# Patient Record
Sex: Male | Born: 1950 | Race: White | Hispanic: No | Marital: Married | Smoking: Former smoker
Health system: Southern US, Community
[De-identification: ages and names within clinical notes are randomized; demographics above are authoritative.]

## PROBLEM LIST (undated history)

## (undated) DIAGNOSIS — C801 Malignant (primary) neoplasm, unspecified: Secondary | ICD-10-CM

## (undated) DIAGNOSIS — F329 Major depressive disorder, single episode, unspecified: Secondary | ICD-10-CM

## (undated) DIAGNOSIS — G4733 Obstructive sleep apnea (adult) (pediatric): Secondary | ICD-10-CM

## (undated) DIAGNOSIS — I2699 Other pulmonary embolism without acute cor pulmonale: Secondary | ICD-10-CM

## (undated) DIAGNOSIS — R972 Elevated prostate specific antigen [PSA]: Secondary | ICD-10-CM

## (undated) DIAGNOSIS — R7303 Prediabetes: Secondary | ICD-10-CM

## (undated) DIAGNOSIS — G4730 Sleep apnea, unspecified: Secondary | ICD-10-CM

## (undated) DIAGNOSIS — H409 Unspecified glaucoma: Secondary | ICD-10-CM

## (undated) DIAGNOSIS — F32A Depression, unspecified: Secondary | ICD-10-CM

## (undated) DIAGNOSIS — K219 Gastro-esophageal reflux disease without esophagitis: Secondary | ICD-10-CM

## (undated) DIAGNOSIS — R51 Headache: Secondary | ICD-10-CM

## (undated) DIAGNOSIS — L409 Psoriasis, unspecified: Secondary | ICD-10-CM

## (undated) DIAGNOSIS — J302 Other seasonal allergic rhinitis: Secondary | ICD-10-CM

## (undated) DIAGNOSIS — I10 Essential (primary) hypertension: Secondary | ICD-10-CM

## (undated) DIAGNOSIS — Z77090 Contact with and (suspected) exposure to asbestos: Secondary | ICD-10-CM

## (undated) HISTORY — PX: APPENDECTOMY: SHX54

## (undated) HISTORY — PX: PROSTATE BIOPSY: SHX241

## (undated) HISTORY — PX: TONSILLECTOMY: SUR1361

## (undated) HISTORY — PX: EXTERNAL EAR SURGERY: SHX627

---
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
## 2000-08-09 HISTORY — PX: RETINAL DETACHMENT SURGERY: SHX105

## 2000-09-12 ENCOUNTER — Encounter: Payer: Self-pay | Admitting: Ophthalmology

## 2000-09-12 ENCOUNTER — Ambulatory Visit (HOSPITAL_COMMUNITY): Admission: RE | Admit: 2000-09-12 | Discharge: 2000-09-13 | Payer: Self-pay | Admitting: Ophthalmology

## 2002-05-04 ENCOUNTER — Ambulatory Visit (HOSPITAL_COMMUNITY): Admission: RE | Admit: 2002-05-04 | Discharge: 2002-05-04 | Payer: Self-pay | Admitting: *Deleted

## 2002-05-04 ENCOUNTER — Encounter (INDEPENDENT_AMBULATORY_CARE_PROVIDER_SITE_OTHER): Payer: Self-pay | Admitting: Specialist

## 2002-08-09 HISTORY — PX: RETINAL DETACHMENT SURGERY: SHX105

## 2002-10-25 ENCOUNTER — Encounter: Payer: Self-pay | Admitting: Ophthalmology

## 2002-10-25 ENCOUNTER — Ambulatory Visit (HOSPITAL_COMMUNITY): Admission: RE | Admit: 2002-10-25 | Discharge: 2002-10-26 | Payer: Self-pay | Admitting: Ophthalmology

## 2004-07-16 ENCOUNTER — Ambulatory Visit (HOSPITAL_COMMUNITY): Admission: RE | Admit: 2004-07-16 | Discharge: 2004-07-16 | Payer: Self-pay | Admitting: Orthopedic Surgery

## 2004-08-09 HISTORY — PX: SHOULDER ARTHROSCOPY: SHX128

## 2004-09-17 ENCOUNTER — Ambulatory Visit (HOSPITAL_BASED_OUTPATIENT_CLINIC_OR_DEPARTMENT_OTHER): Admission: RE | Admit: 2004-09-17 | Discharge: 2004-09-17 | Payer: Self-pay | Admitting: Orthopedic Surgery

## 2004-09-17 ENCOUNTER — Ambulatory Visit (HOSPITAL_COMMUNITY): Admission: RE | Admit: 2004-09-17 | Discharge: 2004-09-17 | Payer: Self-pay | Admitting: Orthopedic Surgery

## 2004-10-30 ENCOUNTER — Encounter: Admission: RE | Admit: 2004-10-30 | Discharge: 2004-10-30 | Payer: Self-pay | Admitting: Orthopedic Surgery

## 2006-08-09 HISTORY — PX: CARPAL TUNNEL WITH CUBITAL TUNNEL: SHX5608

## 2007-08-01 ENCOUNTER — Ambulatory Visit (HOSPITAL_BASED_OUTPATIENT_CLINIC_OR_DEPARTMENT_OTHER): Admission: RE | Admit: 2007-08-01 | Discharge: 2007-08-01 | Payer: Self-pay | Admitting: Orthopedic Surgery

## 2007-08-10 HISTORY — PX: CARPAL TUNNEL WITH CUBITAL TUNNEL: SHX5608

## 2008-07-30 ENCOUNTER — Ambulatory Visit (HOSPITAL_BASED_OUTPATIENT_CLINIC_OR_DEPARTMENT_OTHER): Admission: RE | Admit: 2008-07-30 | Discharge: 2008-07-30 | Payer: Self-pay | Admitting: Orthopedic Surgery

## 2009-10-26 ENCOUNTER — Emergency Department (HOSPITAL_BASED_OUTPATIENT_CLINIC_OR_DEPARTMENT_OTHER): Admission: EM | Admit: 2009-10-26 | Discharge: 2009-10-26 | Payer: Self-pay | Admitting: Emergency Medicine

## 2010-05-18 ENCOUNTER — Emergency Department (HOSPITAL_COMMUNITY): Admission: EM | Admit: 2010-05-18 | Discharge: 2010-05-19 | Payer: Self-pay | Admitting: Emergency Medicine

## 2010-10-21 LAB — CBC
MCH: 29.6 pg (ref 26.0–34.0)
MCV: 87.5 fL (ref 78.0–100.0)
Platelets: 243 10*3/uL (ref 150–400)
RBC: 4.79 MIL/uL (ref 4.22–5.81)
RDW: 13.1 % (ref 11.5–15.5)

## 2010-10-21 LAB — DIFFERENTIAL
Basophils Absolute: 0 10*3/uL (ref 0.0–0.1)
Basophils Relative: 0 % (ref 0–1)
Eosinophils Absolute: 0.2 10*3/uL (ref 0.0–0.7)
Eosinophils Relative: 3 % (ref 0–5)
Neutrophils Relative %: 52 % (ref 43–77)

## 2010-10-21 LAB — BASIC METABOLIC PANEL
BUN: 16 mg/dL (ref 6–23)
CO2: 26 mEq/L (ref 19–32)
Calcium: 9.1 mg/dL (ref 8.4–10.5)
Chloride: 103 mEq/L (ref 96–112)
Creatinine, Ser: 1.08 mg/dL (ref 0.4–1.5)
GFR calc Af Amer: >60 mL/min (ref >60)
GFR calc non Af Amer: >60 mL/min (ref >60)
Glucose, Bld: 97 mg/dL (ref 70–99)
Potassium: 3.9 mEq/L (ref 3.5–5.1)
Sodium: 136 mEq/L (ref 135–145)

## 2010-10-21 LAB — POCT CARDIAC MARKERS
Myoglobin, poc: 44.6 ng/mL (ref 12–200)
Troponin i, poc: <0.05 ng/mL (ref 0.00–0.09)

## 2010-12-22 NOTE — Op Note (Signed)
NAMEDRAVYN, SEVERS          ACCOUNT NO.:  [id number]   MEDICAL RECORD NO.:  [id number]          PATIENT TYPE:  AMB   LOCATION:  DSC                          FACILITY:  [facility]   PHYSICIAN:  Katy Fitch. Sypher, M.D. DATE OF BIRTH:  [birth year]   DATE OF PROCEDURE:  08/01/2007  DATE OF DISCHARGE:                               OPERATIVE REPORT   PREOPERATIVE DIAGNOSES:  1. 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  2. Right carpal tunnel syndrome.   POSTOPERATIVE DIAGNOSES:  1. 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  2. Right carpal tunnel syndrome.   OPERATION:  1. Decompression of right ulnar nerve in situ right cubital tunnel      with partial resection of medial head of triceps and triceps      aponeurosis  2. Right carpal tunnel release at wrist.   OPERATING SURGEON:  Katy Fitch. Sypher, M.D.   ASSISTANT:  None.   ANESTHESIA:  General by LMA.   SUPERVISING ANESTHESIOLOGIST:  Bedelia Person, M.D.   INDICATIONS:  Carin Hock as an established patient who has had a  history of bilateral hand numbness.  He returned in the fall of 2008 for  evaluation of his upper extremities and was noted to have signs of  carpal tunnel syndrome and possible ulnar neuropathy at the cubital  tunnel.   We asked Dr. Wadie Lessen, a experienced physiatrist, to perform  electrodiagnostic studies.  Electrodiagnostic studies confirmed  bilateral carpal tunnel syndrome and bilateral ulnar neuropathy at the  cubital tunnel.   Due to a failure to respond to nonoperative measures, Mr. Highfill was  brought to the operating at this time for release of his right  transverse carpal ligament and decompression of his right ulnar nerve at  the medial cubital tunnel region of the right elbow.  After informed  consent he is brought to the operating room at this time.   PROCEDURE:  Demetrus Pavao. Zoss was brought to the operating room and  placed in supine  position upon the operating table.   Following an anesthesia consult with Dr. Gypsy Balsam, general anesthesia by  LMA technique was recommended and accepted.   Mr. Ryland was brought to room #6, placed in supine position upon  the operating table and under Dr. Burnett Corrente direct supervision, general  anesthesia by LMA technique induced.   His right arm was prepped with Betadine soap and solution and sterilely  draped.  A pneumatic tourniquet was applied to the proximal right  brachium.   Following exsanguination of the right arm with an Esmarch bandage, an  arterial tourniquet in the proximal brachium was inflated to 220 mmHg.  Procedure commenced with a short incision in line of the ring finger and  the palm.  Subcutaneous tissues were carefully divided revealing the  palmar fascia.  This was split longitudinally to reveal the common  sensory branch of the median nerve.   These were followed back to the transverse carpal ligament which was  gently isolated from median nerve.   The transverse carpal ligament was then released along its ulnar border  to the level of the  distal wrist flexion crease.   At this level, we identified an aberrant muscle and a tendon that  appeared to be crossing and blending with the palmaris longus.   Given this anatomic variant, I elected to create a second proximal  incision to study the anatomy prior to release of the transverse carpal  ligament proximally.   The variant of a palmaris brevis was identified and released under  direct vision followed by release of the volar forearm fascia and the  proximal portion of the transverse carpal ligament.   The median nerve and contents of the ulnar bursa were fully  decompressed.   Bleeding points along the margin of the released ligament were  electrocauterized with bipolar current followed by repair of the two  wounds with intradermal 3-0 Prolene and Steri-Strips.  There was 2%  lidocaine infiltrated for  postoperative analgesia along the wound  margins.   Attention was then directed to the medial right elbow.   A 2 cm incision was fashioned directly over the ulnar nerve.  Subcutaneous tissues were carefully divided taking care to clear the  bursa from the incision.  The ulnar nerve was identified by palpation  posterior to the epicondyle, followed by meticulous release of the  arcuate ligament and the fascia at the heads of the flexor carpi ulnaris  as well as multiple fibrous bands distally deep to the flexor carpi  ulnaris.  The nerve was decompressed 6 cm above the epicondyle.   It appeared that the primary compression was at the arcuate ligament and  beneath the flexor carpi ulnaris were there were rather significant  fibrous bands deep to the muscle.   The medial head of the triceps was inspected and found to cause anterior  roll of the nerve with elbow flexion.  A portion of the medial head and  triceps tendon approximately 3 mm wide was resected over a distance of 3  cm preventing the anterior roll of the nerve.   Bleeding points were electrocauterized with bipolar current followed by  repair of the skin wound with subdermal sutures of 4-0 Vicryl and  intradermal 3-0 Prolene with Steri-Strips.   Mr. Lieb tolerated the surgery and anesthesia well.  She was  transferred to recovery room with stable vital signs.   We will see him back in our office for follow-up in 7-8 days for  dressing change and initiation of a range of motion program.   He is provided a note to work, stating he cannot work for the next three  weeks.  I will see him back for follow-up sooner p.r.n. problems.  He is  provided prescription for Percocet 5 mg one or two tablets p.o. q.4-6h.  p.r.n. pain, 24 tablets without refill.Katy Fitch Sypher, M.D.  Electronically Signed     RVS/MEDQ  D:  08/01/2007  T:  08/01/2007  Job:  [id number]   cc:   Quita Skye. Artis Flock, M.D.

## 2010-12-22 NOTE — Op Note (Signed)
NAMEYAKIR, Anthony [name]          ACCOUNT NO.:  [id number]   MEDICAL RECORD NO.:  [id number]          PATIENT TYPE:  AMB   LOCATION:  DSC                          FACILITY:  [facility]   PHYSICIAN:  Katy Fitch. Sypher, M.D. DATE OF BIRTH:  [birth year]   DATE OF PROCEDURE:  07/30/2008  DATE OF DISCHARGE:                               OPERATIVE REPORT   PREOPERATIVE DIAGNOSES:  1. 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  2. Chronic left carpal tunnel syndrome.  3. 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   POSTOPERATIVE DIAGNOSES:  1. 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  2. Chronic left carpal tunnel syndrome.  3. 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   OPERATION:  1. Decompression of right ulnar nerve at cubital tunnel with partial      resection of medial head of triceps.  2. Left carpal tunnel release through separate incision, left wrist.  3. Release of right ring finger A1 pulley.   OPERATING SURGEON:  Katy Fitch. Sypher, MD   ASSISTANT:  No assistant.   ANESTHESIA:  General by LMA.   SUPERVISING ANESTHESIOLOGIST:  Quita Skye. Krista Blue, MD   INDICATIONS:  Anthony [name] is a 60 year old gentleman employed at  [employer].  He has a history of significant left arm numbness.  He had  preoperative electrodiagnostic studies which demonstrated bilateral  carpal tunnel syndrome and bilateral cubital tunnel syndrome.  He is  status post decompression of his right ulnar nerve cubital tunnel and  right carpal tunnel release with excellent relief performed on August 01, 2007.  He now presents for identical surgery on the left.   In addition to his left arm predicament, he was noted to have active  triggering of his right ring finger at the A1 pulley.  He requested that  all of these predicaments be addressed at the same anesthetic.  After  informed consent, he is brought to the operating at this  time.  Preoperatively, he was reminded of the potential risks and benefits of  surgery.  He understands the aftercare plan from his experience on the  right side.  After informed consent, he is brought to the operating room  at this time.   PROCEDURE:  Anthony [name] was brought to the operating room and  placed in supine position on the operating table.   Following the induction of general anesthesia by LMA technique, the left  arm was prepped with Betadine soap solution and sterilely draped.  A  pneumatic tourniquet was applied proximal left brachium.  Upon  exsanguination of left arm with an Esmarch bandage, arterial tourniquet  was inflated to 250 mmHg.  Procedure commenced with a 3-cm incision  directly over path of the ulnar nerve at medial left elbow.  Subcutaneous tissues were carefully divided taking care to identify and  retract the posterior branch of the medial antebrachial cutaneous nerve.  The ulnar nerve was noted to be extruding anterior to the triceps tendon  with elbow flexion beyond 85 degrees.  The ulnar nerve was decompressed  from 6 cm above the epicondyle to 6  cm distally.  There was profound  compression at the arcuate ligament due to anterior roll of the nerve  due to the triceps belly.   After decompression of the nerve, the groove was inspected and found to  be adequate to maintain the nerve in situ.  There was no tendency to the  nerve to roll forward after the triceps had been released.  A strip of  triceps medial head measuring 1 cm in width was harvested from its  insertion at the olecranon proximally over a distance of 3.5 cm.  This  prevented anterior roll of the nerve with elbow flexion.   Bleeding points were electrocauterized with bipolar current followed by  repair of the skin with subcutaneous suture of 4-0 Vicryl and  intradermal 3-0 Prolene segmental sutures.  Steri-Strips were applied.  The wound was infiltrated with 2% lidocaine for  postoperative analgesia.   Attention was then directed to the right palm.  A short incision was  fashioned in line of the ring finger in the palm.  Subcutaneous tissues  were carefully divided in the palmar fascia.  This split longitudinally  to reveal the common sensory branch of the median nerve.  These were  followed back to the median nerve proper which was gently isolated from  median nerve.  The ligaments was then gently released with scissors  along its ulnar border extending into the distal forearm.  This widely  opened carpal canal.  No mass or predicaments were noted.  Bleeding  points along the margin of released ligament were electrocauterized with  bipolar current.  The wound was then repaired with intradermal 3-0  Prolene suture.   A compressive dressing was applied with a volar plaster splint  maintaining the wrist in 5 degrees of dorsiflexion.  The elbow was  dressed with sterile gauze, Tegaderm, and an Ace wrap.   The tourniquet was released on the left side with immediate capillary  refill of the fingers and thumb.  Attention was then directed to the  right hand.  The hand and wrist were exsanguinated with an Esmarch  bandage used as a distal forearm tourniquet.  The fingers were placed in  a lead hand followed by a short transverse incision directly over the A1  pulley of the ring fingers.  Subcutaneous tissues were carefully divided  taking care to release fascial adhesions.  The A1 pulley was isolated  and split with a scalpel and scissors.  A small A0 pulley was identified  and release.  Thereafter, free range of motion of the ring finger was  recovered.  The wound was repaired with mattress suture of 3-0 Prolene.   Steri-Strips applied followed by intradermal injection of lidocaine.  The wound was dressed with sterile gauze and an Ace wrap.  There were no  apparent complications.  For aftercare, Mr. Trebilcock was provided with  prescription for Percocet 5 mg 1  tablet p.o. 4-6 hours p.r.n. pain, 24  tablets without refill.  He is also provided a prescription for Keflex  500 mg 1 p.o. q.8 h x4 days as prophylactic antibiotic, 12 tablets  without refill.       Katy Fitch Sypher, M.D.  Electronically Signed     RVS/MEDQ  D:  07/30/2008  T:  07/31/2008  Job:  [id number]

## 2010-12-25 NOTE — Op Note (Signed)
NAMEKREGG, CIHLAR                    ACCOUNT NO.:  [id number]   MEDICAL RECORD NO.:  [id number]                   PATIENT TYPE:  OIB   LOCATION:  5738                                 FACILITY:  [facility]   PHYSICIAN:  Guadelupe Sabin, M.D.             DATE OF BIRTH:  1950-12-16   DATE OF PROCEDURE:  10/25/2002  DATE OF DISCHARGE:  10/26/2002                                 OPERATIVE REPORT   PREOPERATIVE DIAGNOSIS:  Rhegmatogenous retinal detachment, right eye.   POSTOPERATIVE DIAGNOSIS:  Rhegmatogenous retinal detachment, right eye.   PROCEDURES:  1. Scleral buckling procedure, right eye, using solid silicone implants #277     and 240.  2. Cryotherapy application.  3. Diathermy application.  4. Paracentesis.   SURGEON:  Guadelupe Sabin, M.D.   ASSISTANT:  Nurse.   ANESTHESIA:  General.   OPHTHALMOSCOPY:  As previously described.   DESCRIPTION OF PROCEDURE:  After the patient was prepped and draped, the lid  speculum and lid traction sutures were placed in the right upper and lower  lids.  A peritomy was performed 360 degrees.  The subconjunctival tissue was  cleaned and the sclera inspected.  It was felt that it was in satisfactory  condition for the proposed lamellar scleral dissection.  Localization of the  small retinal detachment was noted and cryoapplications applied around the  tear at the 12:30 position.  It was then elected to perform lamellar scleral  dissection from the 2 to 11 o'clock position, the bed measuring 9 mm in  width.  The patient was given Diamox 500 mg intravenously to lower the  intraocular pressure.  Following lamellar scleral dissection, light  diathermy applications were applied to the inner scleral lamella.  A total  of three 5-0 Mersilene sutures were used to close the scleral flaps over a  trimmed #277 solid silicone implant.  A #240 solid silicone encircling band  was placed about the globe, tied with two sutures at the 8 o'clock  position.  Anchoring sutures were placed at the 8 and 4 o'clock position to hold the  encircling band in place.  Due to the small amount of subretinal fluid, it  was elected to perform a paracentesis to further lower the intraocular  pressure.  This was performed on two occasions as the scleral flaps were  pulled up and the tension of the encircling band adjusted.  Repeat indirect  ophthalmoscopy revealed that the retinal detachment was entirely placed on  the implant surface and the tear appeared to be in satisfactory position.  The old retinal horseshoe tear at the 9 o'clock position was slightly  posterior to the encircling band but was completely sealed with the previous  laser photocoagulation scars.  It was then elected to close.  Tenon's  capsule was pulled forward in the four quadrants and tied as a separate  layer.  The conjunctiva was then pulled forward and closed with a running 6-  0 catgut suture.  Depo-Garamycin and Depo-Dexamethasone were injected in the  sub-Tenon's space inferiorly.  Maxitrol and  atropine ointment were instilled in the conjunctival cul-de-sac.  A light  patch and protective shield were applied to the operative right eye.  Duration of procedure:  One to 1-1/2 hours.  The patient tolerated the  procedure well in general, left the operating room for the recovery room and  subsequently to the 23-hour observation unit.                                               Guadelupe Sabin, M.D.    HNJ/MEDQ  D:  10/27/2002  T:  10/29/2002  Job:  [id number]

## 2010-12-25 NOTE — Op Note (Signed)
NAMEDIAMOND, MARTUCCI          ACCOUNT NO.:  [id number]   MEDICAL RECORD NO.:  [id number]          PATIENT TYPE:  AMB   LOCATION:  DSC                          FACILITY:  [facility]   PHYSICIAN:  Katy Fitch. Sypher Montez Hageman., M.D.DATE OF BIRTH:  04-03-1951   DATE OF PROCEDURE:  09/17/2004  DATE OF DISCHARGE:                                 OPERATIVE REPORT   PREOPERATIVE DIAGNOSIS:  Chronic pain, left shoulder with evidence of  acromioclavicular arthropathy and chronic stage II or III impingement with  MRI evidence of a significant deep surface and degenerative rotator cuff  tear of the supraspinatus and infraspinatus.   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 Also diagnostic arthroscopy  revealed mild adhesive capsulitis with considerable intra-articular synovial  granulations.   OPERATION:  1.  Diagnostic arthroscopy of left glenohumeral joint with debridement of      minor adhesive capsulitis granulations and labral debris.  2.  Subacromial decompression with bursectomy, coracoacromial ligament      release and acromioplasty.  3.  Open resection of distal clavicle.  4.  Open repair of supraspinatus and infraspinatus degenerative deep surface      tear.   OPERATING SURGEON:  Katy Fitch. Sypher, M.D.   ASSISTANT:  Jonni Sanger, P.A.   ANESTHESIA:  General endotracheal supplemented by interscalene block.   SUPERVISING ANESTHESIOLOGIST:  Dr. Katrinka Blazing.   ESTIMATED BLOOD LOSS:  30 cc.  There were no apparent complications.   INDICATIONS:  Anthony [name] is a 60 year old right-hand dominant man  referred by Dr. Willow Ora for evaluation and management of a painful left  shoulder.  Clinical examination suggested chronic subacromial and sub AC  joint impingement. An MRI was obtained that documented a substantial deep  surface  degenerative rotator cuff tear that was proximally 90% of the  thickness of the supraspinatus, and infraspinatus with a very thin bursal  side remnant remaining.   Anthony [name] has failed nonoperative measures. Therefore he is brought up  this time for diagnostic arthroscopy to carefully evaluate his cuff tear  predicament, followed by anticipated subacromial decompression and possible  open repair the rotator cuff.   PROCEDURE:  Labaron morphine was brought to operating room and placed supine  position on the table. Following interscalene block in the holding area,  anesthesia was complete in the left upper extremity and forequarter.  Following the induction of general orotracheal anesthesia he was carefully  positioned in the beach-chair position with the aid of a torso and head  holder designed for shoulder arthroscopy.  The entire left arm and fore  quarter were prepped with DuraPrep and draped with impervious arthroscopy  drapes.   The procedure commenced with distension of the left shoulder with 20 cc of  sterile saline with a spinal needle brought in anteriorly. The scope was  placed in standard posterior portal.  Diagnostic arthroscopy revealed intact  hyaline articular cartilage surfaces on the glenoid and humeral head. The  anterior capsule ligaments were normal. The superior and inferior recesses  were notable  for a moderate degree of adhesive capsulitis granulation  tissues.  The granulations were debrided with a suction shaver brought in  anteriorly.  The deep surface rotator cuff was carefully inspected. There  was a necrotic degenerative tear of the supraspinatus and anterior  infraspinatus extending approximately 3 cm posterior to the bicipital  interval.  The biceps tendon had a normal anchor at the glenoid labrum and  was normal through the rotator interval.  After debridement the  granulations, hemostasis was achieved with the ArthroCare radiofrequency  probe.  The  scope was then placed in subacromial space. There were  considerable bursal adhesions noted. These were taken down with a suction  shaver and hemostasis was achieved with the ArthroCare probe.   The [facility] joint capsule identified and taken down with the cutting cautery  followed by leveling of the acromion to a type 1 morphology. We initiated a  distal clavicle resection arthroscopically.  However, due to the morphology  of the [facility] joint that was rather notable for the distal clavicle being rather  oblique with the distal and dorsal surface being rather lateralized, I  elected to proceed ultimately with open resection to assure a proper  resection of the distal clavicle.   A 6 cm incision was fashioned from the [facility] joint across the anterior acromion  longitudinally. The anterior third of the deltoid was elevated  subperiosteally off of the acromion and the [facility] joint capsule was incised  with a 15 scalpel blade and the distal 15 mm of clavicle visualized.   An oscillating saw was used to remove the distal 50 mm clavicle. The capsule  was debrided as were the remnants of the meniscus.  The bursa was incised to  reveal the rotator cuff and a full-thickness tear was noted at the central  portion of supraspinatus with significant undermining and a rather large  tear extending from the bicipital interval posteriorly across the entire  supraspinatus into the anterior fibers of the infraspinatus.   The deep surface of the tendon was debrided with a rongeur as well as prior  debridement with the arthroscopic suction shaver.  The greater tuberosity  was decorticated over a width of 3 cm with a power bur followed by placement  of two Biocorkscrew 5.5 mm anchors to create a medial foot print, one for  the supraspinatus and one at the junction of the supraspinatus and  infraspinatus.  The greater tuberosity was cube steaked with an osteotome as the cortical bone at this level was extremely dense. The  rotator cuff was  gathered with a baseball stitch of #2 FiberWire that was placed with through  bone tunnels in the manner McLaughlin followed by placement of the four  mattress sutures from the Biocorkscrew anchors along the medial foot print  of the infraspinatus and supraspinatus repair, insetting the tendon in a  very satisfactory manner four approximately 50 mm wide footprint.   After the [facility] suture was tensioned, it was used as an over-the-top  __________ suture to inset the margin of the supraspinatus, creating a low  profile repair.  A hand rasp was used to fine tune the acromioplasty  laterally followed by irrigation of the entire bursa and subacromial space.  The wound was then closed with multiple mattress sutures of #2 FiberWire  reconstructing the anterior deltoid to the trapezius muscle closing the dead  space created by distal clavicle resection followed by repair of the  anterior third of the deltoid with through-bone sutures in the  anterior  acromion utilizing #2 FiberWire.  A small deltoid split laterally measuring  1.5 cm was repaired with simple suture of 0 Vicryl.  The skin was repaired  with subdermal sutures of 3-0 Vicryl and intradermal 3-0 Prolene with Steri-  Strips.   There were no apparent complications.   Anthony [name] tolerated surgery and anesthesia well.  He was transferred to  recovery room with stable signs.      RVS/MEDQ  D:  09/17/2004  T:  09/17/2004  Job:  [id number]   cc:   Wanda Plump, MD [facility]  [phone] [address] [place], [place] [address]

## 2010-12-25 NOTE — Discharge Summary (Signed)
   Anthony [name], Anthony [name]                    ACCOUNT NO.:  [id number]   MEDICAL RECORD NO.:  [id number]                   PATIENT TYPE:  OIB   LOCATION:  5738                                 FACILITY:  [facility]   PHYSICIAN:  Guadelupe Sabin, M.D.             DATE OF BIRTH:  January 16, 1951   DATE OF ADMISSION:  10/25/2002  DATE OF DISCHARGE:  10/26/2002                           DISCHARGE SUMMARY - REFERRING   HISTORY OF PRESENT ILLNESS:  This was an urgent outpatient admission of this  60 year old white male admitted with a rhegmatogenous retinal detachment of  the right eye.  The patient had had a previous rhegmatogenous retinal  detachment of the left eye repaired with scleral buckling in February 2002.  He also more recently had a horseshoe retinal tear treated with laser  photocoagulation of the right eye on 10/12/02.   HOSPITAL COURSE:  The patient was evaluated preoperatively and felt to be in  satisfactory condition for the proposed surgery.  He therefore was taken  into the operating room, where a scleral buckling procedure was performed on  the right eye using solid silicone implants #277 and 240 with  cryoapplication, diathermy application, and paracentesis.  The patient  tolerated the procedure well under general anesthesia and was taken to the  recovery room and subsequently to the 23-hour observation unit.  The patient  was examined on the evening of surgery and the following morning and felt to  be in satisfactory condition.  The retinal tear and the retinal detachment  were flattened on the implant surface.  It was felt that the patient had  achieved maximum hospital benefit and that he could be discharged home to be  followed in the office.  The patient was given a printed list of discharge  instructions on the care and use of the operated eye.   DISCHARGE MEDICATIONS:  Discharge operative medications included TobraDex  and Cyclomydril ophthalmic solutions, one drop four  times a day five minutes  apart, and Maxitrol and atropine ointment at bedtime.   CONDITION ON DISCHARGE:  Improved.   DISCHARGE DIAGNOSES:  1. Rhegmatogenous retinal detachment, right eye.  2. Old retinal detachment, repaired in 2002, left eye.                                               Guadelupe Sabin, M.D.    HNJ/MEDQ  D:  10/27/2002  T:  10/29/2002  Job:  [id number]   cc:   [name], M.D.  [address], [address]  [place]  [place] [address]  Fax: [phone]

## 2010-12-25 NOTE — H&P (Signed)
[place]. [facility]  Patient:    Anthony [name], Anthony [name]                MRN: [id number] Adm. Date:  09/12/00 Attending:  Guadelupe Sabin, M.D. CC:         [name], M.D.                         History and Physical  CHIEF COMPLAINT: This is a planned urgent outpatient admission for this 60 year old white male, admitted with a rhegmatogenous retinal detachment of the left eye.  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 to each eye.  Suddenly approximately one week ago the patient began to note the onset of floaters and light flashes in his left eye.  The patient was seen by Dr. [name], who felt the patient was having vitreous traction but no retinal tears were noted.  Suddenly the patient began to note the onset of a visual field defect in the left eye.  The patient was seen by Dr. [name] the morning of surgery, who noted a retinal detachment.  The patient was referred to my office, where this diagnosis was confirmed and the patient admitted for surgical care.  PAST MEDICAL HISTORY: The patient is in stable general health at the present time.  He has a previous history of:  1. Kidney stones.  2. Acne rosacea.  3. Migraine headaches.  PAST SURGICAL HISTORY:  1. Appendectomy.  2. Tonsillectomy.  CURRENT MEDICATIONS:  1. Flonase nasal spray.  2. Timoptic ophthalmic solution.  REVIEW OF SYSTEMS: The patients wife states that at one time the patient had PACs, which was remedied or cured with decrease in caffeine intake.  He is said to have in childhood a silent murmur.  He denies any specific, however, chest pain, shortness of breath, ankle edema, or palpitations.  PHYSICAL EXAMINATION:  VITAL SIGNS: As recorded on admission.  GENERAL: The  patient is an alert, well-developed, well-nourished 60 year old white male in acute ocular distress.  HEENT: Visual acuity with myopic correction 20/40 right eye, 20/40 -2 left eye.  Applanation tenometry 21 mm right eye, 16 mm left eye.  External ocular and slit lamp examination shows the eyes are white and clear with a clear cornea, deep and clear anterior chamber, clear lens.  Funduscopic examination of the right eye is normal.  No peripheral retinal tear or detachment areas are noted.  The left eye reveals a clear vitreous with a superior rhegmatogenous retinal detachment.  At the 12:30 oclock position is a small horseshoe retinal tear at the end of an area of peripheral retinal lattice retinal degeneration.  The macular area is still attached.  CHEST: Lungs clear to auscultation and percussion.  HEART: Normal sinus rhythm.  No cardiomegaly.  No murmurs.  ABDOMEN: Negative.  EXTREMITIES: Negative.  ADMISSION DIAGNOSIS: Rhegmatogenous retinal detachment, left eye.  SURGICAL PLAN: Scleral buckling procedure, left eye, with possible vitrectomy. The patient and his wife have been given oral discussion and printed information concerning retinal detachment surgery, causes, complications, and need for reoperation or failure to reattach the retina.  They have signed an informed consent and arrangements made for his urgent outpatient admission.DD: 09/12/00 TD:  09/13/00 Job: [id number]

## 2010-12-25 NOTE — Op Note (Signed)
[place]. [facility]  Patient:    LIO, WEHRLY                MRN: [id number] Proc. Date: 09/12/00 Attending:  Guadelupe Sabin, M.D. CC:         [name], M.D.                           Operative Report  PREOPERATIVE DIAGNOSIS: Rhegmatogenous retinal detachment, left eye.  POSTOPERATIVE DIAGNOSIS: Rhegmatogenous retinal detachment, left eye.  OPERATION/PROCEDURE: Scleral buckling procedure, left eye, using solid silicone implants, #277 and #240, cryo application, diathermy application, paracentesis, without drainage of subretinal fluid, intravitreal C3F8 injection.  SURGEON: Guadelupe Sabin, M.D.  ASSISTANT: Nurse.  ANESTHESIA: General.  OPHTHALMOSCOPY: As previously described.  DESCRIPTION OF PROCEDURE: After the patient was prepped and draped a lid speculum was inserted in the left eye and a peritomy performed adjacent to the limbus 360 degrees.  The rectus muscles were isolated with 4-0 silk traction sutures.  The sclera inspected and felt to be in satisfactory condition for lamellar scleral dissection.  Localization of the retinal horseshoe tear was performed.  The tear was localized under the superior rectus muscle at the 12 oclock position just behind its insertion.  It was then elected to perform a lamellar scleral dissection from the 10 oclock to 2 oclock position, measuring 9 mm in width.  Light diathermy applications were applied to the inner lamella.  The tear itself had been treated with direct cryo application during localization.  The patient was given Diamox 500 mg at the beginning of the procedure to lower the intraocular pressure.  A total of three 4-0 green Mersilene sutures were used to close the lamellar scleral flaps over a trimmed #277 solid silicone implant.  A #240 solid silicone encircling band was placed about the globe at the equator, tied with two sutures at the 5 oclock position.  Anchoring sutures  were placed at the 4:30 oclock and 8 oclock position to hold the encircling band in place.  Due to the relatively small amount of subretinal fluid present it was elected not to drain the fluid. Therefore two paracentesis were performed during the procedure to lower the intraocular pressure by evacuating partially the anterior chamber aqueous. This satisfactorily lowered the intraocular pressure and the scleral flaps were pulled up and tied.  The fundus was inspected intermittently.  There was always good pulsation and flow of the optic nerve blood vessels.  It was then elected to inject 0.3 cc of 100% C3F8 into the vitreous cavity to act as an intravitreal tamponade of the retinal tear at the 12 oclock position.  A site was selected at the 8 oclock position 3-3.5 mm from the limbus.  The 0.3 cc was then injected from a 30 gauge needle from a tuberculin syringe.  A good single bubble was achieved.  The intraocular pressure elevated slightly and once again the fundus was monitored for good optic nerve perfusion.  It was then elected to close.  Tenons capsule was pulled forward in the four quadrants and tied as a separate layer with a 6-0 chromic catgut suture.  The conjunctiva was then pulled forward and closed with a running 6-0 chromic catgut suture.  Depo-Garamycin and dexamethasone were injected in the subtenon space inferiorly.  Neosporin ophthalmic solution had been used in the irrigation solution during the period prior to the paracentesis and continued throughout the latter portion  of the surgery.  Maxitrol and Atropine ointment were instilled in the conjunctival cul-de-sac.  Schiotz tenometry was recorded at the end of the procedure at 1-2 scale units with a 5.5 g weight.  Once again at the end of the procedure the optic nerve blood vessels were perfusing as noted by indirect ophthalmoscopy.  The patient was placed on his back with the head elevated and taken to the recovery room and  subsequently to the 23 hour observation unit. DD:  09/12/00 TD:  09/13/00 Job: [id number]

## 2010-12-25 NOTE — H&P (Signed)
[name], [name]                    ACCOUNT NO.:  [id number]   MEDICAL RECORD NO.:  [id number]                   PATIENT TYPE:  OIB   LOCATION:  2876                                 FACILITY:  [facility]   PHYSICIAN:  Guadelupe Sabin, M.D.             DATE OF BIRTH:  1950-11-24   DATE OF ADMISSION:  10/25/2002  DATE OF DISCHARGE:                                HISTORY & PHYSICAL   REASON FOR ADMISSION:  This was an urgent outpatient readmission of this 60-  year-old white male admitted with a rhegmatogenous retinal detachment of the  right eye.   PRESENT ILLNESS:  This patient has a history of a retinal detachment  occurring in his left eye.  He was admitted on September 12, 2000 as an  outpatient, at which time a scleral buckling procedure was performed on the  left eye under general anesthesia without complications.  Retinal  reattachment was achieved and visual acuity has remained at 20/20 in the  operated left eye.  Recently, the patient began to note the sudden onset of  floaters in his right eye.  He was seen in the office on October 12, 2002 and  the diagnosis of a horseshoe retinal tear was made in the right eye at the 9  o'clock position.  Office laser photocoagulation was performed to surround  the retinal break.  The patient again was seen and there appeared to be good  laser reaction around the retinal tear.  On the day prior to admission,  October 24, 2002, the patient noted the sudden onset of recurrence of his  flashing lights in his right eye.  Patient was reexamined and found to have  a small localized retinal detachment at the 2 o'clock position with a small  horseshoe retinal tear.  It was felt that retinal detachment surgery was  indicated, as there was too much subretinal fluid to do laser  photocoagulation.  The patient was given oral discussion and printed  information concerning the procedure and its possible complications.  He  signed an informed consent  and arrangements were made for his outpatient  admission at this time.   PAST MEDICAL HISTORY:  The patient is in good general health, under the care  of Dr. Onalee Hua B. Georgina Pillion, taking Flonase and Lexapro.  He also takes timolol  ophthalmic solution, one drop in both eyes for glaucoma control under the  care of Dr. [name].   REVIEW OF SYSTEMS:  No cardiorespiratory complaints.   PHYSICAL EXAMINATION:  VITAL SIGNS:  As recorded.  GENERAL APPEARANCE:  The patient is a pleasant, well-nourished, well-  developed white male in acute ocular distress.  HEENT:  Eyes:  Visual acuity with best correction, 20/20 -- right eye, 20/20  -- left eye.  Applanation tonometry on medication -- 14 mm, each eye.  External ocular and slitlamp examination normal.  Detailed fundus  examination:  The vitreous is slightly hazy.  The retina is attached, except  in the upper nasal quadrant where a small localized retinal detachment is  present with a horseshoe retinal tear.  The previously treated horseshoe  tear at the 9 o'clock position appears sealed with laser photocoagulation.  The macular area is still attached.  CHEST:  Lungs clear to percussion and auscultation.  HEART:  Normal sinus rhythm.  No cardiomegaly.  No murmurs.  ABDOMEN:  Negative.  EXTREMITIES:  Negative.    ADMISSION DIAGNOSES:  1. Rhegmatogenous retinal detachment, right eye.  2. Status post previous scleral buckling for retinal detachment, left eye.   SURGICAL PLAN:  Scleral buckling procedure, right eye.                                               Guadelupe Sabin, M.D.    HNJ/MEDQ  D:  10/25/2002  T:  10/26/2002  Job:  [id number]   cc:   [name], M.D.  [address], [address]  [place]  [place] [address]  Fax: [phone]   Oley Balm. Georgina Pillion, M.D.  [address]  [place]  [place] [address]  Fax: [phone]

## 2011-05-13 LAB — POCT HEMOGLOBIN-HEMACUE: Hemoglobin: 14 g/dL (ref 13.0–17.0)

## 2011-05-14 LAB — POCT HEMOGLOBIN-HEMACUE
Hemoglobin: 13.7
Operator id: 12362

## 2012-10-01 ENCOUNTER — Ambulatory Visit (HOSPITAL_BASED_OUTPATIENT_CLINIC_OR_DEPARTMENT_OTHER): Payer: 59 | Attending: Otolaryngology

## 2012-10-01 VITALS — Ht 67.0 in | Wt 172.0 lb

## 2012-10-01 DIAGNOSIS — G4733 Obstructive sleep apnea (adult) (pediatric): Secondary | ICD-10-CM | POA: Insufficient documentation

## 2012-10-01 DIAGNOSIS — I491 Atrial premature depolarization: Secondary | ICD-10-CM | POA: Insufficient documentation

## 2012-10-07 DIAGNOSIS — G4733 Obstructive sleep apnea (adult) (pediatric): Secondary | ICD-10-CM

## 2012-10-07 DIAGNOSIS — R0609 Other forms of dyspnea: Secondary | ICD-10-CM

## 2012-10-07 DIAGNOSIS — R0989 Other specified symptoms and signs involving the circulatory and respiratory systems: Secondary | ICD-10-CM

## 2012-10-07 DIAGNOSIS — G4761 Periodic limb movement disorder: Secondary | ICD-10-CM

## 2012-10-07 NOTE — Procedures (Signed)
Anthony [name], Anthony [name]          ACCOUNT NO.:  [id number]  MEDICAL RECORD NO.:  [id number]          PATIENT TYPE:  OUT  LOCATION:  SLEEP CENTER                 FACILITY:  [facility]  PHYSICIAN:  Clinton D. Maple Hudson, MD, FCCP, FACPDATE OF BIRTH:  1951-02-03  DATE OF STUDY:  10/01/2012                           NOCTURNAL POLYSOMNOGRAM  REFERRING PHYSICIAN:  Antony Contras, MD  INDICATION FOR STUDY:  Insomnia with sleep apnea.  EPWORTH SLEEPINESS SCORE:  4/24.  BMI 26.9, weight 172 pounds, height 67 inches, neck 16.5 inches.  MEDICATIONS:  Home medications are charted and reviewed.  SLEEP ARCHITECTURE:  Total sleep time 148.5 minutes with sleep efficiency 40.3%.  Stage I was 26.9%, stage II 73.1%, stage III absent, REM absent.  Sleep latency 67.5 minutes, awake after sleep onset 142 minutes.  Arousal index 9.3.  BEDTIME MEDICATION:  Melatonin.  RESPIRATORY DATA:  Apnea-hypopnea index (AHI) 16.2 per hour.  A total of 40 events was scored including 9 obstructive apneas and 31 hypopneas. Events were more common while non-supine.  There were insufficient numbers of early events to meet protocol requirements for split CPAP titration.  OXYGEN DATA:  Mild snoring with oxygen desaturation to a nadir of 88% and mean oxygen saturation through the study of 94% on room air.  CARDIAC DATA:  Sinus rhythm with PACs.  MOVEMENT-PARASOMNIA:  A total of 26 limb movements were counted, of which, 3 were associated with arousals or awakenings for periodic limb movement with arousal index of 1.2 per hour.  No bathroom trips.  IMPRESSIONS-RECOMMENDATIONS: 1. Sleep architecture was significant for repeated sustained intervals     of wakefulness throughout the night, difficulty initiating and     maintaining sleep.  Melatonin was taken at bedtime. 2. Moderate obstructive sleep apnea/hypopnea syndrome, AHI 16.2 per     hour.  Events were more common while non-supine.  Mild snoring with     oxygen  desaturation to a nadir of 88% and mean oxygen saturation     through the study of 94% on room air.  There were insufficient     numbers of early events to permit application of split protocol     CPAP titration on this study. 3. Periodic limb movement with arousal syndrome.  A total of 26 limb     jerks were associated with 3 arousals for an arousal index of 1.2     per hour.     Clinton D. Maple Hudson, MD, Tonny Bollman, FACP Diplomate, American Board of Sleep Medicine    CDY/MEDQ  D:  10/07/2012 10:56:44  T:  10/07/2012 22:30:26  Job:  [id number]

## 2012-12-11 ENCOUNTER — Encounter: Payer: Self-pay | Admitting: Internal Medicine

## 2012-12-11 ENCOUNTER — Encounter: Payer: Self-pay | Admitting: *Deleted

## 2012-12-11 ENCOUNTER — Ambulatory Visit (INDEPENDENT_AMBULATORY_CARE_PROVIDER_SITE_OTHER): Payer: 59 | Admitting: Internal Medicine

## 2012-12-11 VITALS — BP 118/78 | HR 77 | Ht 67.0 in | Wt 187.2 lb

## 2012-12-11 DIAGNOSIS — G4733 Obstructive sleep apnea (adult) (pediatric): Secondary | ICD-10-CM

## 2012-12-11 MED ORDER — ALPRAZOLAM 0.5 MG PO TABS: 0.5000 mg | ORAL_TABLET | Freq: Every evening | ORAL | Status: DC | PRN

## 2012-12-11 NOTE — Patient Instructions (Addendum)
Order  DME new CPAP autotitrate 5-20 cwp x 7 days, mask of choice, humidifier, supplies     Dx OSA  Please call as needed  Script for xanax to use for sleep if needed

## 2012-12-11 NOTE — Progress Notes (Signed)
12/10/12- 33 yoM former smoker referred courtesy of Dr Docia Chuck for sleep medicine evaluation.  Wife is here- a Engineer, civil (consulting). Wakes several times at night, mouth dry. Vivid, active dreams. Limb jerks "move bed"; has kicked and fallen out of bed.  Bedtime varies. Variable sleep latency. Wakes 3-4x/ night with no regular getting up time.  Back sleeper, loud snore. Hx sinus surgery, tonsillectomy, allergic rhinitis Was on lexapro, xanax after death of parents.  NPSG 2012/10/02- AHI 16.2/ hr, weight 172 lbs, mild limb jerks.  Prior to Admission medications   Medication Sig Start Date End Date Taking? Authorizing Provider  timolol (BETIMOL) 0.5 % ophthalmic solution Place 1 drop into both eyes daily.   Yes Historical Provider, MD  ALPRAZolam Prudy Feeler) 0.5 MG tablet Take 1 tablet (0.5 mg total) by mouth at bedtime as needed for sleep. 12/11/12   Waymon Budge, MD   Earl Lagos History reviewed. No pertinent past surgical history. History reviewed. No pertinent family history. History   Social History  . Marital Status: Married    Spouse Name: N/A    Number of Children: N/A  . Years of Education: N/A   Occupational History  . Not on file.   Social History Main Topics  . Smoking status: Former Smoker -- 1.00 packs/day for 5 years    Types: Cigarettes    Quit date: 08/09/1970  . Smokeless tobacco: Not on file     Comment: Very Rare cigar use  . Alcohol Use: Yes     Comment: rare  . Drug Use: Not on file  . Sexually Active: Not on file   Other Topics Concern  . Not on file   Social History Narrative  . No narrative on file   ROS-see HPI Constitutional:   No-   weight loss, night sweats, fevers, chills, +fatigue, lassitude. HEENT:   +  headaches, +difficulty swallowing, tooth/dental problems, sore throat,       No-  sneezing, itching, ear ache, +nasal congestion, post nasal drip,  CV:  No-   chest pain, orthopnea, PND, swelling in lower extremities, anasarca,                                  dizziness,  palpitations Resp: No-   shortness of breath with exertion or at rest.              No-   productive cough,  No non-productive cough,  No- coughing up of blood.              No-   change in color of mucus.  No- wheezing.   Skin: No-   rash or lesions. GI:  + heartburn,+ indigestion,  No-abdominal pain, nausea, vomiting, diarrhea,                 change in bowel habits, loss of appetite GU: No-   dysuria, change in color of urine, no urgency or frequency.  No- flank pain. MS:    +joint pain or swelling.  No- decreased range of motion.  No- back pain. Neuro-     nothing unusual Psych:  No- change in mood or affect. No depression, + anxiety.  No memory loss.  OBJ- Physical Exam General- Alert, Oriented, Affect-appropriate, Distress- none acute, medium build Skin- rash-none, lesions- none, excoriation- none Lymphadenopathy- none Head- atraumatic            Eyes- Gross vision intact, PERRLA, conjunctivae and  secretions clear            Ears- Hearing, canals-normal            Nose- Clear, no-Septal dev, mucus, polyps, erosion, perforation             Throat- Mallampati II , mucosa clear , drainage- none, tonsils- atrophic Neck- flexible , trachea midline, no stridor , thyroid nl, carotid no bruit Chest - symmetrical excursion , unlabored           Heart/CV- RRR , no murmur , no gallop  , no rub, nl s1 s2                           - JVD- none , edema- none, stasis changes- none, varices- none           Lung- clear to P&A, wheeze- none, cough- none , dullness-none, rub- none           Chest wall-  Abd- tender-no, distended-no, bowel sounds-present, HSM- no Br/ Gen/ Rectal- Not done, not indicated Extrem- cyanosis- none, clubbing, none, atrophy- none, strength- nl Neuro- grossly intact to observation

## 2012-12-20 DIAGNOSIS — G4733 Obstructive sleep apnea (adult) (pediatric): Secondary | ICD-10-CM | POA: Insufficient documentation

## 2012-12-20 HISTORY — DX: Obstructive sleep apnea (adult) (pediatric): G47.33

## 2012-12-20 NOTE — Assessment & Plan Note (Signed)
Moderate obstructive sleep apnea with mild periodic limb movement syndrome. We discussed the physiology, medical concerns of OSA, and basic sleep hygiene. Emphasized his responsibility to drive safely. Discussed CPAP and compared to alternatives, especially Oral Appliances. He will try CPAP first, but is interested in oral appliance because of travel.

## 2013-01-16 ENCOUNTER — Other Ambulatory Visit: Payer: Self-pay | Admitting: Orthopedic Surgery

## 2013-01-22 ENCOUNTER — Ambulatory Visit (INDEPENDENT_AMBULATORY_CARE_PROVIDER_SITE_OTHER): Payer: Self-pay | Admitting: Surgery

## 2013-01-25 ENCOUNTER — Ambulatory Visit: Payer: 59 | Admitting: Internal Medicine

## 2013-01-29 ENCOUNTER — Ambulatory Visit (INDEPENDENT_AMBULATORY_CARE_PROVIDER_SITE_OTHER): Payer: Self-pay | Admitting: General Surgery

## 2013-02-13 ENCOUNTER — Encounter (HOSPITAL_BASED_OUTPATIENT_CLINIC_OR_DEPARTMENT_OTHER): Payer: Self-pay | Admitting: *Deleted

## 2013-02-13 NOTE — Progress Notes (Signed)
Pt had chest pain-indigestion 2011-had stress test eagle-negative-put on nexium

## 2013-02-14 NOTE — H&P (Addendum)
  Anthony [name] is an 62 y.o. male.   Chief Complaint: c/o STS symptoms left long finger and mucoid cyst DIP joint left long finger HPI: Trey Paula returns with locking of his left long finger and a mucoid cyst in the dorsal radial aspect of his left long finger nail fold causing a nail deformity full length of the nail plate.  Trey Paula has not responded to injection of his left long finger A-1 pulley. He would like to proceed with release of the A-1 pulley and decompression of his nail fold with excision of his mucoid cyst and DIP joint debridement.   Past Medical History  Diagnosis Date  . GERD (gastroesophageal reflux disease)   . Seasonal allergies   . Glaucoma   . Headache(784.0)   . Sleep apnea     using a cpap-moderate  . Depression   . Psoriasis     Past Surgical History  Procedure Laterality Date  . Tonsillectomy    . External ear surgery      ears reset surgically age 9  . 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 Carpal tunnel with cubital tunnel  2009    and ulnar nerve-left  . Shoulder arthroscopy  2006    left  . Retinal detachment surgery  2004    right  . Retinal detachment surgery  2002    left  . Appendectomy      History reviewed. No pertinent family history. Social History:  reports that he quit smoking about 42 years ago. His smoking use included Cigarettes. He has a 5 pack-year smoking history. He does not have any smokeless tobacco history on file. He reports that  drinks alcohol. His drug history is not on file.  Allergies:  Allergies  Allergen Reactions  . Nsaids     Gi upset    No prescriptions prior to admission    No results found for this or any previous visit (from the past 48 hour(s)).  No results found.   Pertinent items are noted in HPI.  There were no vitals taken for this visit.  General appearance: alert Head: Normocephalic, without obvious abnormality Neck: supple, symmetrical, trachea midline Resp: clear to  auscultation bilaterally Cardio: regular rate and rhythm GI: normal findings: bowel sounds normal Extremities:Exam reveals locking of his left long finger and a mucoid cyst in the dorsal radial aspect of his left long finger nail fold causing a nail deformity full length of the nail plate.   Pulses: 2+ and symmetric Skin: normal Neurologic: Grossly normal   Assessment/Plan Impression:STS left long finger with mucoid cyst DIP joint  Plan: To the OR for release A-1 pulley and excision mucoid cyst DIP joint left long finger.The procedure, risks,benefits and post-op course were discussed with the patient at length and they were in agreement with the plan.  DASNOIT,Izick Gasbarro J 02/14/2013, 3:28 PM   H&P documentation: 02/15/2013  -History and Physical Reviewed  -Patient has been re-examined  -No change in the plan of care  Wyn Forster, MD

## 2013-02-15 ENCOUNTER — Encounter (HOSPITAL_BASED_OUTPATIENT_CLINIC_OR_DEPARTMENT_OTHER): Payer: Self-pay | Admitting: Orthopedic Surgery

## 2013-02-15 ENCOUNTER — Encounter (HOSPITAL_BASED_OUTPATIENT_CLINIC_OR_DEPARTMENT_OTHER)
Admission: RE | Disposition: A | Discharge status: Home / Self Care | Payer: Self-pay | Source: Ambulatory Visit | Attending: Orthopedic Surgery

## 2013-02-15 ENCOUNTER — Encounter (HOSPITAL_BASED_OUTPATIENT_CLINIC_OR_DEPARTMENT_OTHER): Payer: Self-pay | Admitting: Anesthesiology

## 2013-02-15 ENCOUNTER — Ambulatory Visit (HOSPITAL_BASED_OUTPATIENT_CLINIC_OR_DEPARTMENT_OTHER)
Admission: RE | Admit: 2013-02-15 | Discharge: 2013-02-15 | Disposition: A | Discharge status: Home / Self Care | Payer: 59 | Source: Ambulatory Visit | Attending: Orthopedic Surgery | Admitting: Orthopedic Surgery

## 2013-02-15 ENCOUNTER — Ambulatory Visit (HOSPITAL_BASED_OUTPATIENT_CLINIC_OR_DEPARTMENT_OTHER): Payer: 59 | Admitting: Anesthesiology

## 2013-02-15 DIAGNOSIS — Z87891 Personal history of nicotine dependence: Secondary | ICD-10-CM | POA: Insufficient documentation

## 2013-02-15 DIAGNOSIS — K219 Gastro-esophageal reflux disease without esophagitis: Secondary | ICD-10-CM | POA: Insufficient documentation

## 2013-02-15 DIAGNOSIS — L408 Other psoriasis: Secondary | ICD-10-CM | POA: Insufficient documentation

## 2013-02-15 DIAGNOSIS — H409 Unspecified glaucoma: Secondary | ICD-10-CM | POA: Insufficient documentation

## 2013-02-15 DIAGNOSIS — M6530 Trigger finger, unspecified finger: Secondary | ICD-10-CM | POA: Insufficient documentation

## 2013-02-15 DIAGNOSIS — G4730 Sleep apnea, unspecified: Secondary | ICD-10-CM | POA: Insufficient documentation

## 2013-02-15 DIAGNOSIS — F3289 Other specified depressive episodes: Secondary | ICD-10-CM | POA: Insufficient documentation

## 2013-02-15 DIAGNOSIS — M898X9 Other specified disorders of bone, unspecified site: Secondary | ICD-10-CM | POA: Insufficient documentation

## 2013-02-15 DIAGNOSIS — F329 Major depressive disorder, single episode, unspecified: Secondary | ICD-10-CM | POA: Insufficient documentation

## 2013-02-15 DIAGNOSIS — J301 Allergic rhinitis due to pollen: Secondary | ICD-10-CM | POA: Insufficient documentation

## 2013-02-15 DIAGNOSIS — D2110 Benign neoplasm of connective and other soft tissue of unspecified upper limb, including shoulder: Secondary | ICD-10-CM | POA: Insufficient documentation

## 2013-02-15 DIAGNOSIS — Z886 Allergy status to analgesic agent status: Secondary | ICD-10-CM | POA: Insufficient documentation

## 2013-02-15 DIAGNOSIS — M19049 Primary osteoarthritis, unspecified hand: Secondary | ICD-10-CM | POA: Insufficient documentation

## 2013-02-15 HISTORY — DX: Unspecified glaucoma: H40.9

## 2013-02-15 HISTORY — DX: Other seasonal allergic rhinitis: J30.2

## 2013-02-15 HISTORY — PX: TRIGGER FINGER RELEASE: SHX641

## 2013-02-15 HISTORY — DX: Depression, unspecified: F32.A

## 2013-02-15 HISTORY — DX: Sleep apnea, unspecified: G47.30

## 2013-02-15 HISTORY — DX: Psoriasis, unspecified: L40.9

## 2013-02-15 HISTORY — DX: Headache: R51

## 2013-02-15 HISTORY — DX: Gastro-esophageal reflux disease without esophagitis: K21.9

## 2013-02-15 HISTORY — DX: Major depressive disorder, single episode, unspecified: F32.9

## 2013-02-15 LAB — POCT HEMOGLOBIN-HEMACUE: Hemoglobin: 13.7 g/dL (ref 13.0–17.0)

## 2013-02-15 SURGERY — RELEASE, A1 PULLEY, FOR TRIGGER FINGER
Anesthesia: Monitor Anesthesia Care | Site: Hand | Laterality: Left | Wound class: Clean

## 2013-02-15 MED ORDER — MIDAZOLAM HCL 5 MG/5ML IJ SOLN
INTRAMUSCULAR | Status: DC | PRN
  Administered 2013-02-15: 1 mg via INTRAVENOUS

## 2013-02-15 MED ORDER — ONDANSETRON HCL 4 MG/2ML IJ SOLN
INTRAMUSCULAR | Status: DC | PRN
  Administered 2013-02-15: 4 mg via INTRAVENOUS

## 2013-02-15 MED ORDER — LIDOCAINE HCL (CARDIAC) 20 MG/ML IV SOLN
INTRAVENOUS | Status: DC | PRN
  Administered 2013-02-15: 50 mg via INTRAVENOUS

## 2013-02-15 MED ORDER — PROPOFOL INFUSION 10 MG/ML OPTIME
INTRAVENOUS | Status: DC | PRN
  Administered 2013-02-15: 75 ug/kg/min via INTRAVENOUS

## 2013-02-15 MED ORDER — LACTATED RINGERS IV SOLN
INTRAVENOUS | Status: DC
  Administered 2013-02-15: 10:00:00 via INTRAVENOUS

## 2013-02-15 MED ORDER — OXYCODONE-ACETAMINOPHEN 5-325 MG PO TABS: ORAL_TABLET | ORAL | Status: DC

## 2013-02-15 MED ORDER — LIDOCAINE HCL 2 % IJ SOLN
INTRAMUSCULAR | Status: DC | PRN
  Administered 2013-02-15: 4.75 mL

## 2013-02-15 MED ORDER — FENTANYL CITRATE 0.05 MG/ML IJ SOLN
INTRAMUSCULAR | Status: DC | PRN
  Administered 2013-02-15: 50 ug via INTRAVENOUS

## 2013-02-15 MED ORDER — CHLORHEXIDINE GLUCONATE 4 % EX LIQD: 60.0000 mL | Freq: Once | CUTANEOUS | Status: DC

## 2013-02-15 SURGICAL SUPPLY — 34 items
BANDAGE ADHESIVE 1X3 (GAUZE/BANDAGES/DRESSINGS) IMPLANT
BANDAGE ELASTIC 3 VELCRO ST LF (GAUZE/BANDAGES/DRESSINGS) ×2 IMPLANT
BLADE SURG 15 STRL LF DISP TIS (BLADE) ×1 IMPLANT
BLADE SURG 15 STRL SS (BLADE) ×2
BNDG CMPR 9X4 STRL LF SNTH (GAUZE/BANDAGES/DRESSINGS)
BNDG ESMARK 4X9 LF (GAUZE/BANDAGES/DRESSINGS) IMPLANT
BRUSH SCRUB EZ PLAIN DRY (MISCELLANEOUS) ×2 IMPLANT
CLOTH BEACON ORANGE TIMEOUT ST (SAFETY) ×2 IMPLANT
CORDS BIPOLAR (ELECTRODE) IMPLANT
COVER MAYO STAND STRL (DRAPES) ×2 IMPLANT
COVER TABLE BACK 60X90 (DRAPES) ×2 IMPLANT
CUFF TOURNIQUET SINGLE 18IN (TOURNIQUET CUFF) IMPLANT
DECANTER SPIKE VIAL GLASS SM (MISCELLANEOUS) IMPLANT
DRAPE EXTREMITY T 121X128X90 (DRAPE) ×2 IMPLANT
DRAPE SURG 17X23 STRL (DRAPES) ×2 IMPLANT
GLOVE BIOGEL M STRL SZ7.5 (GLOVE) ×2 IMPLANT
GLOVE ORTHO TXT STRL SZ7.5 (GLOVE) ×2 IMPLANT
GOWN BRE IMP PREV XXLGXLNG (GOWN DISPOSABLE) ×4 IMPLANT
GOWN PREVENTION PLUS XLARGE (GOWN DISPOSABLE) ×2 IMPLANT
NEEDLE 27GAX1X1/2 (NEEDLE) IMPLANT
PACK BASIN DAY SURGERY FS (CUSTOM PROCEDURE TRAY) ×2 IMPLANT
PADDING CAST ABS 4INX4YD NS (CAST SUPPLIES) ×1
PADDING CAST ABS COTTON 4X4 ST (CAST SUPPLIES) ×1 IMPLANT
SPONGE GAUZE 4X4 12PLY (GAUZE/BANDAGES/DRESSINGS) ×2 IMPLANT
STOCKINETTE 4X48 STRL (DRAPES) ×2 IMPLANT
STRIP CLOSURE SKIN 1/2X4 (GAUZE/BANDAGES/DRESSINGS) IMPLANT
SUT ETHILON 5 0 P 3 18 (SUTURE)
SUT NYLON ETHILON 5-0 P-3 1X18 (SUTURE) IMPLANT
SUT PROLENE 3 0 PS 2 (SUTURE) IMPLANT
SUT PROLENE 4 0 P 3 18 (SUTURE) IMPLANT
SYR 3ML 23GX1 SAFETY (SYRINGE) IMPLANT
SYR CONTROL 10ML LL (SYRINGE) IMPLANT
TRAY DSU PREP LF (CUSTOM PROCEDURE TRAY) ×2 IMPLANT
UNDERPAD 30X30 INCONTINENT (UNDERPADS AND DIAPERS) ×2 IMPLANT

## 2013-02-15 NOTE — Anesthesia Preprocedure Evaluation (Signed)
Anesthesia Evaluation  Patient identified by MRN, date of birth, ID band Patient awake    Reviewed: Allergy & Precautions, H&P , NPO status , Patient's Chart, lab work & pertinent test results  Airway Mallampati: II  Neck ROM: full    Dental   Pulmonary sleep apnea ,          Cardiovascular     Neuro/Psych  Headaches, Depression    GI/Hepatic GERD-  ,  Endo/Other    Renal/GU      Musculoskeletal   Abdominal   Peds  Hematology   Anesthesia Other Findings   Reproductive/Obstetrics                           Anesthesia Physical Anesthesia Plan  ASA: II  Anesthesia Plan: MAC   Post-op Pain Management:    Induction: Intravenous  Airway Management Planned: Simple Face Mask  Additional Equipment:   Intra-op Plan:   Post-operative Plan:   Informed Consent: I have reviewed the patients History and Physical, chart, labs and discussed the procedure including the risks, benefits and alternatives for the proposed anesthesia with the patient or authorized representative who has indicated his/her understanding and acceptance.     Plan Discussed with: Anesthesiologist, Surgeon and CRNA  Anesthesia Plan Comments:         Anesthesia Quick Evaluation

## 2013-02-15 NOTE — Op Note (Signed)
[id number] 

## 2013-02-15 NOTE — Anesthesia Postprocedure Evaluation (Signed)
Anesthesia Post Note  Patient: Anthony [name]  Procedure(s) Performed: Procedure(s) (LRB): RELEASE TRIGGER FINGER/A-1 PULLEY LEFT LONG FINGER (Left) CYST EXCISION DIP DEBRIDEMENT (Left)  Anesthesia type: MAC  Patient location: PACU  Post pain: Pain level controlled and Adequate analgesia  Post assessment: Post-op Vital signs reviewed, Patient's Cardiovascular Status Stable and Respiratory Function Stable  Last Vitals:  Filed Vitals:   02/15/13 1145  BP: 132/79  Pulse: 46  Temp:   Resp: 12    Post vital signs: Reviewed and stable  Level of consciousness: awake, alert  and oriented  Complications: No apparent anesthesia complications

## 2013-02-15 NOTE — Transfer of Care (Signed)
Immediate Anesthesia Transfer of Care Note  Patient: Anthony [name]  Procedure(s) Performed: Procedure(s): RELEASE TRIGGER FINGER/A-1 PULLEY LEFT LONG FINGER (Left) CYST EXCISION DIP DEBRIDEMENT (Left)  Patient Location: PACU  Anesthesia Type:MAC  Level of Consciousness: awake and alert   Airway & Oxygen Therapy: Patient Spontanous Breathing  Post-op Assessment: Report given to PACU RN and Post -op Vital signs reviewed and stable  Post vital signs: Reviewed and stable  Complications: No apparent anesthesia complications

## 2013-02-15 NOTE — Brief Op Note (Signed)
02/15/2013  11:11 AM  PATIENT:  Hassan Buckler Husk  62 y.o. male  PRE-OPERATIVE DIAGNOSIS:  CHRONIC STS LEFT LONG FINGER, MUCOID CYST LEFT LONG; 727.03F2, 727.48, 715.34  POST-OPERATIVE DIAGNOSIS:  CHRONIC STS LEFT LONG FINGER, MUCOID CYST LEFT   PROCEDURE:  Procedure(s): RELEASE TRIGGER FINGER/A-1 PULLEY LEFT LONG FINGER (Left) CYST EXCISION DIP DEBRIDEMENT (Left)  SURGEON:  Surgeon(s) and Role:    * Wyn Forster., MD - Primary  PHYSICIAN ASSISTANT:   ASSISTANTS: Surgical assistant  ANESTHESIA:   MAC  EBL:     BLOOD ADMINISTERED:none  DRAINS: none   LOCAL MEDICATIONS USED:  XYLOCAINE   SPECIMEN:  No Specimen  DISPOSITION OF SPECIMEN:  N/A  COUNTS:  YES  TOURNIQUET:   Total Tourniquet Time Documented: Upper Arm (Left) - 21 minutes Total: Upper Arm (Left) - 21 minutes   DICTATION: .Other Dictation: Dictation Number [id number]  PLAN OF CARE: Discharge to home after PACU  PATIENT DISPOSITION:  PACU - hemodynamically stable.   Delay start of Pharmacological VTE agent (>24hrs) due to surgical blood loss or risk of bleeding: not applicable

## 2013-02-16 ENCOUNTER — Encounter (HOSPITAL_BASED_OUTPATIENT_CLINIC_OR_DEPARTMENT_OTHER): Payer: Self-pay | Admitting: Orthopedic Surgery

## 2013-02-16 NOTE — Op Note (Signed)
NAMEDONTERRIUS, SANTUCCI          ACCOUNT NO.:  [id number]  MEDICAL RECORD NO.:  [id number]  LOCATION:                               FACILITY:  [facility]  PHYSICIAN:  Katy Fitch. Jermisha Hoffart, M.D. 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  Also full length nail plate deformity radial aspect of left long finger with degenerative arthritis of the DIP joint and palpable myxoid cyst on dorsal radial nail fold.  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 Also full length nail plate deformity radial aspect of left long finger with degenerative arthritis of the DIP joint and palpable myxoid cyst on dorsal radial nail fold.  OPERATION: 1. Release of left long finger A1 pulley. 2. Resection of 2 myxoid cysts one over the proximal A1 pulley region,     second over the A2 pulley region, left long finger. 3. Distal interphalangeal joint arthrotomy with debridement and     removal of marginal osteophytes. 4. Resection of subcutaneous myxoid cyst causing pressure on dorsal     radial nail fold, and  germinal nail matrix.  OPERATING SURGEON:  Katy Fitch. Norvell Caswell, MD  ASSISTANT:  Surgical technician.  ANESTHESIA:  A 2% lidocaine metacarpal head level block and flexor sheath block of left long finger supplemented by IV sedation.  SUPERVISING ANESTHESIOLOGIST:  Achille Rich, MD.  INDICATIONS:  Anthony [name] is a well known patient who presented for evaluation of 2 predicaments.  First, he had a painful mass in the palm that on careful inspection revealed 2 distinct myxoid cysts measuring approximately 1 cm in diameter proximally and 8 mm in diameter distally.  Once was over the  proximal aspect of the A1 pulley, the second was over the proximal aspect of the A2 pulley.  He also had early Dupuytren type response of his palmar fascia, and triggering of the long finger.  Inspection of the distal finger revealed a full thickness nail groove 6 mm width with a palpable mass on the radial nail fold.  We advised him to undergo DIP joint x-ray which revealed degenerative arthritis.  No loose bodies were noted.  We recommended DIP joint debridement and cyst excision to decompress the nail fold.  Detailed anesthesia and informed consent was provided by Dr. Chaney Malling in the holding area.  Anthony [name] selected monitored anesthesia care with a digital block, flexor sheath block and IV sedation.  PROCEDURE:  Anthony [name] was interviewed in the holding area.  His proper surgical site was identified per protocol with a marking pen.  He was transferred to room 1 of the [facility] and placed in supine position on the operating table.  Under Dr. Seward Meth direct supervision, IV sedation was provided.  The left hand and arm were then prepped with Betadine soap and solution and sterilely draped.  A 2% lidocaine was infiltrated in metacarpal head level including the flexor sheath to obtain a digital block, a total volume of 5 mL  of 2% lidocaine without epinephrine.  After few moments, excellent anesthesia was achieved.  Following exsanguination of the left arm and hand with an Esmarch bandage, an arterial tourniquet on proximal brachium was inflated to 220 mmHg for the palmar portion of the procedure.  The fingers were placed in a lead hand followed by creation of a Brunner zigzag incision exposing from A1 to A2 of the flexor sheath.  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  The fascia was addressed with segmental resection followed by identification of the flexor sheath and retraction of the neurovascular bundles.  The cysts were  resected with a rongeur dissection including a small portion of the normal appearing retinaculum at their base.  The A1 pulley was split with scalpel and scissors.  There was no A0 pulley noted.  Following release of the A1 pulley, the flexor tendons were delivered and synovectomy accomplished.  Thereafter, free range of motion of the long finger was obtained.  The wound was then repaired with intradermal 3-0 Prolene suture x2 with Steri-Strips.  Attention was then directed to the radial aspect of the dorsal DIP joint and nail fold.  A curvilinear incision was fashioned incorporating the region of the cyst distally.  The cyst was circumferentially dissected and excised.  Its neck was excised followed by a dorsal radial arthrotomy with resection of the capsule between the terminal extensor tendon slip and the radial collateral ligament.  A micro rongeur was used to remove marginal osteophytes.  The joint was then cleaned with a micro rongeur and a curette.  Synovium was removed.  No frank loose bodies were noted.  There was moderate degenerative arthritis of the DIP joint.  The joint was then irrigated with sterile saline followed by repair of the wound with intradermal 4-0 Prolene suture and Steri-Strips.  Dressings were applied with sterile gauze, sterile Kerlix, and Coban. There were no apparent complications.  Anthony [name] was provided prescription for postoperative pain including oxycodone 5 mg 1 p.o. q.4-6 hours p.r.n. pain, 20 tablets without refill.  Also, he was provided doxycycline 100 mg 1 p.o. b.i.d. x4 days as a prophylactic antibiotic due to joint entry and bony debridement.     Katy Fitch [name], M.D.     RVS/MEDQ  D:  02/15/2013  T:  02/15/2013  Job:  [id number]

## 2013-03-08 ENCOUNTER — Encounter: Payer: Self-pay | Admitting: Internal Medicine

## 2013-03-08 ENCOUNTER — Ambulatory Visit (INDEPENDENT_AMBULATORY_CARE_PROVIDER_SITE_OTHER): Payer: 59 | Admitting: Internal Medicine

## 2013-03-08 VITALS — BP 118/60 | HR 66 | Ht 67.0 in | Wt 184.6 lb

## 2013-03-08 DIAGNOSIS — G4733 Obstructive sleep apnea (adult) (pediatric): Secondary | ICD-10-CM

## 2013-03-08 NOTE — Patient Instructions (Addendum)
Order- DME APS change CPAP to fixed 9 cwp    Dx OSA  Order- Referral to Dr Althea Grimmer, orthodontist    Oral appliance for OSA  Please call as needed

## 2013-03-08 NOTE — Progress Notes (Signed)
12/10/12- 41 yoM former smoker referred courtesy of Dr Docia Chuck for sleep medicine evaluation.  Wife is here- a Engineer, civil (consulting). Wakes several times at night, mouth dry. Vivid, active dreams. Limb jerks "move bed"; has kicked and fallen out of bed.  Bedtime varies. Variable sleep latency. Wakes 3-4x/ night with no regular getting up time.  Back sleeper, loud snore. Hx sinus surgery, tonsillectomy, allergic rhinitis Was on lexapro, xanax after death of parents.  NPSG 2012-10-07- AHI 16.2/ hr, weight 172 lbs, mild limb jerks.  03/08/13- 8 yoM former smoker referred courtesy of Dr Docia Chuck for sleep medicine evaluation.  Wife is here- a Engineer, civil (consulting). FOLLOWS FOR: wears CPAP auto/ APS most of the time; some nights he is not around electricity . Camps and fishes away from electricity. CPAP works well some nights, but positional with nasal pillows mask.  Download indicates fixed presure 9.  ROS-see HPI Constitutional:   No-   weight loss, night sweats, fevers, chills, +fatigue, lassitude. HEENT:   +  headaches, +difficulty swallowing, tooth/dental problems, sore throat,       No-  sneezing, itching, ear ache, +nasal congestion, post nasal drip,  CV:  No-   chest pain, orthopnea, PND, swelling in lower extremities, anasarca, dizziness, palpitations Resp: No-   shortness of breath with exertion or at rest.              No-   productive cough,  No non-productive cough,  No- coughing up of blood.              No-   change in color of mucus.  No- wheezing.   Skin: No-   rash or lesions. GI:  + heartburn,+ indigestion,  No-abdominal pain, nausea, vomiting,  GU: . MS:    +joint pain or swelling.  . Neuro-     nothing unusual Psych:  No- change in mood or affect. No depression, + anxiety.  No memory loss.  OBJ- Physical Exam General- Alert, Oriented, Affect-appropriate, Distress- none acute, medium build. Small mandible. Skin- rash-none, lesions- none, excoriation- none Lymphadenopathy- none Head- atraumatic  Eyes- Gross vision intact, PERRLA, conjunctivae and secretions clear            Ears- Hearing, canals-normal            Nose- Clear, no-Septal dev, mucus, polyps, erosion, perforation             Throat- Mallampati II , mucosa clear , drainage- none, tonsils- atrophic. Own teeth Neck- flexible , trachea midline, no stridor , thyroid nl, carotid no bruit Chest - symmetrical excursion , unlabored           Heart/CV- RRR , no murmur , no gallop  , no rub, nl s1 s2                           - JVD- none , edema- none, stasis changes- none, varices- none           Lung- clear to P&A, wheeze- none, cough- none , dullness-none, rub- none           Chest wall-  Abd-  Br/ Gen/ Rectal- Not done, not indicated Extrem- cyanosis- none, clubbing, none, atrophy- none, strength- nl Neuro- grossly intact to observation

## 2013-03-08 NOTE — Assessment & Plan Note (Signed)
Discussed adjustment options for CPAP and will change to fixed 9 for trial. Meanwhile, he may be a very good candidate for an oral appliance. He agrees to referral.

## 2013-08-31 ENCOUNTER — Other Ambulatory Visit: Payer: Self-pay | Admitting: Orthopedic Surgery

## 2013-09-03 ENCOUNTER — Encounter (HOSPITAL_BASED_OUTPATIENT_CLINIC_OR_DEPARTMENT_OTHER): Payer: Self-pay | Admitting: *Deleted

## 2013-09-03 NOTE — Progress Notes (Signed)
Pt here 7/14-did well-no labs needed Does use a cpap and will use post op

## 2013-09-05 NOTE — H&P (Signed)
Anthony [name] is an 63 y.o. male.   Chief Complaint: c/o numbness/tingling left ring and small fingers HPI: Anthony [name] is a well known patient who presents for recurrent numbness in his left hand.  He has numbness in the long, ring and small fingers particularly at night.  He awakens at night with dense numbness in the ulnar innervated digits.  He has not noted any hand weakness or muscle atrophy.  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  He had similar surgery on the right.  He has had excellent long term result.  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  He works very hard with his hands.   We had a detailed discussion regarding his predicament.  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 GERD (gastroesophageal reflux disease)   . Seasonal allergies   . Glaucoma   . Headache(784.0)   . Sleep apnea     using a cpap-moderate  . Depression   . Psoriasis     Past Surgical History  Procedure Laterality Date  . Tonsillectomy    . External ear surgery      ears reset surgically age 71  . 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 Carpal tunnel with cubital tunnel  2009    and ulnar nerve-left  . Shoulder arthroscopy  2006    left  . Retinal detachment surgery  2004    right  . Retinal detachment surgery  2002    left  . Appendectomy    . Trigger finger release Left 02/15/2013    Procedure: RELEASE TRIGGER FINGER/A-1 PULLEY LEFT LONG FINGER;  Surgeon: Cammie Sickle., MD;  Location: [place];  Service: Orthopedics;  Laterality: Left;    History reviewed. No pertinent family history. Social History:  reports that he quit smoking about 43 years ago. His smoking use included Cigarettes. He has a 5 pack-year smoking history. He does not have any smokeless tobacco history on file. He  reports that he drinks alcohol. His drug history is not on file.  Allergies:  Allergies  Allergen Reactions  . Nsaids     Gi upset    No prescriptions prior to admission    No results found for this or any previous visit (from the past 48 hour(s)).  No results found.   Pertinent items are noted in HPI.  Height 5\' 7"  (1.702 m), weight 83.462 kg (184 lb).  General appearance: alert Head: Normocephalic, without obvious abnormality Neck: supple, symmetrical, trachea midline Resp: clear to auscultation bilaterally Cardio: regular rate and rhythm GI: normal findings: bowel sounds normal Extremities:Clinical examination at this time revealed a well healed small incision at the site of his in situ decompression.  His nerve is stable and range of motion is 0 to 135 degrees flexion.  He has positive elbow hyperflexion test at one minute.  He has 5/5 strength of pinch, finger abduction, adduction and negative Froment sign.  He has negative Wartenberg sign.    We asked Dr. Zebedee Iba to repeat electrodiagnostic studies.  He has a slow ulnar conduction across the left elbow with 44 meters per second conduction velocity Pulses: 2+ and symmetric Skin: normal Neurologic: Grossly normal    Assessment/Plan Impression:  Left ulna nerve compression/instability.  Plan: To the OR for left ulna nerve decompression and subcutaneous transposition.The procedure, risks,benefits and post-op course were discussed with the patient at length and they were in agreement with the plan.  DASNOIT,Kealan Buchan J 09/05/2013, 3:10 PM  H&P documentation: 09/06/2013  -History and Physical Reviewed  -Patient has been re-examined  -No change in the plan of care  Cammie Sickle, MD

## 2013-09-06 ENCOUNTER — Encounter (HOSPITAL_BASED_OUTPATIENT_CLINIC_OR_DEPARTMENT_OTHER)
Admission: RE | Disposition: A | Discharge status: Home / Self Care | Payer: Self-pay | Source: Ambulatory Visit | Attending: Orthopedic Surgery

## 2013-09-06 ENCOUNTER — Encounter (HOSPITAL_BASED_OUTPATIENT_CLINIC_OR_DEPARTMENT_OTHER): Payer: 59 | Admitting: Anesthesiology

## 2013-09-06 ENCOUNTER — Ambulatory Visit (HOSPITAL_BASED_OUTPATIENT_CLINIC_OR_DEPARTMENT_OTHER)
Admission: RE | Admit: 2013-09-06 | Discharge: 2013-09-06 | Disposition: A | Discharge status: Home / Self Care | Payer: 59 | Source: Ambulatory Visit | Attending: Orthopedic Surgery | Admitting: Orthopedic Surgery

## 2013-09-06 ENCOUNTER — Encounter (HOSPITAL_BASED_OUTPATIENT_CLINIC_OR_DEPARTMENT_OTHER): Payer: Self-pay | Admitting: Anesthesiology

## 2013-09-06 ENCOUNTER — Ambulatory Visit (HOSPITAL_BASED_OUTPATIENT_CLINIC_OR_DEPARTMENT_OTHER): Payer: 59 | Admitting: Anesthesiology

## 2013-09-06 DIAGNOSIS — Z9109 Other allergy status, other than to drugs and biological substances: Secondary | ICD-10-CM | POA: Insufficient documentation

## 2013-09-06 DIAGNOSIS — M65849 Other synovitis and tenosynovitis, unspecified hand: Principal | ICD-10-CM

## 2013-09-06 DIAGNOSIS — F3289 Other specified depressive episodes: Secondary | ICD-10-CM | POA: Insufficient documentation

## 2013-09-06 DIAGNOSIS — K219 Gastro-esophageal reflux disease without esophagitis: Secondary | ICD-10-CM | POA: Insufficient documentation

## 2013-09-06 DIAGNOSIS — R51 Headache: Secondary | ICD-10-CM | POA: Insufficient documentation

## 2013-09-06 DIAGNOSIS — H409 Unspecified glaucoma: Secondary | ICD-10-CM | POA: Insufficient documentation

## 2013-09-06 DIAGNOSIS — G4730 Sleep apnea, unspecified: Secondary | ICD-10-CM | POA: Insufficient documentation

## 2013-09-06 DIAGNOSIS — L408 Other psoriasis: Secondary | ICD-10-CM | POA: Insufficient documentation

## 2013-09-06 DIAGNOSIS — M65839 Other synovitis and tenosynovitis, unspecified forearm: Secondary | ICD-10-CM | POA: Insufficient documentation

## 2013-09-06 DIAGNOSIS — F329 Major depressive disorder, single episode, unspecified: Secondary | ICD-10-CM | POA: Insufficient documentation

## 2013-09-06 DIAGNOSIS — Z87891 Personal history of nicotine dependence: Secondary | ICD-10-CM | POA: Insufficient documentation

## 2013-09-06 HISTORY — PX: TRIGGER FINGER RELEASE: SHX641

## 2013-09-06 HISTORY — PX: ULNAR NERVE TRANSPOSITION: SHX2595

## 2013-09-06 LAB — POCT HEMOGLOBIN-HEMACUE: HEMOGLOBIN: 14.4 g/dL (ref 13.0–17.0)

## 2013-09-06 SURGERY — ULNAR NERVE DECOMPRESSION/TRANSPOSITION
Anesthesia: General | Site: Finger | Laterality: Left

## 2013-09-06 MED ORDER — LACTATED RINGERS IV SOLN
INTRAVENOUS | Status: DC
  Administered 2013-09-06 (×2): via INTRAVENOUS

## 2013-09-06 MED ORDER — CEPHALEXIN 500 MG PO CAPS: 500.0000 mg | ORAL_CAPSULE | Freq: Three times a day (TID) | ORAL | Status: DC

## 2013-09-06 MED ORDER — FENTANYL CITRATE 0.05 MG/ML IJ SOLN: 50.0000 ug | INTRAMUSCULAR | Status: DC | PRN

## 2013-09-06 MED ORDER — CHLORHEXIDINE GLUCONATE 4 % EX LIQD: 60.0000 mL | Freq: Once | CUTANEOUS | Status: DC

## 2013-09-06 MED ORDER — MIDAZOLAM HCL 2 MG/2ML IJ SOLN: 1.0000 mg | INTRAMUSCULAR | Status: DC | PRN

## 2013-09-06 MED ORDER — DEXAMETHASONE SODIUM PHOSPHATE 4 MG/ML IJ SOLN
INTRAMUSCULAR | Status: DC | PRN
  Administered 2013-09-06: 10 mg via INTRAVENOUS

## 2013-09-06 MED ORDER — MIDAZOLAM HCL 5 MG/5ML IJ SOLN
INTRAMUSCULAR | Status: DC | PRN
Start: 2013-09-06 — End: 2013-09-06
  Administered 2013-09-06: 2 mg via INTRAVENOUS

## 2013-09-06 MED ORDER — HYDROMORPHONE HCL PF 1 MG/ML IJ SOLN
INTRAMUSCULAR | Status: AC
  Filled 2013-09-06: qty 1

## 2013-09-06 MED ORDER — LIDOCAINE HCL (CARDIAC) 20 MG/ML IV SOLN
INTRAVENOUS | Status: DC | PRN
  Administered 2013-09-06: 30 mg via INTRAVENOUS

## 2013-09-06 MED ORDER — GLYCOPYRROLATE 0.2 MG/ML IJ SOLN
INTRAMUSCULAR | Status: DC | PRN
  Administered 2013-09-06: 0.2 mg via INTRAVENOUS

## 2013-09-06 MED ORDER — FENTANYL CITRATE 0.05 MG/ML IJ SOLN
INTRAMUSCULAR | Status: AC
  Filled 2013-09-06: qty 4

## 2013-09-06 MED ORDER — ONDANSETRON HCL 4 MG/2ML IJ SOLN
INTRAMUSCULAR | Status: DC | PRN
  Administered 2013-09-06: 4 mg via INTRAVENOUS

## 2013-09-06 MED ORDER — OXYCODONE HCL 5 MG PO TABS
5.0000 mg | ORAL_TABLET | Freq: Once | ORAL | Status: AC
  Administered 2013-09-06: 5 mg via ORAL

## 2013-09-06 MED ORDER — ONDANSETRON HCL 4 MG/2ML IJ SOLN: 4.0000 mg | Freq: Once | INTRAMUSCULAR | Status: DC | PRN

## 2013-09-06 MED ORDER — FENTANYL CITRATE 0.05 MG/ML IJ SOLN
INTRAMUSCULAR | Status: DC | PRN
  Administered 2013-09-06: 100 ug via INTRAVENOUS

## 2013-09-06 MED ORDER — PROPOFOL 10 MG/ML IV BOLUS
INTRAVENOUS | Status: DC | PRN
  Administered 2013-09-06: 180 mg via INTRAVENOUS

## 2013-09-06 MED ORDER — OXYCODONE HCL 5 MG PO TABS
ORAL_TABLET | ORAL | Status: AC
  Filled 2013-09-06: qty 1

## 2013-09-06 MED ORDER — MIDAZOLAM HCL 2 MG/2ML IJ SOLN
INTRAMUSCULAR | Status: AC
  Filled 2013-09-06: qty 2

## 2013-09-06 MED ORDER — CEFAZOLIN SODIUM-DEXTROSE 2-3 GM-% IV SOLR
INTRAVENOUS | Status: DC | PRN
  Administered 2013-09-06: 2 g via INTRAVENOUS

## 2013-09-06 MED ORDER — CEFAZOLIN SODIUM-DEXTROSE 2-3 GM-% IV SOLR
INTRAVENOUS | Status: AC
  Filled 2013-09-06: qty 50

## 2013-09-06 MED ORDER — LIDOCAINE HCL 2 % IJ SOLN
INTRAMUSCULAR | Status: DC | PRN
  Administered 2013-09-06: 4 mL

## 2013-09-06 MED ORDER — HYDROMORPHONE HCL PF 1 MG/ML IJ SOLN
0.2500 mg | INTRAMUSCULAR | Status: DC | PRN
  Administered 2013-09-06: 0.5 mg via INTRAVENOUS

## 2013-09-06 MED ORDER — OXYCODONE-ACETAMINOPHEN 5-325 MG PO TABS
ORAL_TABLET | ORAL | Status: DC
Start: 2013-09-06 — End: 2014-07-29

## 2013-09-06 SURGICAL SUPPLY — 55 items
BANDAGE ADH SHEER 1  50/CT (GAUZE/BANDAGES/DRESSINGS) IMPLANT
BANDAGE COBAN STERILE 2 (GAUZE/BANDAGES/DRESSINGS) ×1 IMPLANT
BANDAGE ELASTIC 3 VELCRO ST LF (GAUZE/BANDAGES/DRESSINGS) IMPLANT
BANDAGE ELASTIC 4 VELCRO ST LF (GAUZE/BANDAGES/DRESSINGS) ×1 IMPLANT
BLADE MINI RND TIP GREEN BEAV (BLADE) IMPLANT
BLADE SURG 15 STRL LF DISP TIS (BLADE) ×2 IMPLANT
BLADE SURG 15 STRL SS (BLADE) ×3
BNDG CMPR 9X4 STRL LF SNTH (GAUZE/BANDAGES/DRESSINGS) ×2
BNDG COHESIVE 3X5 TAN STRL LF (GAUZE/BANDAGES/DRESSINGS) IMPLANT
BNDG COHESIVE 4X5 TAN STRL (GAUZE/BANDAGES/DRESSINGS) IMPLANT
BNDG ESMARK 4X9 LF (GAUZE/BANDAGES/DRESSINGS) ×1 IMPLANT
BRUSH SCRUB EZ PLAIN DRY (MISCELLANEOUS) ×3 IMPLANT
CORDS BIPOLAR (ELECTRODE) ×3 IMPLANT
COVER MAYO STAND STRL (DRAPES) ×3 IMPLANT
COVER TABLE BACK 60X90 (DRAPES) ×3 IMPLANT
CUFF TOURNIQUET SINGLE 18IN (TOURNIQUET CUFF) ×1 IMPLANT
DECANTER SPIKE VIAL GLASS SM (MISCELLANEOUS) IMPLANT
DRAPE EXTREMITY T 121X128X90 (DRAPE) ×3 IMPLANT
DRAPE SURG 17X23 STRL (DRAPES) ×3 IMPLANT
DRSG TEGADERM 4X4.75 (GAUZE/BANDAGES/DRESSINGS) ×5 IMPLANT
GLOVE BIO SURGEON STRL SZ7 (GLOVE) ×2 IMPLANT
GLOVE BIOGEL M STRL SZ7.5 (GLOVE) ×2 IMPLANT
GLOVE BIOGEL PI IND STRL 7.0 (GLOVE) IMPLANT
GLOVE BIOGEL PI IND STRL 7.5 (GLOVE) IMPLANT
GLOVE BIOGEL PI INDICATOR 7.0 (GLOVE) ×4
GLOVE BIOGEL PI INDICATOR 7.5 (GLOVE) ×1
GLOVE ECLIPSE 6.5 STRL STRAW (GLOVE) ×3 IMPLANT
GLOVE ORTHO TXT STRL SZ7.5 (GLOVE) ×3 IMPLANT
GOWN STRL REUS W/ TWL LRG LVL3 (GOWN DISPOSABLE) ×2 IMPLANT
GOWN STRL REUS W/ TWL XL LVL3 (GOWN DISPOSABLE) ×4 IMPLANT
GOWN STRL REUS W/TWL LRG LVL3 (GOWN DISPOSABLE) ×12
GOWN STRL REUS W/TWL XL LVL3 (GOWN DISPOSABLE) ×3
LOOP VESSEL MAXI BLUE (MISCELLANEOUS) ×1 IMPLANT
NDL SAFETY ECLIPSE 18X1.5 (NEEDLE) IMPLANT
NEEDLE 27GAX1X1/2 (NEEDLE) ×1 IMPLANT
NEEDLE HYPO 18GX1.5 SHARP (NEEDLE) ×3
NS IRRIG 1000ML POUR BTL (IV SOLUTION) ×1 IMPLANT
PACK BASIN DAY SURGERY FS (CUSTOM PROCEDURE TRAY) ×3 IMPLANT
PADDING CAST ABS 4INX4YD NS (CAST SUPPLIES)
PADDING CAST ABS COTTON 4X4 ST (CAST SUPPLIES) ×2 IMPLANT
SLEEVE SCD COMPRESS KNEE MED (MISCELLANEOUS) ×1 IMPLANT
SPONGE GAUZE 4X4 12PLY (GAUZE/BANDAGES/DRESSINGS) ×2 IMPLANT
SPONGE GAUZE 4X4 12PLY STER LF (GAUZE/BANDAGES/DRESSINGS) ×2 IMPLANT
STOCKINETTE 4X48 STRL (DRAPES) ×3 IMPLANT
STRIP CLOSURE SKIN 1/2X4 (GAUZE/BANDAGES/DRESSINGS) ×4 IMPLANT
SUT PROLENE 3 0 PS 2 (SUTURE) ×3 IMPLANT
SUT VIC AB 3-0 SH 27 (SUTURE) ×6
SUT VIC AB 3-0 SH 27XBRD (SUTURE) IMPLANT
SUT VIC AB 4-0 P-3 18XBRD (SUTURE) IMPLANT
SUT VIC AB 4-0 P3 18 (SUTURE) ×3
SYR 3ML 23GX1 SAFETY (SYRINGE) IMPLANT
SYR BULB 3OZ (MISCELLANEOUS) IMPLANT
SYR CONTROL 10ML LL (SYRINGE) ×1 IMPLANT
TOWEL OR 17X24 6PK STRL BLUE (TOWEL DISPOSABLE) ×6 IMPLANT
UNDERPAD 30X30 INCONTINENT (UNDERPADS AND DIAPERS) ×3 IMPLANT

## 2013-09-06 NOTE — Anesthesia Postprocedure Evaluation (Signed)
Anesthesia Post Note  Patient: Anthony [name]  Procedure(s) Performed: Procedure(s) (LRB): LEFT ANTERIOR SUBCUTANEOUS TRANSPOSITION (Left) RELEASE TRIGGER FINGER/A-1 PULLEY (Left)  Anesthesia type: general  Patient location: PACU  Post pain: Pain level controlled  Post assessment: Patient's Cardiovascular Status Stable  Last Vitals:  Filed Vitals:   09/06/13 1228  BP: 151/79  Pulse: 77  Temp: 36.4 C  Resp: 20    Post vital signs: Reviewed and stable  Level of consciousness: sedated  Complications: No apparent anesthesia complications

## 2013-09-06 NOTE — Anesthesia Procedure Notes (Signed)
Procedure Name: LMA Insertion Date/Time: 09/06/2013 9:46 AM Performed by: Lyndee Leo Pre-anesthesia Checklist: Patient identified, Emergency Drugs available, Suction available and Patient being monitored Patient Re-evaluated:Patient Re-evaluated prior to inductionOxygen Delivery Method: Circle System Utilized Preoxygenation: Pre-oxygenation with 100% oxygen Intubation Type: IV induction Ventilation: Mask ventilation without difficulty LMA: LMA inserted LMA Size: 4.0 Number of attempts: 1 Airway Equipment and Method: bite block Placement Confirmation: positive ETCO2 Tube secured with: Tape Dental Injury: Teeth and Oropharynx as per pre-operative assessment

## 2013-09-06 NOTE — Discharge Instructions (Signed)

## 2013-09-06 NOTE — Transfer of Care (Signed)
Immediate Anesthesia Transfer of Care Note  Patient: Anthony [name]  Procedure(s) Performed: Procedure(s): LEFT ANTERIOR SUBCUTANEOUS TRANSPOSITION (Left) RELEASE TRIGGER FINGER/A-1 PULLEY (Left)  Patient Location: PACU  Anesthesia Type:General  Level of Consciousness: awake, sedated and patient cooperative  Airway & Oxygen Therapy: Patient Spontanous Breathing and Patient connected to face mask oxygen  Post-op Assessment: Report given to PACU RN and Post -op Vital signs reviewed and stable  Post vital signs: Reviewed and stable  Complications: No apparent anesthesia complications

## 2013-09-06 NOTE — Op Note (Signed)
[id number] 

## 2013-09-06 NOTE — Brief Op Note (Signed)
09/06/2013  11:11 AM  PATIENT:  Anthony [name]  63 y.o. male  PRE-OPERATIVE DIAGNOSIS:  LEFT CUBITAL TUNNEL STENOSING TENOSYNOVITIS LEFT RING FINGER  POST-OPERATIVE DIAGNOSIS:  LEFT CUBITAL TUNNEL STENOSING TENOSYNOVITIS LEFT RING FINGER  PROCEDURE:  Procedure(s): LEFT ANTERIOR SUBCUTANEOUS TRANSPOSITION (Left) RELEASE TRIGGER FINGER/A-1 PULLEY (Left)  SURGEON:  Surgeon(s) and Role:    * Cammie Sickle., MD - Primary  PHYSICIAN ASSISTANT:   ASSISTANTS: surgical tech   ANESTHESIA:   general  EBL:  Total I/O In: 1000 [I.V.:1000] Out: -   BLOOD ADMINISTERED:none  DRAINS: none   LOCAL MEDICATIONS USED:  XYLOCAINE   SPECIMEN:  No Specimen  DISPOSITION OF SPECIMEN:  N/A  COUNTS:  YES  TOURNIQUET:   Total Tourniquet Time Documented: Upper Arm (Left) - 54 minutes Total: Upper Arm (Left) - 54 minutes   DICTATION: .Other Dictation: Dictation Number [id number]  PLAN OF CARE: Discharge to home after PACU  PATIENT DISPOSITION:  PACU - hemodynamically stable.   Delay start of Pharmacological VTE agent (>24hrs) due to surgical blood loss or risk of bleeding: not applicable

## 2013-09-06 NOTE — Anesthesia Preprocedure Evaluation (Signed)
Anesthesia Evaluation  Patient identified by MRN, date of birth, ID band Patient awake    Reviewed: Allergy & Precautions, H&P , NPO status , Patient's Chart, lab work & pertinent test results  Airway Mallampati: II TM Distance: >3 FB Neck ROM: Full    Dental  (+) Teeth Intact and Dental Advisory Given   Pulmonary former smoker,  breath sounds clear to auscultation        Cardiovascular Rhythm:Regular Rate:Normal     Neuro/Psych    GI/Hepatic   Endo/Other    Renal/GU      Musculoskeletal   Abdominal   Peds  Hematology   Anesthesia Other Findings   Reproductive/Obstetrics                           Anesthesia Physical Anesthesia Plan  ASA: II  Anesthesia Plan: General   Post-op Pain Management:    Induction: Intravenous  Airway Management Planned: LMA  Additional Equipment:   Intra-op Plan:   Post-operative Plan: Extubation in OR  Informed Consent: I have reviewed the patients History and Physical, chart, labs and discussed the procedure including the risks, benefits and alternatives for the proposed anesthesia with the patient or authorized representative who has indicated his/her understanding and acceptance.   Dental advisory given  Plan Discussed with: CRNA and Anesthesiologist  Anesthesia Plan Comments:         Anesthesia Quick Evaluation

## 2013-09-07 ENCOUNTER — Encounter (HOSPITAL_BASED_OUTPATIENT_CLINIC_OR_DEPARTMENT_OTHER): Payer: Self-pay | Admitting: Orthopedic Surgery

## 2013-09-07 NOTE — Op Note (Signed)
Anthony [name], Anthony [name]          ACCOUNT NO.:  [id number]  MEDICAL RECORD NO.:  [id number]  LOCATION:                                 FACILITY:  PHYSICIAN:  Anthony Mighty. Raymond Azure, M.D.      DATE OF BIRTH:  DATE OF PROCEDURE:  09/06/2013 DATE OF DISCHARGE:                              OPERATIVE REPORT   PREOPERATIVE DIAGNOSES: 1. 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 2. 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  POSTOPERATIVE DIAGNOSES: 1. 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 2. 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  OPERATION: 1. Anterior subcutaneous transposition of left ulnar nerve after     meticulous neurolysis. 2. Release of left ring finger A1 pulley.  OPERATING SURGEON:  Anthony Mighty. Giorgio Chabot, MD  ASSISTANT:  Surgical technician.  ANESTHESIA:  General by LMA.  SUPERVISING ANESTHESIOLOGIST:  Glynda Jaeger, MD  INDICATIONS:  Anthony [name] is a 63 year old employee of [employer]. He has been a longstanding patient of our practice for a decade.  He has had a prior left ulnar in situ decompression that was of excellent service to him for about 5 years.  In the past 6 months, he has had recurrent development of numbness and dysesthesia.  Electrodiagnostic studies revealed marked slowing of the ulnar nerve across cubital tunnel suggesting instability and nerve edema versus scarring.  We advised Anthony [name] to proceed with exploration in anterior subcutaneous transposition.  During our preoperative evaluation, he noted a locking left ring trigger finger.  He was advised that we could proceed with release of the A1 pulley at this time under the same anesthetic.  After informed consent, he was brought to the operating room.  Preoperatively, he was interviewed by Dr. Linna Caprice, who  recommended general anesthesia by LMA technique.  Our plan is to use local anesthesia at the elbow wound postoperatively.  The left ring finger and the left elbow were marked as the proper surgical sites per protocol with a marking pen in the holding area.  PROCEDURE:  Anthony [name] was brought to room 2 of the Anthony [name] and placed in supine position on the operating table.  Following the induction of general anesthesia by LMA technique under Dr. Verneda Skill direct supervision, a 2 g of Ancef were administered as an IV prophylactic antibiotic.  The entire left arm was prepped with Betadine soap and solution and sterilely draped.  A pneumatic tourniquet was applied to the proximal left brachium.  Following exsanguination of the left arm with an Esmarch bandage, arterial tourniquet was inflated to 220 mmHg.  Following routine surgical time-out, procedure commenced with a short oblique incision directly over the palpably thickened A1 pulley of the left ring finger. Subcutaneous tissues were carefully divided.  The pretendinous fibers of the palmar fascia were released followed by identification of the A1 pulley.  This was quite thickened.  There was rather swollen tenosynovium proximal to the A1 pulley.  The pulley was split with scissors, followed by elevation of the tendons.  The redundant  tenosynovium was resected with scissors.  Thereafter, free range of motion of the ring finger was recovered.  The wound was repaired with intradermal 3-0 Prolene suture.  Attention was then directed to the left elbow.  The prior surgical scar was excised and the wound extended to a 7-cm curvilinear incision.  The ulnar nerve was identified and virgin tissue proximal to the cubital tunnel and meticulously neurolysed through the cubital tunnel.  There was a thick scar that had formed surrounding the nerve and at the heads of the flexor carpi ulnaris.  There was clearly compression due  to thickening of this cuff of scar.  After meticulous neurolysis, the fascia at the heads of flexor carpi ulnaris was resected on its radial aspect and a very careful elevation of the subcutaneous tissues allowed creation of subcutaneous tunnel. The medial brachial intermuscular septum was resected taking great care to preserve the metaphyseal vessels.  With the aid of silicone vessel loops, the nerve was transposed to a subcutaneous position with the elbow in 90 degrees of flexion.  A subcutaneous fat and fascia wall was created with multiple interrupted sutures of 3-0 Vicryl, keeping the nerve in its transposed position.  Great care was taken to identify and protect the posterior branch of the medial antebrachial cutaneous nerve throughout the dissection.  The wound was then carefully inspected to be sure there were no sites of kinking of the nerve or vascular lesions.  A very satisfactory transposition was achieved, taking care to preserve the vessels of the mesoneurium.  The tourniquet was released and meticulous hemostasis was achieved both in subcutaneous tissues and the cubital tunnel region with particular attention to the heads of flexor carpi ulnaris.  After hemostasis was achieved, the wound was repaired with layers of 3-0 Vicryl in the subcutaneous tissues, and segmental intradermal 3-0 Prolene.  Steri- Strips were applied followed by sterile gauze, a Tegaderm dressing, ABD pad, Kerlix and an Ace wrap.  There were no apparent complications.  Anthony [name] hand was dressed with gauze and Ace wrap.  There were no apparent complications.  For aftercare, he was provided prescription for Percocet 5 mg 1-2 tablets p.o. q.4-6 hours p.r.n. pain, 30 tablets without refill.  He was also provided Keflex 500 mg 1 p.o. q.8 hours x4 days as prophylactic antibiotic.     Anthony [name], M.D.     RVS/MEDQ  D:  09/06/2013  T:  09/07/2013  Job:  [id number]

## 2014-03-19 ENCOUNTER — Ambulatory Visit: Payer: 59 | Admitting: Internal Medicine

## 2014-03-28 ENCOUNTER — Encounter: Payer: Self-pay | Admitting: Internal Medicine

## 2014-03-28 ENCOUNTER — Encounter (INDEPENDENT_AMBULATORY_CARE_PROVIDER_SITE_OTHER): Payer: Self-pay

## 2014-03-28 ENCOUNTER — Ambulatory Visit (INDEPENDENT_AMBULATORY_CARE_PROVIDER_SITE_OTHER): Payer: 59 | Admitting: Internal Medicine

## 2014-03-28 VITALS — BP 110/58 | HR 48 | Ht 67.0 in | Wt 180.2 lb

## 2014-03-28 DIAGNOSIS — G4733 Obstructive sleep apnea (adult) (pediatric): Secondary | ICD-10-CM

## 2014-03-28 NOTE — Progress Notes (Signed)
12/10/12- 53 yoM former smoker referred courtesy of Dr Dorthy Cooler for sleep medicine evaluation.  Wife is here- a Marine scientist. Wakes several times at night, mouth dry. Vivid, active dreams. Limb jerks "move bed"; has kicked and fallen out of bed.  Bedtime varies. Variable sleep latency. Wakes 3-4x/ night with no regular getting up time.  Back sleeper, loud snore. Hx sinus surgery, tonsillectomy, allergic rhinitis Was on lexapro, xanax after death of parents.  NPSG 10/12/2012- AHI 16.2/ hr, weight 172 lbs, mild limb jerks.  03/08/13- 47 yoM former smoker referred courtesy of Dr Dorthy Cooler for sleep medicine evaluation.  Wife is here- a Marine scientist. FOLLOWS FOR: wears CPAP auto/ APS most of the time; some nights he is not around electricity . Camps and fishes away from electricity. CPAP works well some nights, but positional with nasal pillows mask.  Download indicates fixed presure 9.  03/28/14- 53 yoM former smoker referred courtesy of Dr Dorthy Cooler for sleep medicine evaluation.  Wife is here- a Marine scientist. FOLLOWS FOR: Wears CPAP 9 most every night for about 6-10 hours; DME is [facility].   ROS-see HPI Constitutional:   No-   weight loss, night sweats, fevers, chills, +fatigue, lassitude. HEENT:   +  headaches, +difficulty swallowing, tooth/dental problems, sore throat,       No-  sneezing, itching, ear ache, +nasal congestion, post nasal drip,  CV:  No-   chest pain, orthopnea, PND, swelling in lower extremities, anasarca, dizziness, palpitations Resp: No-   shortness of breath with exertion or at rest.              No-   productive cough,  No non-productive cough,  No- coughing up of blood.              No-   change in color of mucus.  No- wheezing.   Skin: No-   rash or lesions. GI:  + heartburn,+ indigestion,  No-abdominal pain, nausea, vomiting,  GU: . MS:    +joint pain or swelling.  . Neuro-     nothing unusual Psych:  No- change in mood or affect. No depression, + anxiety.  No memory loss.  OBJ- Physical  Exam General- Alert, Oriented, Affect-appropriate, Distress- none acute, medium build. Small mandible. Skin- rash-none, lesions- none, excoriation- none Lymphadenopathy- none Head- atraumatic            Eyes- Gross vision intact, PERRLA, conjunctivae and secretions clear            Ears- Hearing, canals-normal            Nose- Clear, no-Septal dev, mucus, polyps, erosion, perforation             Throat- Mallampati II , mucosa clear , drainage- none, tonsils- atrophic. Own teeth Neck- flexible , trachea midline, no stridor , thyroid nl, carotid no bruit Chest - symmetrical excursion , unlabored           Heart/CV- RRR , no murmur , no gallop  , no rub, nl s1 s2                           - JVD- none , edema- none, stasis changes- none, varices- none           Lung- clear to P&A, wheeze- none, cough- none , dullness-none, rub- none           Chest wall-  Abd-  Br/ Gen/ Rectal- Not done, not indicated Extrem- cyanosis- none,  clubbing, none, atrophy- none, strength- nl Neuro- grossly intact to observation

## 2014-03-28 NOTE — Patient Instructions (Signed)
We can continue CPAP 9/ APS  Please call if we can help

## 2014-07-22 ENCOUNTER — Other Ambulatory Visit: Payer: Self-pay | Admitting: Orthopedic Surgery

## 2014-07-29 ENCOUNTER — Encounter (HOSPITAL_BASED_OUTPATIENT_CLINIC_OR_DEPARTMENT_OTHER): Payer: Self-pay | Admitting: *Deleted

## 2014-08-05 ENCOUNTER — Ambulatory Visit (HOSPITAL_BASED_OUTPATIENT_CLINIC_OR_DEPARTMENT_OTHER)
Admission: RE | Admit: 2014-08-05 | Discharge: 2014-08-05 | Disposition: A | Discharge status: Home / Self Care | Payer: 59 | Source: Ambulatory Visit | Attending: Orthopedic Surgery | Admitting: Orthopedic Surgery

## 2014-08-05 ENCOUNTER — Encounter (HOSPITAL_BASED_OUTPATIENT_CLINIC_OR_DEPARTMENT_OTHER)
Admission: RE | Disposition: A | Discharge status: Home / Self Care | Payer: Self-pay | Source: Ambulatory Visit | Attending: Orthopedic Surgery

## 2014-08-05 ENCOUNTER — Encounter (HOSPITAL_BASED_OUTPATIENT_CLINIC_OR_DEPARTMENT_OTHER): Payer: Self-pay | Admitting: Certified Registered"

## 2014-08-05 ENCOUNTER — Ambulatory Visit (HOSPITAL_BASED_OUTPATIENT_CLINIC_OR_DEPARTMENT_OTHER): Payer: 59 | Admitting: Certified Registered"

## 2014-08-05 DIAGNOSIS — Z87891 Personal history of nicotine dependence: Secondary | ICD-10-CM | POA: Diagnosis not present

## 2014-08-05 DIAGNOSIS — M65351 Trigger finger, right little finger: Secondary | ICD-10-CM | POA: Diagnosis present

## 2014-08-05 DIAGNOSIS — F329 Major depressive disorder, single episode, unspecified: Secondary | ICD-10-CM | POA: Diagnosis not present

## 2014-08-05 DIAGNOSIS — K219 Gastro-esophageal reflux disease without esophagitis: Secondary | ICD-10-CM | POA: Insufficient documentation

## 2014-08-05 HISTORY — PX: TRIGGER FINGER RELEASE: SHX641

## 2014-08-05 SURGERY — RELEASE, A1 PULLEY, FOR TRIGGER FINGER
Anesthesia: Regional | Site: Hand | Laterality: Right

## 2014-08-05 MED ORDER — FENTANYL CITRATE 0.05 MG/ML IJ SOLN
INTRAMUSCULAR | Status: DC | PRN
  Administered 2014-08-05: 50 ug via INTRAVENOUS

## 2014-08-05 MED ORDER — OXYCODONE HCL 5 MG/5ML PO SOLN: 5.0000 mg | Freq: Once | ORAL | Status: DC | PRN

## 2014-08-05 MED ORDER — CEFAZOLIN SODIUM-DEXTROSE 2-3 GM-% IV SOLR
INTRAVENOUS | Status: AC
  Filled 2014-08-05: qty 50

## 2014-08-05 MED ORDER — OXYCODONE HCL 5 MG PO TABS: 5.0000 mg | ORAL_TABLET | Freq: Once | ORAL | Status: DC | PRN

## 2014-08-05 MED ORDER — CEFAZOLIN SODIUM-DEXTROSE 2-3 GM-% IV SOLR: 2.0000 g | INTRAVENOUS | Status: DC

## 2014-08-05 MED ORDER — MIDAZOLAM HCL 2 MG/2ML IJ SOLN: 1.0000 mg | INTRAMUSCULAR | Status: DC | PRN

## 2014-08-05 MED ORDER — PROPOFOL INFUSION 10 MG/ML OPTIME
INTRAVENOUS | Status: DC | PRN
  Administered 2014-08-05: 75 ug/kg/min via INTRAVENOUS

## 2014-08-05 MED ORDER — FENTANYL CITRATE 0.05 MG/ML IJ SOLN: 50.0000 ug | INTRAMUSCULAR | Status: DC | PRN

## 2014-08-05 MED ORDER — 0.9 % SODIUM CHLORIDE (POUR BTL) OPTIME
TOPICAL | Status: DC | PRN
  Administered 2014-08-05: 200 mL

## 2014-08-05 MED ORDER — MIDAZOLAM HCL 2 MG/2ML IJ SOLN
INTRAMUSCULAR | Status: AC
  Filled 2014-08-05: qty 2

## 2014-08-05 MED ORDER — LACTATED RINGERS IV SOLN
INTRAVENOUS | Status: DC
  Administered 2014-08-05: 15:00:00 via INTRAVENOUS

## 2014-08-05 MED ORDER — BUPIVACAINE HCL (PF) 0.25 % IJ SOLN
INTRAMUSCULAR | Status: AC
  Filled 2014-08-05: qty 30

## 2014-08-05 MED ORDER — LIDOCAINE HCL (CARDIAC) 20 MG/ML IV SOLN
INTRAVENOUS | Status: DC | PRN
  Administered 2014-08-05: 30 mg via INTRAVENOUS

## 2014-08-05 MED ORDER — BUPIVACAINE HCL (PF) 0.25 % IJ SOLN
INTRAMUSCULAR | Status: DC | PRN
  Administered 2014-08-05: 5 mL

## 2014-08-05 MED ORDER — HYDROCODONE-ACETAMINOPHEN 5-325 MG PO TABS: ORAL_TABLET | ORAL | Status: DC

## 2014-08-05 MED ORDER — FENTANYL CITRATE 0.05 MG/ML IJ SOLN
INTRAMUSCULAR | Status: AC
  Filled 2014-08-05: qty 4

## 2014-08-05 MED ORDER — LIDOCAINE HCL (PF) 0.5 % IJ SOLN
INTRAMUSCULAR | Status: DC | PRN
  Administered 2014-08-05: 30 mL via INTRAVENOUS

## 2014-08-05 MED ORDER — CHLORHEXIDINE GLUCONATE 4 % EX LIQD: 60.0000 mL | Freq: Once | CUTANEOUS | Status: DC

## 2014-08-05 MED ORDER — FENTANYL CITRATE 0.05 MG/ML IJ SOLN: 25.0000 ug | INTRAMUSCULAR | Status: DC | PRN

## 2014-08-05 SURGICAL SUPPLY — 37 items
BANDAGE COBAN STERILE 2 (GAUZE/BANDAGES/DRESSINGS) ×2 IMPLANT
BLADE MINI RND TIP GREEN BEAV (BLADE) IMPLANT
BLADE SURG 15 STRL LF DISP TIS (BLADE) ×2 IMPLANT
BLADE SURG 15 STRL SS (BLADE) ×4
BNDG CMPR 9X4 STRL LF SNTH (GAUZE/BANDAGES/DRESSINGS) ×1
BNDG CONFORM 2 STRL LF (GAUZE/BANDAGES/DRESSINGS) ×2 IMPLANT
BNDG ESMARK 4X9 LF (GAUZE/BANDAGES/DRESSINGS) ×1 IMPLANT
CHLORAPREP W/TINT 26ML (MISCELLANEOUS) ×2 IMPLANT
CORDS BIPOLAR (ELECTRODE) ×2 IMPLANT
COVER BACK TABLE 60X90IN (DRAPES) ×2 IMPLANT
COVER MAYO STAND STRL (DRAPES) ×2 IMPLANT
CUFF TOURNIQUET SINGLE 18IN (TOURNIQUET CUFF) ×2 IMPLANT
DRAPE EXTREMITY T 121X128X90 (DRAPE) ×2 IMPLANT
DRAPE SURG 17X23 STRL (DRAPES) ×2 IMPLANT
GAUZE SPONGE 4X4 12PLY STRL (GAUZE/BANDAGES/DRESSINGS) ×2 IMPLANT
GAUZE XEROFORM 1X8 LF (GAUZE/BANDAGES/DRESSINGS) ×2 IMPLANT
GLOVE BIO SURGEON STRL SZ7.5 (GLOVE) ×2 IMPLANT
GLOVE BIOGEL M STRL SZ7.5 (GLOVE) ×1 IMPLANT
GLOVE BIOGEL PI IND STRL 8 (GLOVE) ×1 IMPLANT
GLOVE BIOGEL PI INDICATOR 8 (GLOVE) ×2
GLOVE EXAM NITRILE MD LF STRL (GLOVE) ×1 IMPLANT
GOWN STRL REUS W/ TWL LRG LVL3 (GOWN DISPOSABLE) ×1 IMPLANT
GOWN STRL REUS W/ TWL XL LVL3 (GOWN DISPOSABLE) IMPLANT
GOWN STRL REUS W/TWL LRG LVL3 (GOWN DISPOSABLE)
GOWN STRL REUS W/TWL XL LVL3 (GOWN DISPOSABLE) ×4 IMPLANT
NDL HYPO 25X1 1.5 SAFETY (NEEDLE) IMPLANT
NEEDLE HYPO 25X1 1.5 SAFETY (NEEDLE) ×2 IMPLANT
NS IRRIG 1000ML POUR BTL (IV SOLUTION) ×2 IMPLANT
PACK BASIN DAY SURGERY FS (CUSTOM PROCEDURE TRAY) ×2 IMPLANT
PADDING CAST ABS 4INX4YD NS (CAST SUPPLIES)
PADDING CAST ABS COTTON 4X4 ST (CAST SUPPLIES) ×1 IMPLANT
STOCKINETTE 4X48 STRL (DRAPES) ×2 IMPLANT
SUT ETHILON 4 0 PS 2 18 (SUTURE) ×2 IMPLANT
SYR BULB 3OZ (MISCELLANEOUS) ×2 IMPLANT
SYR CONTROL 10ML LL (SYRINGE) ×1 IMPLANT
TOWEL OR 17X24 6PK STRL BLUE (TOWEL DISPOSABLE) ×3 IMPLANT
UNDERPAD 30X30 INCONTINENT (UNDERPADS AND DIAPERS) ×2 IMPLANT

## 2014-08-05 NOTE — Anesthesia Preprocedure Evaluation (Addendum)
Anesthesia Evaluation    Airway Mallampati: II  TM Distance: >3 FB Neck ROM: Full    Dental  (+) Teeth Intact   Pulmonary sleep apnea , former smoker,  breath sounds clear to auscultation        Cardiovascular negative cardio ROS  Rhythm:Regular     Neuro/Psych  Headaches, PSYCHIATRIC DISORDERS Depression    GI/Hepatic Neg liver ROS, GERD-  Medicated,  Endo/Other  negative endocrine ROS  Renal/GU negative Renal ROS     Musculoskeletal   Abdominal   Peds  Hematology negative hematology ROS (+)   Anesthesia Other Findings   Reproductive/Obstetrics                            Anesthesia Physical Anesthesia Plan  ASA: II  Anesthesia Plan: Regional and Bier Block   Post-op Pain Management:    Induction:   Airway Management Planned: Simple Face Mask and Natural Airway  Additional Equipment: None  Intra-op Plan:   Post-operative Plan:   Informed Consent: I have reviewed the patients History and Physical, chart, labs and discussed the procedure including the risks, benefits and alternatives for the proposed anesthesia with the patient or authorized representative who has indicated his/her understanding and acceptance.   Dental advisory given  Plan Discussed with: CRNA and Surgeon  Anesthesia Plan Comments:         Anesthesia Quick Evaluation

## 2014-08-05 NOTE — H&P (Signed)
  Anthony [name] is an 63 y.o. male.   Chief Complaint: right small finger trigger digit HPI: 63 yo male with long term triggering right small finger.  This has been injected twice without lasting relief.  He wishes to have a right small finger trigger release.  Past Medical History  Diagnosis Date  . GERD (gastroesophageal reflux disease)   . Seasonal allergies   . Glaucoma   . Headache(784.0)   . Sleep apnea     using a cpap-moderate  . Depression   . Psoriasis     Past Surgical History  Procedure Laterality Date  . Tonsillectomy    . External ear surgery      ears reset surgically age 76  . 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 Carpal tunnel with cubital tunnel  2009    and ulnar nerve-left  . Shoulder arthroscopy  2006    left  . Retinal detachment surgery  2004    right  . Retinal detachment surgery  2002    left  . Appendectomy    . Trigger finger release Left 02/15/2013    Procedure: RELEASE TRIGGER FINGER/A-1 PULLEY LEFT LONG FINGER;  Surgeon: Cammie Sickle., MD;  Location: [facility];  Service: Orthopedics;  Laterality: Left;  . Ulnar nerve transposition Left 09/06/2013    Procedure: LEFT ANTERIOR SUBCUTANEOUS TRANSPOSITION;  Surgeon: Cammie Sickle., MD;  Location: [facility];  Service: Orthopedics;  Laterality: Left;  . Trigger finger release Left 09/06/2013    Procedure: RELEASE TRIGGER FINGER/A-1 PULLEY;  Surgeon: Cammie Sickle., MD;  Location: [facility];  Service: Orthopedics;  Laterality: Left;    History reviewed. No pertinent family history. Social History:  reports that he quit smoking about 44 years ago. His smoking use included Cigarettes. He has a 5 pack-year smoking history. He does not have any smokeless tobacco history on file. He reports that he drinks alcohol. His drug history is not on file.  Allergies:  Allergies  Allergen Reactions  . Nsaids     Gi upset    No  prescriptions prior to admission    No results found for this or any previous visit (from the past 48 hour(s)).  No results found.   A comprehensive review of systems was negative except for: Eyes: positive for cataracts Ears, nose, mouth, throat, and face: positive for hearing loss and tinnitus Neurological: positive for headaches Behavioral/Psych: positive for depression and sleep disturbance  Height 5\' 7"  (1.702 m), weight 81.647 kg (180 lb).  General appearance: alert, cooperative and appears stated age Head: Normocephalic, without obvious abnormality, atraumatic Neck: supple, symmetrical, trachea midline Resp: clear to auscultation bilaterally Cardio: regular rate and rhythm GI: non tender Extremities: intact sensation and capillary refill all digits.  +epl/fpl/io.  no wounds. Pulses: 2+ and symmetric Skin: Skin color, texture, turgor normal. No rashes or lesions Neurologic: Grossly normal Incision/Wound: none  Assessment/Plan Right small finger trigger digit.  Non operative and operative treatment options were discussed with the patient and patient wishes to proceed with operative treatment. Risks, benefits, and alternatives of surgery were discussed and the patient agrees with the plan of care.   Gleen Ripberger R 08/05/2014, 1:30 PM

## 2014-08-05 NOTE — Discharge Instructions (Addendum)

## 2014-08-05 NOTE — Transfer of Care (Signed)
Immediate Anesthesia Transfer of Care Note  Patient: Anthony [name]  Procedure(s) Performed: Procedure(s): RIGHT SMALL TRIGGER RELEASE  (Right)  Patient Location: PACU  Anesthesia Type:Bier block  Level of Consciousness: awake, alert , oriented and patient cooperative  Airway & Oxygen Therapy: Patient Spontanous Breathing  Post-op Assessment: Report given to PACU RN and Post -op Vital signs reviewed and stable  Post vital signs: Reviewed and stable  Complications: No apparent anesthesia complications

## 2014-08-05 NOTE — Op Note (Signed)
[id number] 

## 2014-08-05 NOTE — Anesthesia Procedure Notes (Addendum)
Procedure Name: MAC Date/Time: 08/05/2014 4:12 PM Performed by: Karma Hiney Pre-anesthesia Checklist: Emergency Drugs available, Patient identified, Suction available, Patient being monitored and Timeout performed Patient Re-evaluated:Patient Re-evaluated prior to inductionOxygen Delivery Method: Simple face mask   Anesthesia Regional Block:  Bier block (IV Regional)  Pre-Anesthetic Checklist: ,, timeout performed, Correct Patient, Correct Site, Correct Laterality, Correct Procedure,, site marked, surgical consent,, at surgeon's request Needles:  Injection technique: Single-shot  Needle Type: Other      Needle Gauge: 20 and 20 G    Additional Needles: Bier block (IV Regional) Narrative:   Performed by: Personally

## 2014-08-05 NOTE — Anesthesia Postprocedure Evaluation (Signed)
  Anesthesia Post-op Note  Patient: Anthony [name]  Procedure(s) Performed: Procedure(s): RIGHT SMALL TRIGGER RELEASE  (Right)  Patient Location: PACU  Anesthesia Type:Regional  Level of Consciousness: awake, alert , oriented and patient cooperative  Airway and Oxygen Therapy: Patient Spontanous Breathing  Post-op Pain: mild  Post-op Assessment: Post-op Vital signs reviewed, Patient's Cardiovascular Status Stable, Respiratory Function Stable, Patent Airway, No signs of Nausea or vomiting and Pain level controlled  Post-op Vital Signs: stable  Last Vitals:  Filed Vitals:   08/05/14 1630  BP:   Pulse: 57  Temp:   Resp: 12    Complications: No apparent anesthesia complications

## 2014-08-05 NOTE — Brief Op Note (Signed)
08/05/2014  4:26 PM  PATIENT:  Ander Purpura Meda  63 y.o. male  PRE-OPERATIVE DIAGNOSIS:  RIGHT SMALL TRIGGER DIGIT  POST-OPERATIVE DIAGNOSIS:  RIGHT SMALL TRIGGER DIGIT  PROCEDURE:  Procedure(s): RIGHT SMALL TRIGGER RELEASE  (Right)  SURGEON:  Surgeon(s) and Role:    * Leanora Cover, MD - Primary  PHYSICIAN ASSISTANT:   ASSISTANTS: none   ANESTHESIA:   Bier block  EBL:  Total I/O In: 800 [I.V.:800] Out: -   BLOOD ADMINISTERED:none  DRAINS: none   LOCAL MEDICATIONS USED:  MARCAINE     SPECIMEN:  No Specimen  DISPOSITION OF SPECIMEN:  N/A  COUNTS:  YES  TOURNIQUET:   Total Tourniquet Time Documented: Forearm (Right) - 16 minutes Total: Forearm (Right) - 16 minutes   DICTATION: .Other Dictation: Dictation Number [id number]  PLAN OF CARE: Discharge to home after PACU  PATIENT DISPOSITION:  PACU - hemodynamically stable.

## 2014-08-06 ENCOUNTER — Encounter (HOSPITAL_BASED_OUTPATIENT_CLINIC_OR_DEPARTMENT_OTHER): Payer: Self-pay | Admitting: Orthopedic Surgery

## 2014-08-06 LAB — POCT HEMOGLOBIN-HEMACUE: Hemoglobin: 14.8 g/dL (ref 13.0–17.0)

## 2014-08-06 NOTE — Op Note (Signed)
Anthony [name], [name] NO.:  [id number]  MEDICAL RECORD NO.:  [id number]  LOCATION:                                 FACILITY:  PHYSICIAN:  Leanora Cover, MD             DATE OF BIRTH:  DATE OF PROCEDURE:  08/05/2014 DATE OF DISCHARGE:                              OPERATIVE REPORT   PREOPERATIVE DIAGNOSIS:  Right small finger trigger digit.  POSTOPERATIVE DIAGNOSIS:  Right small finger trigger digit.  PROCEDURE:  Right small finger trigger release.  SURGEON:  Leanora Cover, MD  ASSISTANTS:  None.  ANESTHESIA:  Bier block.  IV FLUIDS:  Per anesthesia flow sheet.  ESTIMATED BLOOD LOSS:  Minimal.  COMPLICATIONS:  None.  SPECIMENS:  None.  TOURNIQUET TIME:  16 minutes.  DISPOSITION:  Stable to PACU.  INDICATIONS:  Anthony [name] is a 63 year old male who has had triggering of the right long finger.  This has been injected twice.  He wished to have a trigger release for management of symptoms.  Risks, benefits, and alternatives of surgery were discussed including risk of blood loss, infection, damage to nerves, vessels, tendons, ligaments, bone, failure of surgery, need for additional surgery, complications with wound healing, continued pain, recurrence of triggering.  He voiced understanding of these risks and elected to proceed.  OPERATIVE COURSE:  After being identified preoperatively by myself, the patient and I agreed upon procedure and site of procedure.  Surgical site was marked.  Risks, benefits, and alternatives of surgery were reviewed and he wished to proceed.  Surgical consent had been signed. He was given IV Ancef as preoperative antibiotic prophylaxis.  He was transported to the operating room, placed on the operating room table in supine position with the right upper extremity on arm board.  Bier block anesthesia was induced by anesthesiologist.  The right upper extremity was prepped and draped in normal sterile orthopedic fashion.  A  surgical pause was performed between surgeons, anesthesia, and operating room staff, and all were in agreement as to the patient, procedure, and site of procedure.  Tourniquet at the proximal aspect of the forearm had been inflated for the Bier block.  An incision was made over the MP joint of the small finger.  This was carried into subcutaneous tissues by spreading technique.  The A1 pulley was identified.  It was sharply incised.  The proximal millimeter or so of the A2 pulley was incised as well.  The tendons were adherent to each other.  They were brought back through the wound and the adhesions released.  The patient was able to make a complete fist and extend his fingers without any triggering.  The wound was copiously irrigated with sterile saline.  It was closed with 4- 0 nylon in a horizontal mattress fashion.  It was injected with 5 mL of 0.25% plain Marcaine to aid in postoperative analgesia.  It was then dressed with sterile Xeroform, 4x4s, and wrapped with a Kling and Coban dressing lightly.  Tourniquet was deflated at 16 minutes.  Fingertips were pink with brisk capillary refill after deflation of the tourniquet. The operative drapes were broken down and the patient was awoken from anesthesia  safely.  He was transferred back to the stretcher and taken to the PACU in stable condition.  I will see him back in the office in 1 week for postoperative followup.  I will give him Norco 5/325, 1-2 p.o. q.6 hours p.r.n. pain, dispensed #30.     Leanora Cover, MD     KK/MEDQ  D:  08/05/2014  T:  08/06/2014  Job:  [id number]

## 2015-03-31 ENCOUNTER — Ambulatory Visit (INDEPENDENT_AMBULATORY_CARE_PROVIDER_SITE_OTHER): Payer: 59 | Admitting: Internal Medicine

## 2015-03-31 ENCOUNTER — Encounter: Payer: Self-pay | Admitting: Internal Medicine

## 2015-03-31 VITALS — BP 134/82 | HR 65 | Ht 67.0 in | Wt 188.0 lb

## 2015-03-31 DIAGNOSIS — G4733 Obstructive sleep apnea (adult) (pediatric): Secondary | ICD-10-CM | POA: Diagnosis not present

## 2015-03-31 NOTE — Progress Notes (Signed)
12/10/12- 88 yoM former smoker referred courtesy of Dr Dorthy Cooler for sleep medicine evaluation.  Wife is here- a Marine scientist. Wakes several times at night, mouth dry. Vivid, active dreams. Limb jerks "move bed"; has kicked and fallen out of bed.  Bedtime varies. Variable sleep latency. Wakes 3-4x/ night with no regular getting up time.  Back sleeper, loud snore. Hx sinus surgery, tonsillectomy, allergic rhinitis Was on lexapro, xanax after death of parents.  NPSG 2012-10-28- AHI 16.2/ hr, weight 172 lbs, mild limb jerks.  03/08/13- 79 yoM former smoker referred courtesy of Dr Dorthy Cooler for sleep medicine evaluation.  Wife is here- a Marine scientist. FOLLOWS FOR: wears CPAP auto/ APS most of the time; some nights he is not around electricity . Camps and fishes away from electricity. CPAP works well some nights, but positional with nasal pillows mask.  Download indicates fixed presure 9.  03/28/14- 31 yoM former smoker referred courtesy of Dr Dorthy Cooler for sleep medicine evaluation.  Wife is here- a Marine scientist. FOLLOWS FOR: Wears CPAP 9 most every night for about 6-10 hours; DME is [facility].  03/31/15- 62 yoM former smoker followed for OSA, complicated by seasonal allergy, glaucoma, GERD CPAP 9/ APS FOLLOWS FOR: Pt states he wears his CPAP 9/ APS nightly for about 8 hours. Pt denies issues with mask, pressure or machine. Pt denies any complaints at this time.  Tried "boil and bite" mouthpiece which stopped his snoring but was not as comfortable and didn't work as well as CPAP. With CPAP he does not snore and he sleeps well.  ROS-see HPI Constitutional:   No-   weight loss, night sweats, fevers, chills, +fatigue, lassitude. HEENT:   +  headaches, +difficulty swallowing, tooth/dental problems, sore throat,       No-  sneezing, itching, ear ache, +nasal congestion, post nasal drip,  CV:  No-   chest pain, orthopnea, PND, swelling in lower extremities, anasarca, dizziness, palpitations Resp: No-   shortness of breath with exertion or  at rest.              No-   productive cough,  No non-productive cough,  No- coughing up of blood.              No-   change in color of mucus.  No- wheezing.   Skin: No-   rash or lesions. GI:  + heartburn,+ indigestion,  No-abdominal pain, nausea, vomiting,  GU: . MS:    +joint pain or swelling.  . Neuro-     nothing unusual Psych:  No- change in mood or affect. No depression, + anxiety.  No memory loss.  OBJ- Physical Exam General- Alert, Oriented, Affect-appropriate, Distress- none acute, medium build. +Small mandible. Skin- rash-none, lesions- none, excoriation- none Lymphadenopathy- none Head- atraumatic            Eyes- Gross vision intact, PERRLA, conjunctivae and secretions clear            Ears- Hearing, canals-normal            Nose- Clear, no-Septal dev, mucus, polyps, erosion, perforation             Throat- Mallampati II , mucosa clear , drainage- none, tonsils- atrophic.                        Own teeth, + retrognathia Neck- flexible , trachea midline, no stridor , thyroid nl, carotid no bruit Chest - symmetrical excursion , unlabored  Heart/CV- RRR , no murmur , no gallop  , no rub, nl s1 s2                           - JVD- none , edema- none, stasis changes- none, varices- none           Lung- clear to P&A, wheeze- none, cough- none , dullness-none, rub- none           Chest wall-  Abd-  Br/ Gen/ Rectal- Not done, not indicated Extrem- cyanosis- none, clubbing, none, atrophy- none, strength- nl Neuro- grossly intact to observation

## 2015-03-31 NOTE — Patient Instructions (Signed)
We can continue CPAP 9/ APS  Order- APS download CPAP for pressure compliance documentation     Dx OSA  Please call if we can help

## 2015-04-01 NOTE — Assessment & Plan Note (Signed)
Reports excellent compliance and control with CPAP 9/APS. He requires no change.

## 2015-04-25 ENCOUNTER — Encounter: Payer: Self-pay | Admitting: Internal Medicine

## 2015-06-25 DIAGNOSIS — Z79899 Other long term (current) drug therapy: Secondary | ICD-10-CM | POA: Diagnosis not present

## 2015-06-25 DIAGNOSIS — Z23 Encounter for immunization: Secondary | ICD-10-CM | POA: Diagnosis not present

## 2015-06-25 DIAGNOSIS — F419 Anxiety disorder, unspecified: Secondary | ICD-10-CM | POA: Diagnosis not present

## 2015-06-25 DIAGNOSIS — K219 Gastro-esophageal reflux disease without esophagitis: Secondary | ICD-10-CM | POA: Diagnosis not present

## 2015-06-25 DIAGNOSIS — R7301 Impaired fasting glucose: Secondary | ICD-10-CM | POA: Diagnosis not present

## 2015-06-25 DIAGNOSIS — E785 Hyperlipidemia, unspecified: Secondary | ICD-10-CM | POA: Diagnosis not present

## 2015-06-25 DIAGNOSIS — G4733 Obstructive sleep apnea (adult) (pediatric): Secondary | ICD-10-CM | POA: Diagnosis not present

## 2015-06-25 DIAGNOSIS — G4700 Insomnia, unspecified: Secondary | ICD-10-CM | POA: Diagnosis not present

## 2015-06-25 DIAGNOSIS — Z125 Encounter for screening for malignant neoplasm of prostate: Secondary | ICD-10-CM | POA: Diagnosis not present

## 2015-06-25 DIAGNOSIS — Z0000 Encounter for general adult medical examination without abnormal findings: Secondary | ICD-10-CM | POA: Diagnosis not present

## 2015-09-03 DIAGNOSIS — H401331 Pigmentary glaucoma, bilateral, mild stage: Secondary | ICD-10-CM | POA: Diagnosis not present

## 2015-09-11 ENCOUNTER — Telehealth: Payer: Self-pay | Admitting: Internal Medicine

## 2015-09-11 NOTE — Telephone Encounter (Signed)
Pt states that he needs OV for CPAP supplies in order to get CPAP supplies covered.  Pt has already received some supplies which are not covered by insurance. Needs OV ASAP for Medicare so that he can get his supplies covered for this month. DME: [facility]  Please advise Dr Annamaria Boots were patient can be worked in. Thanks.

## 2015-09-11 NOTE — Telephone Encounter (Signed)
Schedule patient for appt with CY 09/15/15 at 4pm per below Nothing further needed.

## 2015-09-11 NOTE — Telephone Encounter (Signed)
We can have patient come in on Monday 09-15-15 at 4:00pm. Thanks.

## 2015-09-15 ENCOUNTER — Ambulatory Visit (INDEPENDENT_AMBULATORY_CARE_PROVIDER_SITE_OTHER): Payer: Medicare Other | Admitting: Internal Medicine

## 2015-09-15 ENCOUNTER — Encounter: Payer: Self-pay | Admitting: Internal Medicine

## 2015-09-15 VITALS — BP 124/70 | HR 57 | Ht 67.0 in | Wt 193.6 lb

## 2015-09-15 DIAGNOSIS — G4733 Obstructive sleep apnea (adult) (pediatric): Secondary | ICD-10-CM

## 2015-09-15 NOTE — Patient Instructions (Addendum)
Order- DME [facility]-  Continue CPAP 9, mask of choice, humidifier, supplies, AirView   Dx OSA    Dx OSA                  Needs replacement mask and supplies- updating order  Please call as needed

## 2015-09-15 NOTE — Progress Notes (Signed)
12/10/12- 77 yoM former smoker referred courtesy of Dr Dorthy Cooler for sleep medicine evaluation.  Wife is here- a Marine scientist. Wakes several times at night, mouth dry. Vivid, active dreams. Limb jerks "move bed"; has kicked and fallen out of bed.  Bedtime varies. Variable sleep latency. Wakes 3-4x/ night with no regular getting up time.  Back sleeper, loud snore. Hx sinus surgery, tonsillectomy, allergic rhinitis Was on lexapro, xanax after death of parents.  NPSG 10/10/12- AHI 16.2/ hr, weight 172 lbs, mild limb jerks.  03/08/13- 12 yoM former smoker referred courtesy of Dr Dorthy Cooler for sleep medicine evaluation.  Wife is here- a Marine scientist. FOLLOWS FOR: wears CPAP auto/ [facility] most of the time; some nights he is not around electricity . Camps and fishes away from electricity. CPAP works well some nights, but positional with nasal pillows mask.  Download indicates fixed presure 9.  03/28/14- 78 yoM former smoker referred courtesy of Dr Dorthy Cooler for sleep medicine evaluation.  Wife is here- a Marine scientist. FOLLOWS FOR: Wears CPAP 9 most every night for about 6-10 hours; DME is [facility].  03/31/15- 62 yoM former smoker followed for OSA, complicated by seasonal allergy, glaucoma, GERD CPAP 9/ [facility] FOLLOWS FOR: Pt states he wears his CPAP 9/ [facility] nightly for about 8 hours. Pt denies issues with mask, pressure or machine. Pt denies any complaints at this time.  Tried "boil and bite" mouthpiece which stopped his snoring but was not as comfortable and didn't work as well as CPAP. With CPAP he does not snore and he sleeps well.  09/15/2015-65 year old male former smoker followed for OSA, complicated by seasonal allergy, glaucoma, GERD CPAP 9/ [facility] FOLLOWS FOR: Wears CPAP every night for about 6-8 hoursl DME is [facility]. Needs OV for Insurance to cover CPAP and supplies.Pt will need order sent to [facility] for supplies He turned in his download card-report pending. Sleeps well, wife happy that he doesn't snore.  ROS-see HPI Constitutional:    No-   weight loss, night sweats, fevers, chills, +fatigue, lassitude. HEENT:   +  headaches, +difficulty swallowing, tooth/dental problems, sore throat,       No-  sneezing, itching, ear ache, +nasal congestion, post nasal drip,  CV:  No-   chest pain, orthopnea, PND, swelling in lower extremities, anasarca, dizziness, palpitations Resp: No-   shortness of breath with exertion or at rest.              No-   productive cough,  No non-productive cough,  No- coughing up of blood.              No-   change in color of mucus.  No- wheezing.   Skin: No-   rash or lesions. GI:  + heartburn,+ indigestion,  No-abdominal pain, nausea, vomiting,  GU: . MS:    +joint pain or swelling.  . Neuro-     nothing unusual Psych:  No- change in mood or affect. No depression, + anxiety.  No memory loss.  OBJ- Physical Exam General- Alert, Oriented, Affect-appropriate, Distress- none acute, medium build. +Small mandible. Skin- rash-none, lesions- none, excoriation- none Lymphadenopathy- none Head- atraumatic            Eyes- Gross vision intact, PERRLA, conjunctivae and secretions clear            Ears- Hearing, canals-normal            Nose- Clear, no-Septal dev, mucus, polyps, erosion, perforation  Throat- Mallampati II , mucosa clear , drainage- none, tonsils- atrophic.                        Own teeth, + retrognathia Neck- flexible , trachea midline, no stridor , thyroid nl, carotid no bruit Chest - symmetrical excursion , unlabored           Heart/CV- RRR , no murmur , no gallop  , no rub, nl s1 s2                           - JVD- none , edema- none, stasis changes- none, varices- none           Lung- clear to P&A, wheeze- none, cough- none , dullness-none, rub- none           Chest wall-  Abd-  Br/ Gen/ Rectal- Not done, not indicated Extrem- cyanosis- none, clubbing, none, atrophy- none, strength- nl Neuro- grossly intact to observation

## 2015-09-16 NOTE — Assessment & Plan Note (Signed)
He describes improved quality of life and good compliance with CPAP 9.  Plan- download for documentation. Renewal order for CPAP/supplies.

## 2015-10-02 DIAGNOSIS — E7800 Pure hypercholesterolemia, unspecified: Secondary | ICD-10-CM | POA: Diagnosis not present

## 2015-10-02 DIAGNOSIS — F419 Anxiety disorder, unspecified: Secondary | ICD-10-CM | POA: Diagnosis not present

## 2015-10-02 DIAGNOSIS — L509 Urticaria, unspecified: Secondary | ICD-10-CM | POA: Diagnosis not present

## 2015-11-18 DIAGNOSIS — M65862 Other synovitis and tenosynovitis, left lower leg: Secondary | ICD-10-CM | POA: Diagnosis not present

## 2015-12-16 DIAGNOSIS — D1800 Hemangioma unspecified site: Secondary | ICD-10-CM | POA: Diagnosis not present

## 2015-12-16 DIAGNOSIS — H401331 Pigmentary glaucoma, bilateral, mild stage: Secondary | ICD-10-CM | POA: Diagnosis not present

## 2015-12-16 DIAGNOSIS — D225 Melanocytic nevi of trunk: Secondary | ICD-10-CM | POA: Diagnosis not present

## 2015-12-16 DIAGNOSIS — L309 Dermatitis, unspecified: Secondary | ICD-10-CM | POA: Diagnosis not present

## 2015-12-16 DIAGNOSIS — L821 Other seborrheic keratosis: Secondary | ICD-10-CM | POA: Diagnosis not present

## 2015-12-16 DIAGNOSIS — L814 Other melanin hyperpigmentation: Secondary | ICD-10-CM | POA: Diagnosis not present

## 2015-12-16 DIAGNOSIS — B351 Tinea unguium: Secondary | ICD-10-CM | POA: Diagnosis not present

## 2015-12-16 DIAGNOSIS — Z808 Family history of malignant neoplasm of other organs or systems: Secondary | ICD-10-CM | POA: Diagnosis not present

## 2015-12-16 DIAGNOSIS — L400 Psoriasis vulgaris: Secondary | ICD-10-CM | POA: Diagnosis not present

## 2015-12-16 DIAGNOSIS — R229 Localized swelling, mass and lump, unspecified: Secondary | ICD-10-CM | POA: Diagnosis not present

## 2016-01-27 ENCOUNTER — Telehealth: Payer: Self-pay | Admitting: Internal Medicine

## 2016-01-27 DIAGNOSIS — G4733 Obstructive sleep apnea (adult) (pediatric): Secondary | ICD-10-CM

## 2016-01-27 NOTE — Telephone Encounter (Signed)
Jan 2017 was last time patient got supplies. Pt then seen CY 09-2015; order has been placed and patient is aware that order is being sent to [facility] as STAT order. [place], our [facility], has taken care of the order and gotten order sent as STAT order. Nothing more needed at this time.

## 2016-03-09 DIAGNOSIS — H401331 Pigmentary glaucoma, bilateral, mild stage: Secondary | ICD-10-CM | POA: Diagnosis not present

## 2016-03-30 ENCOUNTER — Ambulatory Visit: Payer: 59 | Admitting: Internal Medicine

## 2016-04-07 DIAGNOSIS — G5621 Lesion of ulnar nerve, right upper limb: Secondary | ICD-10-CM | POA: Diagnosis not present

## 2016-04-07 DIAGNOSIS — M542 Cervicalgia: Secondary | ICD-10-CM | POA: Diagnosis not present

## 2016-04-07 DIAGNOSIS — M25521 Pain in right elbow: Secondary | ICD-10-CM | POA: Diagnosis not present

## 2016-04-09 DIAGNOSIS — F411 Generalized anxiety disorder: Secondary | ICD-10-CM | POA: Diagnosis not present

## 2016-04-09 DIAGNOSIS — Z23 Encounter for immunization: Secondary | ICD-10-CM | POA: Diagnosis not present

## 2016-04-09 DIAGNOSIS — M25562 Pain in left knee: Secondary | ICD-10-CM | POA: Diagnosis not present

## 2016-07-06 ENCOUNTER — Other Ambulatory Visit: Payer: Self-pay | Admitting: Family Medicine

## 2016-07-06 DIAGNOSIS — R3915 Urgency of urination: Secondary | ICD-10-CM | POA: Diagnosis not present

## 2016-07-06 DIAGNOSIS — Z136 Encounter for screening for cardiovascular disorders: Secondary | ICD-10-CM

## 2016-07-06 DIAGNOSIS — Z79899 Other long term (current) drug therapy: Secondary | ICD-10-CM | POA: Diagnosis not present

## 2016-07-06 DIAGNOSIS — Z23 Encounter for immunization: Secondary | ICD-10-CM | POA: Diagnosis not present

## 2016-07-06 DIAGNOSIS — Z0001 Encounter for general adult medical examination with abnormal findings: Secondary | ICD-10-CM | POA: Diagnosis not present

## 2016-07-06 DIAGNOSIS — H401331 Pigmentary glaucoma, bilateral, mild stage: Secondary | ICD-10-CM | POA: Diagnosis not present

## 2016-07-06 DIAGNOSIS — K219 Gastro-esophageal reflux disease without esophagitis: Secondary | ICD-10-CM | POA: Diagnosis not present

## 2016-07-06 DIAGNOSIS — R7309 Other abnormal glucose: Secondary | ICD-10-CM | POA: Diagnosis not present

## 2016-07-06 DIAGNOSIS — F419 Anxiety disorder, unspecified: Secondary | ICD-10-CM | POA: Diagnosis not present

## 2016-07-06 DIAGNOSIS — E7800 Pure hypercholesterolemia, unspecified: Secondary | ICD-10-CM | POA: Diagnosis not present

## 2016-07-06 DIAGNOSIS — Z125 Encounter for screening for malignant neoplasm of prostate: Secondary | ICD-10-CM | POA: Diagnosis not present

## 2016-07-06 DIAGNOSIS — G4700 Insomnia, unspecified: Secondary | ICD-10-CM | POA: Diagnosis not present

## 2016-07-15 ENCOUNTER — Ambulatory Visit: Payer: Medicare Other

## 2016-07-22 ENCOUNTER — Ambulatory Visit
Admission: RE | Admit: 2016-07-22 | Discharge: 2016-07-22 | Disposition: A | Discharge status: Home / Self Care | Payer: Medicare Other | Source: Ambulatory Visit | Attending: Family Medicine | Admitting: Family Medicine

## 2016-07-22 DIAGNOSIS — Z136 Encounter for screening for cardiovascular disorders: Secondary | ICD-10-CM

## 2016-07-22 DIAGNOSIS — Z87891 Personal history of nicotine dependence: Secondary | ICD-10-CM | POA: Diagnosis not present

## 2016-08-30 DIAGNOSIS — N400 Enlarged prostate without lower urinary tract symptoms: Secondary | ICD-10-CM | POA: Diagnosis not present

## 2016-08-30 DIAGNOSIS — N3281 Overactive bladder: Secondary | ICD-10-CM | POA: Diagnosis not present

## 2016-09-13 ENCOUNTER — Encounter: Payer: Self-pay | Admitting: Internal Medicine

## 2016-09-14 ENCOUNTER — Ambulatory Visit (INDEPENDENT_AMBULATORY_CARE_PROVIDER_SITE_OTHER): Payer: Medicare Other | Admitting: Internal Medicine

## 2016-09-14 ENCOUNTER — Ambulatory Visit (INDEPENDENT_AMBULATORY_CARE_PROVIDER_SITE_OTHER)
Admission: RE | Admit: 2016-09-14 | Discharge: 2016-09-14 | Disposition: A | Discharge status: Home / Self Care | Payer: Medicare Other | Source: Ambulatory Visit | Attending: Internal Medicine | Admitting: Internal Medicine

## 2016-09-14 ENCOUNTER — Encounter: Payer: Self-pay | Admitting: Internal Medicine

## 2016-09-14 VITALS — BP 128/82 | HR 50 | Ht 67.0 in | Wt 190.8 lb

## 2016-09-14 DIAGNOSIS — Z77090 Contact with and (suspected) exposure to asbestos: Secondary | ICD-10-CM

## 2016-09-14 DIAGNOSIS — J9809 Other diseases of bronchus, not elsewhere classified: Secondary | ICD-10-CM | POA: Diagnosis not present

## 2016-09-14 DIAGNOSIS — G4733 Obstructive sleep apnea (adult) (pediatric): Secondary | ICD-10-CM

## 2016-09-14 HISTORY — DX: Contact with and (suspected) exposure to asbestos: Z77.090

## 2016-09-14 NOTE — Assessment & Plan Note (Signed)
Educated on asbestos associated diseases and surveillance. Plan-CXR today and every 2 or 3 years

## 2016-09-14 NOTE — Patient Instructions (Signed)
Print script: CPAP machine of choice, mask of choice, auto pap 7-20, supplies, humidifier   Dx OSA  Order- DME [facility] please change autopap range to 7-20, continue mask of choice, humidifier, supplies,    Dx OSA  Order CXR    Dx asbestos exposure  Please call as needed

## 2016-09-14 NOTE — Progress Notes (Signed)
HPI male former smoker followed for OSA, complicated by seasonal allergy, glaucoma, GERD NPSG 10/01/12- AHI 16.2/ hr, weight 172 lbs, mild limb jerks  ----------------------------------------------------------------------------------  09/15/2015-66 year old male former smoker followed for OSA, , complicated by seasonal allergy, glaucoma, GERD CPAP 9/ [facility] FOLLOWS FOR: Wears CPAP every night for about 6-8 hoursl DME is [facility]. Needs OV for Insurance to cover CPAP and supplies.Pt will need order sent to [facility] for supplies He turned in his download card-report pending. Sleeps well, wife happy that he doesn't snore.  09/14/2016-66 year old male former smoker followed for OSA, asbestos exposure, complicated by seasonal allergy, glaucoma, GERD CPAP auto 7-20/[facility] 1 year OSA follow up. Denies any breathing complaints.  We reviewed his download showing excellent compliance and control with AutoPap pressure 5-20. He says sometimes he feels he has to pull to get enough air. Sleeps well otherwise and definitely feels better with CPAP. Discussed available small travel CPAP machines. 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 We discussed surveillance, symptoms suggestive of asbestos associated disease. He denies routine cough, chest pain.  ROS-see HPI Constitutional:   No-   weight loss, night sweats, fevers, chills, fatigue, lassitude. HEENT:   +  headaches, +difficulty swallowing, tooth/dental problems, sore throat,       No-  sneezing, itching, ear ache, +nasal congestion, post nasal drip,  CV:  No-   chest pain, orthopnea, PND, swelling in lower extremities, anasarca, dizziness, palpitations Resp: No-   shortness of breath with exertion or at rest.              No-   productive cough,  No non-productive cough,  No- coughing up of blood.              No-   change in color of mucus.  No- wheezing.   Skin: No-   rash or lesions. GI:   + heartburn,+ indigestion,  No-abdominal pain, nausea, vomiting,  GU: . MS:    +joint pain or swelling.  . Neuro-     nothing unusual Psych:  No- change in mood or affect. No depression, + anxiety.  No memory loss.  OBJ- Physical Exam   + = pos General- Alert, Oriented, Affect-appropriate, Distress- none acute, + overweight. +Small mandible. Skin- rash-none, lesions- none, excoriation- none Lymphadenopathy- none Head- atraumatic            Eyes- Gross vision intact, PERRLA, conjunctivae and secretions clear            Ears- Hearing, canals-normal            Nose- Clear, no-Septal dev, mucus, polyps, erosion, perforation             Throat- Mallampati II , mucosa clear , drainage- none, tonsils- atrophic.                        Own teeth, + retrognathia Neck- flexible , trachea midline, no stridor , thyroid nl, carotid no bruit Chest - symmetrical excursion , unlabored           Heart/CV- RRR , no murmur , no gallop  , no rub, nl s1 s2                           - JVD- none , edema- none, stasis changes- none, varices- none  Lung- clear to P&A/ no crackles heard, wheeze- none, cough- none , dullness-none, rub- none           Chest wall-  Abd-  Br/ Gen/ Rectal- Not done, not indicated Extrem- cyanosis- none, clubbing, none, atrophy- none, strength- nl Neuro- grossly intact to observation

## 2016-09-14 NOTE — Assessment & Plan Note (Signed)
Since he feels sometimes he has to pull little to get comfortable airflow, we will raise the lower end of his pressure range to 7-20

## 2016-10-12 DIAGNOSIS — H401331 Pigmentary glaucoma, bilateral, mild stage: Secondary | ICD-10-CM | POA: Diagnosis not present

## 2016-10-15 DIAGNOSIS — M7711 Lateral epicondylitis, right elbow: Secondary | ICD-10-CM | POA: Diagnosis not present

## 2016-10-20 DIAGNOSIS — M7711 Lateral epicondylitis, right elbow: Secondary | ICD-10-CM | POA: Diagnosis not present

## 2016-10-20 DIAGNOSIS — R29898 Other symptoms and signs involving the musculoskeletal system: Secondary | ICD-10-CM | POA: Diagnosis not present

## 2016-10-20 DIAGNOSIS — M25631 Stiffness of right wrist, not elsewhere classified: Secondary | ICD-10-CM | POA: Diagnosis not present

## 2016-10-20 DIAGNOSIS — M25421 Effusion, right elbow: Secondary | ICD-10-CM | POA: Diagnosis not present

## 2016-10-20 DIAGNOSIS — M79631 Pain in right forearm: Secondary | ICD-10-CM | POA: Diagnosis not present

## 2016-10-21 DIAGNOSIS — J0100 Acute maxillary sinusitis, unspecified: Secondary | ICD-10-CM | POA: Diagnosis not present

## 2016-10-21 DIAGNOSIS — B338 Other specified viral diseases: Secondary | ICD-10-CM | POA: Diagnosis not present

## 2016-10-25 DIAGNOSIS — M7711 Lateral epicondylitis, right elbow: Secondary | ICD-10-CM | POA: Diagnosis not present

## 2016-10-25 DIAGNOSIS — R29898 Other symptoms and signs involving the musculoskeletal system: Secondary | ICD-10-CM | POA: Diagnosis not present

## 2016-10-25 DIAGNOSIS — M25421 Effusion, right elbow: Secondary | ICD-10-CM | POA: Diagnosis not present

## 2016-10-25 DIAGNOSIS — M79631 Pain in right forearm: Secondary | ICD-10-CM | POA: Diagnosis not present

## 2016-10-25 DIAGNOSIS — M25649 Stiffness of unspecified hand, not elsewhere classified: Secondary | ICD-10-CM | POA: Diagnosis not present

## 2016-10-25 DIAGNOSIS — M25631 Stiffness of right wrist, not elsewhere classified: Secondary | ICD-10-CM | POA: Diagnosis not present

## 2016-10-27 DIAGNOSIS — R29898 Other symptoms and signs involving the musculoskeletal system: Secondary | ICD-10-CM | POA: Diagnosis not present

## 2016-10-27 DIAGNOSIS — M7711 Lateral epicondylitis, right elbow: Secondary | ICD-10-CM | POA: Diagnosis not present

## 2016-10-27 DIAGNOSIS — M79631 Pain in right forearm: Secondary | ICD-10-CM | POA: Diagnosis not present

## 2016-10-27 DIAGNOSIS — M25631 Stiffness of right wrist, not elsewhere classified: Secondary | ICD-10-CM | POA: Diagnosis not present

## 2016-12-15 DIAGNOSIS — N400 Enlarged prostate without lower urinary tract symptoms: Secondary | ICD-10-CM | POA: Diagnosis not present

## 2016-12-15 DIAGNOSIS — N3281 Overactive bladder: Secondary | ICD-10-CM | POA: Diagnosis not present

## 2016-12-17 DIAGNOSIS — M7711 Lateral epicondylitis, right elbow: Secondary | ICD-10-CM | POA: Diagnosis not present

## 2016-12-17 DIAGNOSIS — R29898 Other symptoms and signs involving the musculoskeletal system: Secondary | ICD-10-CM | POA: Diagnosis not present

## 2016-12-17 DIAGNOSIS — M79631 Pain in right forearm: Secondary | ICD-10-CM | POA: Diagnosis not present

## 2016-12-21 DIAGNOSIS — R29898 Other symptoms and signs involving the musculoskeletal system: Secondary | ICD-10-CM | POA: Diagnosis not present

## 2016-12-21 DIAGNOSIS — M7711 Lateral epicondylitis, right elbow: Secondary | ICD-10-CM | POA: Diagnosis not present

## 2016-12-21 DIAGNOSIS — M79631 Pain in right forearm: Secondary | ICD-10-CM | POA: Diagnosis not present

## 2017-01-10 DIAGNOSIS — H1013 Acute atopic conjunctivitis, bilateral: Secondary | ICD-10-CM | POA: Diagnosis not present

## 2017-01-11 DIAGNOSIS — L219 Seborrheic dermatitis, unspecified: Secondary | ICD-10-CM | POA: Diagnosis not present

## 2017-01-11 DIAGNOSIS — L821 Other seborrheic keratosis: Secondary | ICD-10-CM | POA: Diagnosis not present

## 2017-01-11 DIAGNOSIS — L400 Psoriasis vulgaris: Secondary | ICD-10-CM | POA: Diagnosis not present

## 2017-01-11 DIAGNOSIS — L814 Other melanin hyperpigmentation: Secondary | ICD-10-CM | POA: Diagnosis not present

## 2017-01-11 DIAGNOSIS — D1800 Hemangioma unspecified site: Secondary | ICD-10-CM | POA: Diagnosis not present

## 2017-01-11 DIAGNOSIS — D225 Melanocytic nevi of trunk: Secondary | ICD-10-CM | POA: Diagnosis not present

## 2017-01-11 DIAGNOSIS — Z808 Family history of malignant neoplasm of other organs or systems: Secondary | ICD-10-CM | POA: Diagnosis not present

## 2017-01-25 DIAGNOSIS — H401331 Pigmentary glaucoma, bilateral, mild stage: Secondary | ICD-10-CM | POA: Diagnosis not present

## 2017-03-10 DIAGNOSIS — M25562 Pain in left knee: Secondary | ICD-10-CM | POA: Diagnosis not present

## 2017-03-10 DIAGNOSIS — G8929 Other chronic pain: Secondary | ICD-10-CM | POA: Diagnosis not present

## 2017-03-29 DIAGNOSIS — M25562 Pain in left knee: Secondary | ICD-10-CM | POA: Diagnosis not present

## 2017-03-29 DIAGNOSIS — G8929 Other chronic pain: Secondary | ICD-10-CM | POA: Diagnosis not present

## 2017-04-05 DIAGNOSIS — F419 Anxiety disorder, unspecified: Secondary | ICD-10-CM | POA: Diagnosis not present

## 2017-04-05 DIAGNOSIS — R61 Generalized hyperhidrosis: Secondary | ICD-10-CM | POA: Diagnosis not present

## 2017-04-05 DIAGNOSIS — M25562 Pain in left knee: Secondary | ICD-10-CM | POA: Diagnosis not present

## 2017-04-05 DIAGNOSIS — Z23 Encounter for immunization: Secondary | ICD-10-CM | POA: Diagnosis not present

## 2017-04-05 DIAGNOSIS — G8929 Other chronic pain: Secondary | ICD-10-CM | POA: Diagnosis not present

## 2017-05-10 DIAGNOSIS — M222X2 Patellofemoral disorders, left knee: Secondary | ICD-10-CM | POA: Diagnosis not present

## 2017-05-10 DIAGNOSIS — M5412 Radiculopathy, cervical region: Secondary | ICD-10-CM | POA: Diagnosis not present

## 2017-05-10 DIAGNOSIS — M25562 Pain in left knee: Secondary | ICD-10-CM | POA: Diagnosis not present

## 2017-05-10 DIAGNOSIS — M25462 Effusion, left knee: Secondary | ICD-10-CM | POA: Diagnosis not present

## 2017-05-10 DIAGNOSIS — M1712 Unilateral primary osteoarthritis, left knee: Secondary | ICD-10-CM | POA: Diagnosis not present

## 2017-05-17 ENCOUNTER — Other Ambulatory Visit: Payer: Self-pay | Admitting: Family Medicine

## 2017-05-17 ENCOUNTER — Ambulatory Visit
Admission: RE | Admit: 2017-05-17 | Discharge: 2017-05-17 | Disposition: A | Discharge status: Home / Self Care | Payer: Medicare Other | Source: Ambulatory Visit | Attending: Family Medicine | Admitting: Family Medicine

## 2017-05-17 DIAGNOSIS — F419 Anxiety disorder, unspecified: Secondary | ICD-10-CM | POA: Diagnosis not present

## 2017-05-17 DIAGNOSIS — R61 Generalized hyperhidrosis: Secondary | ICD-10-CM

## 2017-05-17 DIAGNOSIS — H401331 Pigmentary glaucoma, bilateral, mild stage: Secondary | ICD-10-CM | POA: Diagnosis not present

## 2017-05-24 DIAGNOSIS — M25562 Pain in left knee: Secondary | ICD-10-CM | POA: Diagnosis not present

## 2017-05-24 DIAGNOSIS — M1712 Unilateral primary osteoarthritis, left knee: Secondary | ICD-10-CM | POA: Diagnosis not present

## 2017-05-24 DIAGNOSIS — M545 Low back pain: Secondary | ICD-10-CM | POA: Diagnosis not present

## 2017-05-31 DIAGNOSIS — M1712 Unilateral primary osteoarthritis, left knee: Secondary | ICD-10-CM | POA: Diagnosis not present

## 2017-06-02 DIAGNOSIS — M545 Low back pain: Secondary | ICD-10-CM | POA: Diagnosis not present

## 2017-06-07 DIAGNOSIS — M25562 Pain in left knee: Secondary | ICD-10-CM | POA: Diagnosis not present

## 2017-06-07 DIAGNOSIS — M1712 Unilateral primary osteoarthritis, left knee: Secondary | ICD-10-CM | POA: Diagnosis not present

## 2017-06-07 DIAGNOSIS — M545 Low back pain: Secondary | ICD-10-CM | POA: Diagnosis not present

## 2017-06-08 DIAGNOSIS — M47817 Spondylosis without myelopathy or radiculopathy, lumbosacral region: Secondary | ICD-10-CM | POA: Diagnosis not present

## 2017-06-08 DIAGNOSIS — Z79899 Other long term (current) drug therapy: Secondary | ICD-10-CM | POA: Diagnosis not present

## 2017-06-08 DIAGNOSIS — M545 Low back pain, unspecified: Secondary | ICD-10-CM | POA: Insufficient documentation

## 2017-06-16 DIAGNOSIS — M545 Low back pain: Secondary | ICD-10-CM | POA: Diagnosis not present

## 2017-06-16 DIAGNOSIS — M5417 Radiculopathy, lumbosacral region: Secondary | ICD-10-CM | POA: Diagnosis not present

## 2017-06-16 DIAGNOSIS — M47817 Spondylosis without myelopathy or radiculopathy, lumbosacral region: Secondary | ICD-10-CM | POA: Diagnosis not present

## 2017-07-07 DIAGNOSIS — E7800 Pure hypercholesterolemia, unspecified: Secondary | ICD-10-CM | POA: Diagnosis not present

## 2017-07-07 DIAGNOSIS — Z0001 Encounter for general adult medical examination with abnormal findings: Secondary | ICD-10-CM | POA: Diagnosis not present

## 2017-07-07 DIAGNOSIS — Z79899 Other long term (current) drug therapy: Secondary | ICD-10-CM | POA: Diagnosis not present

## 2017-07-07 DIAGNOSIS — G4700 Insomnia, unspecified: Secondary | ICD-10-CM | POA: Diagnosis not present

## 2017-07-07 DIAGNOSIS — M25512 Pain in left shoulder: Secondary | ICD-10-CM | POA: Diagnosis not present

## 2017-07-07 DIAGNOSIS — G4733 Obstructive sleep apnea (adult) (pediatric): Secondary | ICD-10-CM | POA: Diagnosis not present

## 2017-07-07 DIAGNOSIS — Z125 Encounter for screening for malignant neoplasm of prostate: Secondary | ICD-10-CM | POA: Diagnosis not present

## 2017-07-07 DIAGNOSIS — K219 Gastro-esophageal reflux disease without esophagitis: Secondary | ICD-10-CM | POA: Diagnosis not present

## 2017-07-07 DIAGNOSIS — F419 Anxiety disorder, unspecified: Secondary | ICD-10-CM | POA: Diagnosis not present

## 2017-07-15 DIAGNOSIS — M5417 Radiculopathy, lumbosacral region: Secondary | ICD-10-CM | POA: Diagnosis not present

## 2017-07-15 DIAGNOSIS — M47817 Spondylosis without myelopathy or radiculopathy, lumbosacral region: Secondary | ICD-10-CM | POA: Diagnosis not present

## 2017-07-15 DIAGNOSIS — M545 Low back pain: Secondary | ICD-10-CM | POA: Diagnosis not present

## 2017-07-31 DIAGNOSIS — M791 Myalgia, unspecified site: Secondary | ICD-10-CM | POA: Diagnosis not present

## 2017-07-31 DIAGNOSIS — J329 Chronic sinusitis, unspecified: Secondary | ICD-10-CM | POA: Diagnosis not present

## 2017-07-31 DIAGNOSIS — Z6830 Body mass index (BMI) 30.0-30.9, adult: Secondary | ICD-10-CM | POA: Diagnosis not present

## 2017-08-13 DIAGNOSIS — R0602 Shortness of breath: Secondary | ICD-10-CM | POA: Diagnosis not present

## 2017-08-13 DIAGNOSIS — E7800 Pure hypercholesterolemia, unspecified: Secondary | ICD-10-CM | POA: Diagnosis not present

## 2017-08-13 DIAGNOSIS — J329 Chronic sinusitis, unspecified: Secondary | ICD-10-CM | POA: Diagnosis not present

## 2017-08-13 DIAGNOSIS — R42 Dizziness and giddiness: Secondary | ICD-10-CM | POA: Diagnosis not present

## 2017-08-13 DIAGNOSIS — R0789 Other chest pain: Secondary | ICD-10-CM | POA: Diagnosis not present

## 2017-08-13 DIAGNOSIS — Z881 Allergy status to other antibiotic agents status: Secondary | ICD-10-CM | POA: Diagnosis not present

## 2017-08-13 DIAGNOSIS — G4733 Obstructive sleep apnea (adult) (pediatric): Secondary | ICD-10-CM | POA: Diagnosis not present

## 2017-08-13 DIAGNOSIS — E871 Hypo-osmolality and hyponatremia: Secondary | ICD-10-CM | POA: Diagnosis not present

## 2017-08-13 DIAGNOSIS — M79604 Pain in right leg: Secondary | ICD-10-CM | POA: Diagnosis not present

## 2017-08-13 DIAGNOSIS — L509 Urticaria, unspecified: Secondary | ICD-10-CM | POA: Diagnosis not present

## 2017-08-13 DIAGNOSIS — R531 Weakness: Secondary | ICD-10-CM | POA: Diagnosis not present

## 2017-08-13 DIAGNOSIS — I2699 Other pulmonary embolism without acute cor pulmonale: Secondary | ICD-10-CM | POA: Diagnosis not present

## 2017-08-13 DIAGNOSIS — R55 Syncope and collapse: Secondary | ICD-10-CM | POA: Diagnosis not present

## 2017-08-13 DIAGNOSIS — R0600 Dyspnea, unspecified: Secondary | ICD-10-CM | POA: Diagnosis not present

## 2017-08-13 DIAGNOSIS — F419 Anxiety disorder, unspecified: Secondary | ICD-10-CM | POA: Diagnosis not present

## 2017-08-13 DIAGNOSIS — R739 Hyperglycemia, unspecified: Secondary | ICD-10-CM | POA: Diagnosis not present

## 2017-08-13 DIAGNOSIS — I10 Essential (primary) hypertension: Secondary | ICD-10-CM | POA: Diagnosis not present

## 2017-08-13 DIAGNOSIS — I672 Cerebral atherosclerosis: Secondary | ICD-10-CM | POA: Diagnosis not present

## 2017-08-13 DIAGNOSIS — Z7901 Long term (current) use of anticoagulants: Secondary | ICD-10-CM | POA: Diagnosis not present

## 2017-08-14 DIAGNOSIS — R0602 Shortness of breath: Secondary | ICD-10-CM | POA: Diagnosis not present

## 2017-08-14 DIAGNOSIS — J329 Chronic sinusitis, unspecified: Secondary | ICD-10-CM | POA: Diagnosis not present

## 2017-08-14 DIAGNOSIS — G4733 Obstructive sleep apnea (adult) (pediatric): Secondary | ICD-10-CM | POA: Diagnosis not present

## 2017-08-14 DIAGNOSIS — E871 Hypo-osmolality and hyponatremia: Secondary | ICD-10-CM | POA: Diagnosis not present

## 2017-08-14 DIAGNOSIS — F419 Anxiety disorder, unspecified: Secondary | ICD-10-CM | POA: Diagnosis not present

## 2017-08-14 DIAGNOSIS — R0600 Dyspnea, unspecified: Secondary | ICD-10-CM | POA: Diagnosis not present

## 2017-08-14 DIAGNOSIS — M79604 Pain in right leg: Secondary | ICD-10-CM | POA: Diagnosis not present

## 2017-08-14 DIAGNOSIS — R55 Syncope and collapse: Secondary | ICD-10-CM | POA: Diagnosis not present

## 2017-08-14 DIAGNOSIS — R42 Dizziness and giddiness: Secondary | ICD-10-CM | POA: Diagnosis not present

## 2017-08-14 DIAGNOSIS — R739 Hyperglycemia, unspecified: Secondary | ICD-10-CM | POA: Diagnosis not present

## 2017-08-14 DIAGNOSIS — I2699 Other pulmonary embolism without acute cor pulmonale: Secondary | ICD-10-CM | POA: Diagnosis not present

## 2017-08-14 DIAGNOSIS — L509 Urticaria, unspecified: Secondary | ICD-10-CM | POA: Diagnosis not present

## 2017-08-15 ENCOUNTER — Telehealth: Payer: Self-pay | Admitting: Internal Medicine

## 2017-08-15 DIAGNOSIS — M79604 Pain in right leg: Secondary | ICD-10-CM | POA: Diagnosis not present

## 2017-08-15 DIAGNOSIS — R0600 Dyspnea, unspecified: Secondary | ICD-10-CM | POA: Diagnosis not present

## 2017-08-15 DIAGNOSIS — I270 Primary pulmonary hypertension: Secondary | ICD-10-CM | POA: Diagnosis not present

## 2017-08-15 DIAGNOSIS — I701 Atherosclerosis of renal artery: Secondary | ICD-10-CM | POA: Diagnosis not present

## 2017-08-15 DIAGNOSIS — J329 Chronic sinusitis, unspecified: Secondary | ICD-10-CM | POA: Diagnosis not present

## 2017-08-15 DIAGNOSIS — R0602 Shortness of breath: Secondary | ICD-10-CM | POA: Diagnosis not present

## 2017-08-15 DIAGNOSIS — R42 Dizziness and giddiness: Secondary | ICD-10-CM | POA: Diagnosis not present

## 2017-08-15 DIAGNOSIS — R739 Hyperglycemia, unspecified: Secondary | ICD-10-CM | POA: Diagnosis not present

## 2017-08-15 DIAGNOSIS — E871 Hypo-osmolality and hyponatremia: Secondary | ICD-10-CM | POA: Diagnosis not present

## 2017-08-15 DIAGNOSIS — G4733 Obstructive sleep apnea (adult) (pediatric): Secondary | ICD-10-CM | POA: Diagnosis not present

## 2017-08-15 DIAGNOSIS — I2699 Other pulmonary embolism without acute cor pulmonale: Secondary | ICD-10-CM | POA: Diagnosis not present

## 2017-08-15 DIAGNOSIS — R55 Syncope and collapse: Secondary | ICD-10-CM | POA: Diagnosis not present

## 2017-08-15 DIAGNOSIS — F419 Anxiety disorder, unspecified: Secondary | ICD-10-CM | POA: Diagnosis not present

## 2017-08-15 DIAGNOSIS — L509 Urticaria, unspecified: Secondary | ICD-10-CM | POA: Diagnosis not present

## 2017-08-15 NOTE — Telephone Encounter (Signed)
Called and spouse with pt's spouse, Thayer Headings.  Thayer Headings states pt is was admitted in [place] on Saturday for 3 PE's. 1 PE is RLL, 1RML, and 1 LLL.  Thayer Headings states pt will be discharged today. Thayer Headings is requesting OV within one week with CY, as CY sees pt for OSA. Pt currently on Eliquis.  CY please advise. Thanks.   Current Outpatient Medications on File Prior to Visit  Medication Sig Dispense Refill  . atorvastatin (LIPITOR) 10 MG tablet Take 10 mg by mouth daily.  3  . escitalopram (LEXAPRO) 10 MG tablet Take 10 mg by mouth daily.    Marland Kitchen loratadine (CLARITIN) 10 MG tablet Take 10 mg by mouth as needed for allergies.    . Multiple Vitamins-Minerals (PRESERVISION AREDS PO) Take 1 capsule by mouth daily.    . ranitidine (ZANTAC) 150 MG capsule Take 150 mg by mouth as needed for heartburn.    . timolol (BETIMOL) 0.5 % ophthalmic solution Place 1 drop into both eyes daily.     No current facility-administered medications on file prior to visit.     Allergies  Allergen Reactions  . Nsaids     Gi upset

## 2017-08-15 NOTE — Telephone Encounter (Signed)
Called and spoke to pt's wife, Thayer Headings letting her know we could get pt in for a HFU with CY 08/22/17 at 11:30. Thayer Headings expressed understanding. Appt made for pt. Nothing further needed.

## 2017-08-15 NOTE — Telephone Encounter (Signed)
We can see patient on Monday 08-22-17 at 11:30am for HFU-okay to double book. Thanks.

## 2017-08-16 DIAGNOSIS — J329 Chronic sinusitis, unspecified: Secondary | ICD-10-CM | POA: Diagnosis not present

## 2017-08-16 DIAGNOSIS — E871 Hypo-osmolality and hyponatremia: Secondary | ICD-10-CM | POA: Diagnosis not present

## 2017-08-16 DIAGNOSIS — L509 Urticaria, unspecified: Secondary | ICD-10-CM | POA: Diagnosis not present

## 2017-08-16 DIAGNOSIS — R739 Hyperglycemia, unspecified: Secondary | ICD-10-CM | POA: Diagnosis not present

## 2017-08-16 DIAGNOSIS — R0602 Shortness of breath: Secondary | ICD-10-CM | POA: Diagnosis not present

## 2017-08-16 DIAGNOSIS — F419 Anxiety disorder, unspecified: Secondary | ICD-10-CM | POA: Diagnosis not present

## 2017-08-16 DIAGNOSIS — I2699 Other pulmonary embolism without acute cor pulmonale: Secondary | ICD-10-CM | POA: Diagnosis not present

## 2017-08-16 DIAGNOSIS — R42 Dizziness and giddiness: Secondary | ICD-10-CM | POA: Diagnosis not present

## 2017-08-16 DIAGNOSIS — M79604 Pain in right leg: Secondary | ICD-10-CM | POA: Diagnosis not present

## 2017-08-16 DIAGNOSIS — G4733 Obstructive sleep apnea (adult) (pediatric): Secondary | ICD-10-CM | POA: Diagnosis not present

## 2017-08-16 DIAGNOSIS — R55 Syncope and collapse: Secondary | ICD-10-CM | POA: Diagnosis not present

## 2017-08-16 DIAGNOSIS — R0600 Dyspnea, unspecified: Secondary | ICD-10-CM | POA: Diagnosis not present

## 2017-08-18 DIAGNOSIS — I2699 Other pulmonary embolism without acute cor pulmonale: Secondary | ICD-10-CM | POA: Diagnosis not present

## 2017-08-22 ENCOUNTER — Encounter: Payer: Self-pay | Admitting: Internal Medicine

## 2017-08-22 ENCOUNTER — Ambulatory Visit (INDEPENDENT_AMBULATORY_CARE_PROVIDER_SITE_OTHER): Payer: Medicare Other | Admitting: Internal Medicine

## 2017-08-22 DIAGNOSIS — I2699 Other pulmonary embolism without acute cor pulmonale: Secondary | ICD-10-CM

## 2017-08-22 DIAGNOSIS — G4733 Obstructive sleep apnea (adult) (pediatric): Secondary | ICD-10-CM

## 2017-08-22 DIAGNOSIS — Z77090 Contact with and (suspected) exposure to asbestos: Secondary | ICD-10-CM | POA: Diagnosis not present

## 2017-08-22 HISTORY — DX: Other pulmonary embolism without acute cor pulmonale: I26.99

## 2017-08-22 NOTE — Assessment & Plan Note (Signed)
Continued good compliance and control with auto 7-20/[facility].  No changes needed.  He feels better using CPAP.

## 2017-08-22 NOTE — Progress Notes (Signed)
HPI male former smoker followed for OSA, PE 10/5007, complicated by seasonal allergy, glaucoma, GERD, Urticaria NPSG 10/01/12- AHI 16.2/ hr, weight 172 lbs, mild limb jerks  ----------------------------------------------------------------------------------  09/14/2016-67 year old male former smoker followed for OSA, asbestos exposure, complicated by seasonal allergy, glaucoma, GERD CPAP auto 7-20/[facility] 1 year OSA follow up. Denies any breathing complaints.  We reviewed his download showing excellent compliance and control with AutoPap pressure 5-20. He says sometimes he feels he has to pull to get enough air. Sleeps well otherwise and definitely feels better with CPAP. Discussed available small travel CPAP machines. 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 We discussed surveillance, symptoms suggestive of asbestos associated disease. He denies routine cough, chest pain.  08/22/17- 67 year old male former smoker followed for OSA, asbestos exposure, PE/ Eliquis , complicated by seasonal allergy, glaucoma, GERD CPAP auto 7-20/[facility]  ----[facility] in [place])-PE in both lungs.  Unprovoked PE.  Right calf pain 2 weeks before Christmas.  Orthostatic dizziness and substernal pleuritic chest pain.CTa 08/15/17 showed stable multiple pulmonary emboli.  CT of head, abdomen, pelvis all negative for neoplasm.  Doppler ultrasound of legs reportedly negative by that time.  Echocardiogram reported EF 55-60% without RV strain.  He drives back and forth between [place] and [place].  Had gel injections into L knee for arthritis in November.  Breathing now feels normal with minimal residual substernal discomfort on forced a deep breath only.  No bleeding or headache.  He has established for primary care at [facility] [phone] in [place].  No previous clotting per history.  Mother had PE after MVA  with multiple trauma. Additional problem-urticaria times 2 years, intermittent, triggered by pressure.  History of rash/hives after Augmentin. OSA-CPAP doing very well.  Definitely sleeps better with it and wife confirms.  Download compliance 91%, AHI 1.3/hour. CXR 05/18/17 IMPRESSION: Stable and negative.  No acute cardiopulmonary abnormality.  ROS-see HPI + = positive Constitutional:   No-   weight loss, night sweats, fevers, chills, fatigue, lassitude. HEENT:   +  headaches, +difficulty swallowing, tooth/dental problems, sore throat,       No-  sneezing, itching, ear ache, +nasal congestion, post nasal drip,  CV:  +  chest pain, orthopnea, PND, swelling in lower extremities, anasarca, dizziness, palpitations Resp: No-   shortness of breath with exertion or at rest.              No-   productive cough,  No non-productive cough,  No- coughing up of blood.              No-   change in color of mucus.  No- wheezing.   Skin: No-   rash or lesions. GI:  + heartburn,+ indigestion,  No-abdominal pain, nausea, vomiting,  GU: . MS:    +joint pain or swelling.  . Neuro-     nothing unusual Psych:  No- change in mood or affect. No depression, + anxiety.  No memory loss.  OBJ- Physical Exam   + = pos General- Alert, Oriented, Affect-appropriate, Distress- none acute, + overweight. +Small mandible. Skin- rash-none, lesions- none, excoriation- none Lymphadenopathy- none Head- atraumatic            Eyes- Gross vision intact, PERRLA, conjunctivae and secretions clear            Ears- Hearing, canals-normal  Nose- Clear, no-Septal dev, mucus, polyps, erosion, perforation             Throat- Mallampati II , mucosa clear , drainage- none, tonsils- atrophic.                        Own teeth, + retrognathia Neck- flexible , trachea midline, no stridor , thyroid nl, carotid no bruit Chest - symmetrical excursion , unlabored           Heart/CV- RRR , no murmur , no gallop  , no rub, nl s1 s2                            - JVD- none , edema- none, stasis changes- none, varices- none           Lung- clear to P&A/ no crackles heard, wheeze- none, cough- none , dullness-none, rub- none,                   rales-none           Chest wall-  Abd-  Br/ Gen/ Rectal- Not done, not indicated Extrem- cyanosis- none, clubbing, none, atrophy- none, strength- nl Neuro- grossly intact to observation

## 2017-08-22 NOTE — Patient Instructions (Signed)
Continue Eliquis for total of 6 months. We will plan on checking a hypercoagulable profile after you are off Eliquis.  Watch for unexpected events- passing out, palpitation, concerning chest pains or bleeding.  We can continue CPAP auto 7-20, mask of choice, humidifier, supplies   Please call as needed.

## 2017-08-22 NOTE — Assessment & Plan Note (Signed)
Source was probably right calf DVT, resolved by the time of Doppler when hospitalized.  He has done a lot of driving back and forth to the [facility] but no specific provoking circumstance identified.  Now hemodynamically stable and breathing comfortably. Plan-Eliquis was begun January, 2019 and will be continued for 6 months, stopping at the beginning of July.  After that we can send hypercoagulable labs as appropriate.  Education done concerning stasis, observation for bleeding, activity tolerance etc.

## 2017-08-22 NOTE — Assessment & Plan Note (Signed)
No obvious asbestos related changes identified on recent CTa chest or CXR

## 2017-08-23 DIAGNOSIS — H401331 Pigmentary glaucoma, bilateral, mild stage: Secondary | ICD-10-CM | POA: Diagnosis not present

## 2017-08-23 DIAGNOSIS — H26492 Other secondary cataract, left eye: Secondary | ICD-10-CM | POA: Diagnosis not present

## 2017-08-27 ENCOUNTER — Encounter (HOSPITAL_BASED_OUTPATIENT_CLINIC_OR_DEPARTMENT_OTHER): Payer: Self-pay | Admitting: Emergency Medicine

## 2017-08-27 ENCOUNTER — Emergency Department (HOSPITAL_BASED_OUTPATIENT_CLINIC_OR_DEPARTMENT_OTHER)
Admission: EM | Admit: 2017-08-27 | Discharge: 2017-08-27 | Disposition: A | Discharge status: Home / Self Care | Payer: Medicare Other | Attending: Emergency Medicine | Admitting: Emergency Medicine

## 2017-08-27 ENCOUNTER — Other Ambulatory Visit: Payer: Self-pay

## 2017-08-27 DIAGNOSIS — J0101 Acute recurrent maxillary sinusitis: Secondary | ICD-10-CM | POA: Diagnosis not present

## 2017-08-27 DIAGNOSIS — R0989 Other specified symptoms and signs involving the circulatory and respiratory systems: Secondary | ICD-10-CM | POA: Insufficient documentation

## 2017-08-27 DIAGNOSIS — R040 Epistaxis: Secondary | ICD-10-CM | POA: Insufficient documentation

## 2017-08-27 DIAGNOSIS — Z7901 Long term (current) use of anticoagulants: Secondary | ICD-10-CM | POA: Insufficient documentation

## 2017-08-27 DIAGNOSIS — Z79899 Other long term (current) drug therapy: Secondary | ICD-10-CM | POA: Diagnosis not present

## 2017-08-27 DIAGNOSIS — R509 Fever, unspecified: Secondary | ICD-10-CM | POA: Insufficient documentation

## 2017-08-27 DIAGNOSIS — Z87891 Personal history of nicotine dependence: Secondary | ICD-10-CM | POA: Diagnosis not present

## 2017-08-27 DIAGNOSIS — J3489 Other specified disorders of nose and nasal sinuses: Secondary | ICD-10-CM | POA: Diagnosis not present

## 2017-08-27 MED ORDER — OXYMETAZOLINE HCL 0.05 % NA SOLN
1.0000 | Freq: Once | NASAL | Status: AC
  Administered 2017-08-27: 1 via NASAL

## 2017-08-27 MED ORDER — LEVOFLOXACIN 500 MG PO TABS: 500.0000 mg | ORAL_TABLET | Freq: Every day | ORAL | 0 refills | Status: DC

## 2017-08-27 MED ORDER — LEVOFLOXACIN 500 MG PO TABS
500.0000 mg | ORAL_TABLET | Freq: Once | ORAL | Status: AC
  Administered 2017-08-27: 500 mg via ORAL
  Filled 2017-08-27: qty 1

## 2017-08-27 MED ORDER — OXYMETAZOLINE HCL 0.05 % NA SOLN
NASAL | Status: AC
  Administered 2017-08-27: 1 via NASAL
  Filled 2017-08-27: qty 15

## 2017-08-27 NOTE — Discharge Instructions (Signed)
You may use nasal saline to keep your mucous membranes moist. You may use a humidifier.  Other than nasal saline, please do not put anything into your nose for the next 3-4 days. Please do not blow your nose for the next 3-4 days. If your nose begins to bleed again, at this time it is okay to blow your nose and blow out all of the clots and then spray Afrin nasal spray into both nostrils and hold direct pressure for 30 minutes without stopping. If this does not stop the bleeding, please return to the hospital.  

## 2017-08-27 NOTE — ED Provider Notes (Signed)
TIME SEEN: 5:07 AM  CHIEF COMPLAINT: Fever, sinus pressure, nosebleed  HPI: Patient is a 67 year old male with history of obstructive sleep apnea, psoriasis who presents to the emergency department with right-sided epistaxis that occurred several hours prior to arrival.  He is on Eliquis currently for recent diagnosis of bilateral pulmonary emboli.  No injury to the nose or recent procedure or surgery.  Wife does report that he had CT scan showing bilateral sinusitis.  He has been on 6 days of Augmentin, 5 days of azithromycin.  Symptoms were improved but then come back.  Today he has had sinus pressure, green drainage and a fever of 101.  No other symptoms such as cough, sore throat, abdominal pain, vomiting or diarrhea, rash, body aches.  Wife is concerned that he has not been on a long enough course of antibiotics.  He developed hives with Augmentin.  He does have an ENT physician, Dr. Redmond Baseman.  ROS: See HPI Constitutional: fever  Eyes: no drainage  ENT:  runny nose   Cardiovascular:  no chest pain  Resp: no SOB  GI: no vomiting GU: no dysuria Integumentary: no rash  Allergy: no hives  Musculoskeletal: no leg swelling  Neurological: no slurred speech ROS otherwise negative  PAST MEDICAL HISTORY/PAST SURGICAL HISTORY:  Past Medical History:  Diagnosis Date  . Depression   . GERD (gastroesophageal reflux disease)   . Glaucoma   . Headache(784.0)   . Psoriasis   . Seasonal allergies   . Sleep apnea    using a cpap-moderate    MEDICATIONS:  Prior to Admission medications   Medication Sig Start Date End Date Taking? Authorizing Provider  ALPRAZolam Duanne Moron) 0.5 MG tablet Take 0.5 mg by mouth at bedtime as needed for anxiety.   Yes [provider]  atorvastatin (LIPITOR) 20 MG tablet Take 20 mg by mouth daily. 07/13/17  Yes [provider]  ELIQUIS 5 MG TABS tablet Take 1 tablet by mouth 2 (two) times daily.  08/16/17  Yes [provider]  escitalopram  (LEXAPRO) 20 MG tablet Take 20 mg by mouth daily. 02/19/16  Yes [provider]  fluticasone (FLONASE) 50 MCG/ACT nasal spray 1 TO 2 SPRAYS INTO EACH NOSTRIL TWICE A DAY 07/31/17  Yes [provider]  latanoprost (XALATAN) 0.005 % ophthalmic solution 1 drop OU HS 06/21/17  Yes [provider]  tolterodine (DETROL) 2 MG tablet Take 2 mg by mouth daily.   Yes [provider]  loratadine (CLARITIN) 10 MG tablet Take 10 mg by mouth as needed for allergies.    [provider]  Multiple Vitamins-Minerals (PRESERVISION AREDS PO) Take 1 capsule by mouth daily.    [provider]  ranitidine (ZANTAC) 150 MG capsule Take 150 mg by mouth as needed for heartburn.    [provider]    ALLERGIES:  Allergies  Allergen Reactions  . Augmentin [Amoxicillin-Pot Clavulanate]     Severe rash  . Nsaids     Gi upset  . Suvorexant     Other reaction(s): tongue,lip, face swelling  . Tolmetin Nausea Only    Gi upset  . Trazodone     Other reaction(s): prolonged erection    SOCIAL HISTORY:  Social History   Tobacco Use  . Smoking status: Former Smoker    Packs/day: 1.00    Years: 5.00    Pack years: 5.00    Types: Cigarettes    Last attempt to quit: 08/09/1970    Years since quitting: 47.0  .  Smokeless tobacco: Never Used  . Tobacco comment: Very Rare cigar use  Substance Use Topics  . Alcohol use: Yes    Comment: rare    FAMILY HISTORY: No family history on file.  EXAM: BP (!) 141/91 (BP Location: Right Arm)   Pulse 81   Temp 98.4 F (36.9 C) (Oral)   Resp 18   Ht 5\' 7"  (1.702 m)   Wt 85.3 kg (188 lb)   SpO2 95%   BMI 29.44 kg/m  CONSTITUTIONAL: Alert and oriented and responds appropriately to questions. Well-appearing; well-nourished HEAD: Normocephalic EYES: Conjunctivae clear, pupils appear equal, EOMI ENT: normal nose; moist mucous membranes; No pharyngeal erythema or petechiae, no tonsillar hypertrophy or exudate, no  uvular deviation, no unilateral swelling, no trismus or drooling, no muffled voice, normal phonation, no stridor, no dental caries present, no drainable dental abscess noted, no Ludwig's angina, tongue sits flat in the bottom of the mouth, no angioedema, no facial erythema or warmth, no facial swelling; no pain with movement of the neck.  No active bleeding from the right nose but terminates are inflamed and erythematous.  No blood in the posterior oropharynx currently. NECK: Supple, no meningismus, no nuchal rigidity, no LAD  CARD: RRR; S1 and S2 appreciated; no murmurs, no clicks, no rubs, no gallops RESP: Normal chest excursion without splinting or tachypnea; breath sounds clear and equal bilaterally; no wheezes, no rhonchi, no rales, no hypoxia or respiratory distress, speaking full sentences ABD/GI: Normal bowel sounds; non-distended; soft, non-tender, no rebound, no guarding, no peritoneal signs, no hepatosplenomegaly BACK:  The back appears normal and is non-tender to palpation, there is no CVA tenderness EXT: Normal ROM in all joints; non-tender to palpation; no edema; normal capillary refill; no cyanosis, no calf tenderness or swelling    SKIN: Normal color for age and race; warm; no rash NEURO: Moves all extremities equally PSYCH: The patient's mood and manner are appropriate. Grooming and personal hygiene are appropriate.  MEDICAL DECISION MAKING: Right-sided epistaxis.  Before I went into the room nursing staff reports he was pulling large clots out of his mouth and nose.  He has been holding pressure on his nose for several minutes and currently he is not actively bleeding.  We will spray Afrin in both nostrils and have him hold pressure for 30 minutes.  Wife requesting something else for his sinusitis.  Discussed that further antibiotics may not be helpful but will prescribe Levaquin for a 10-day course and have recommended ENT follow-up.  ED PROGRESS: Epistaxis completely resolved.   Patient reports feeling better.  Given first dose of Levaquin here.  They will follow-up with his ENT.  Have advised him to continue Eliquis.  Discharge with Afrin bottle and given supportive care instructions and return precautions.  Patient and wife comfortable with this plan.   At this time, I do not feel there is any life-threatening condition present. I have reviewed and discussed all results (EKG, imaging, lab, urine as appropriate) and exam findings with patient/family. I have reviewed nursing notes and appropriate previous records.  I feel the patient is safe to be discharged home without further emergent workup and can continue workup as an outpatient as needed. Discussed usual and customary return precautions. Patient/family verbalize understanding and are comfortable with this plan.  Outpatient follow-up has been provided if needed. All questions have been answered.    Marland KitchenEpistaxis Management Date/Time: 08/27/2017 5:00 AM Performed by: Wiley Magan, Delice Bison, DO Authorized by: Wenceslao Loper, Delice Bison, DO   Consent:  Consent obtained:  Verbal   Consent given by:  Patient   Risks discussed:  Bleeding, infection, nasal injury and pain   Alternatives discussed:  Alternative treatment Anesthesia (see MAR for exact dosages):    Anesthesia method:  None Procedure details:    Treatment site:  Unable to specify   Repair method: Afrin and direct pressure.   Treatment complexity:  Limited   Treatment episode: initial   Post-procedure details:    Assessment:  Bleeding stopped   Patient tolerance of procedure:  Tolerated well, no immediate complications         Geral Coker, Delice Bison, DO 08/27/17 [phone]

## 2017-08-27 NOTE — ED Notes (Signed)
ED Provider at bedside. 

## 2017-08-27 NOTE — ED Triage Notes (Signed)
Pt reports bleeding from right nare that started earlier tonight. Pt is on eliquis for multiple PEs. Pt has also been on abx for sinus infection that was dx on CT.

## 2017-08-30 DIAGNOSIS — H26492 Other secondary cataract, left eye: Secondary | ICD-10-CM | POA: Diagnosis not present

## 2017-09-13 DIAGNOSIS — Z86711 Personal history of pulmonary embolism: Secondary | ICD-10-CM | POA: Diagnosis not present

## 2017-09-13 DIAGNOSIS — I2699 Other pulmonary embolism without acute cor pulmonale: Secondary | ICD-10-CM | POA: Diagnosis not present

## 2017-09-14 ENCOUNTER — Ambulatory Visit: Payer: Medicare Other | Admitting: Internal Medicine

## 2017-09-20 DIAGNOSIS — H401331 Pigmentary glaucoma, bilateral, mild stage: Secondary | ICD-10-CM | POA: Diagnosis not present

## 2017-10-03 DIAGNOSIS — Z86711 Personal history of pulmonary embolism: Secondary | ICD-10-CM | POA: Diagnosis not present

## 2017-10-03 DIAGNOSIS — I2699 Other pulmonary embolism without acute cor pulmonale: Secondary | ICD-10-CM | POA: Diagnosis not present

## 2017-10-04 DIAGNOSIS — M6281 Muscle weakness (generalized): Secondary | ICD-10-CM | POA: Diagnosis not present

## 2017-10-04 DIAGNOSIS — M25559 Pain in unspecified hip: Secondary | ICD-10-CM | POA: Diagnosis not present

## 2017-10-13 DIAGNOSIS — M25559 Pain in unspecified hip: Secondary | ICD-10-CM | POA: Diagnosis not present

## 2017-11-02 DIAGNOSIS — M545 Low back pain: Secondary | ICD-10-CM | POA: Diagnosis not present

## 2017-11-15 DIAGNOSIS — M25562 Pain in left knee: Secondary | ICD-10-CM | POA: Diagnosis not present

## 2017-11-15 DIAGNOSIS — M25462 Effusion, left knee: Secondary | ICD-10-CM | POA: Diagnosis not present

## 2017-11-20 ENCOUNTER — Encounter: Payer: Self-pay | Admitting: Internal Medicine

## 2017-11-21 ENCOUNTER — Encounter: Payer: Self-pay | Admitting: Internal Medicine

## 2017-11-21 ENCOUNTER — Ambulatory Visit (INDEPENDENT_AMBULATORY_CARE_PROVIDER_SITE_OTHER): Payer: Medicare Other | Admitting: Internal Medicine

## 2017-11-21 VITALS — BP 126/78 | HR 63 | Ht 67.0 in | Wt 191.0 lb

## 2017-11-21 DIAGNOSIS — I2602 Saddle embolus of pulmonary artery with acute cor pulmonale: Secondary | ICD-10-CM

## 2017-11-21 DIAGNOSIS — G4733 Obstructive sleep apnea (adult) (pediatric): Secondary | ICD-10-CM | POA: Diagnosis not present

## 2017-11-21 NOTE — Progress Notes (Signed)
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  Unprovoked PE.  Right calf pain 2 weeks before Christmas.  Orthostatic dizziness and substernal pleuritic chest pain.CTa 08/15/17 showed stable multiple pulmonary emboli.  CT of head, abdomen, pelvis all negative for neoplasm.  Doppler ultrasound of legs reportedly negative by that time.  Echocardiogram reported EF 55-60% without RV strain.  He drives back and forth between [place] and [place].  Had gel injections into L knee for arthritis in November.  Breathing now feels normal with minimal residual substernal discomfort on forced a deep breath only.  No bleeding or headache.  He has established for primary care at [facility] [phone] in [place].  No previous clotting per history.  Mother had PE after MVA with multiple trauma. Additional problem-urticaria times 2 years, intermittent, triggered by pressure.  History of rash/hives after Augmentin. OSA-CPAP doing very well.  Definitely sleeps better with it and wife confirms.  Download compliance 91%, AHI 1.3/hour. CXR 05/18/17 IMPRESSION: Stable and negative.  No acute cardiopulmonary abnormality.  11/21/17- 67 year old male former smoker followed for OSA, Asbestos exposure, PE/ Eliquis , Urticaria, complicated by seasonal allergy, Glaucoma, GERD CPAP auto 7-20/APS  ----OSA; DME: [facility]-W-S location. Pt is wearing  CPAP most of the time. DL attached and will need order for new supplies Doing well.  No more urticaria.  Continues Eliquis at least until June-management by PCP at the [facility]. No acute events. He and his wife both say he sleeps better and does not snore using CPAP.  Download 97% AHI 1.6/hour  ROS-see HPI + = positive Constitutional:   No-   weight loss, night sweats, fevers, chills, fatigue, lassitude. HEENT:   +  headaches, +difficulty swallowing, tooth/dental problems, sore throat,       No-  sneezing, itching, ear ache, +nasal congestion, post nasal drip,  CV:  +  chest pain, orthopnea, PND, swelling in lower extremities, anasarca, dizziness, palpitations Resp: No-   shortness of breath with exertion or at rest.              No-   productive cough,  No non-productive cough,  No- coughing up of blood.              No-   change in color of mucus.  No- wheezing.   Skin: No-   rash or lesions. GI:  + heartburn,+ indigestion,  No-abdominal pain, nausea, vomiting,  GU: . MS:    +joint pain or swelling.  . Neuro-     nothing unusual Psych:  No- change in mood or affect. No depression, + anxiety.  No memory loss.  OBJ- Physical Exam   + = pos General- Alert, Oriented, Affect-appropriate, Distress- none acute, + overweight. +Small mandible. Skin- rash-none, lesions- none, excoriation- none Lymphadenopathy- none Head- atraumatic            Eyes- Gross vision intact, PERRLA, conjunctivae and secretions clear            Ears- Hearing, canals-normal  Nose- Clear, no-Septal dev, mucus, polyps, erosion, perforation             Throat- Mallampati II , mucosa clear , drainage- none, tonsils- atrophic.                        Own teeth, + retrognathia Neck- flexible , trachea midline, no stridor , thyroid nl, carotid no bruit Chest - symmetrical excursion , unlabored           Heart/CV- RRR , no murmur , no gallop  , no rub, nl s1 s2                           - JVD- none , edema- none,  stasis changes- none, varices- none           Lung- clear to P&A/ no crackles heard, wheeze- none, cough- none , dullness-none, rub- none,                   rales-none           Chest wall-  Abd-  Br/ Gen/ Rectal- Not done, not indicated Extrem- cyanosis- none, clubbing, none, atrophy- none, strength- nl Neuro- grossly intact to observation

## 2017-11-21 NOTE — Patient Instructions (Signed)
Order- DME [facility] please replace mask of choice, and supplies, continue CPAP auto 7-20, humidifier, AirView or DL card  Please call if we can help

## 2017-12-04 NOTE — Assessment & Plan Note (Signed)
Benefits from CPAP with better sleep. Plan-continue CPAP auto 7-20

## 2017-12-04 NOTE — Assessment & Plan Note (Signed)
62-month course of Eliquis would end in June, at the discretion of his managing physician at the [facility].

## 2017-12-07 ENCOUNTER — Telehealth: Payer: Self-pay | Admitting: Internal Medicine

## 2017-12-07 DIAGNOSIS — M2392 Unspecified internal derangement of left knee: Secondary | ICD-10-CM | POA: Diagnosis not present

## 2017-12-07 DIAGNOSIS — M25462 Effusion, left knee: Secondary | ICD-10-CM | POA: Diagnosis not present

## 2017-12-07 NOTE — Telephone Encounter (Signed)
Pt's wife states that pt is having an I&D on Monday on his knee- is stopping Eliquis X3 days prior, is needing a letter of sx clearance from [place] to be sent to Dr. Elgie Collard at [phone].    [place] please advise if ok to type sx clearance letter.  Thanks.

## 2017-12-08 ENCOUNTER — Telehealth: Payer: Self-pay | Admitting: Internal Medicine

## 2017-12-08 DIAGNOSIS — Z01812 Encounter for preprocedural laboratory examination: Secondary | ICD-10-CM | POA: Diagnosis not present

## 2017-12-08 DIAGNOSIS — Z01810 Encounter for preprocedural cardiovascular examination: Secondary | ICD-10-CM | POA: Diagnosis not present

## 2017-12-08 NOTE — Telephone Encounter (Signed)
Refaxed letter. Will keep this encounter open until I receive a confirmation.

## 2017-12-08 NOTE — Telephone Encounter (Signed)
Bri with Dr. Marliss Coots office returned call.  She states that we do have the correct fax #, [phone].  She states the fax is working and asks [place] to try to re-fax this again.

## 2017-12-08 NOTE — Telephone Encounter (Signed)
Spoke with patient's wife. She is aware of [place]'s recs. Will go ahead and send clearance letter.   Nothing else needed at time of call.

## 2017-12-08 NOTE — Telephone Encounter (Signed)
Called and spoke to patient's wife and verified fax number of [phone]. Attempted to send fax, but with multiple attempts it was unable to go through. Called Dr. Marliss Coots office to verify fax number, left voicemail to call back with fax number. Called the patient's wife who reported that she will follow up with Dr. Marliss Coots office and get back with [place].

## 2017-12-08 NOTE — Telephone Encounter (Signed)
Yes thanks 

## 2017-12-09 ENCOUNTER — Telehealth: Payer: Self-pay | Admitting: Internal Medicine

## 2017-12-09 DIAGNOSIS — I2699 Other pulmonary embolism without acute cor pulmonale: Secondary | ICD-10-CM | POA: Diagnosis not present

## 2017-12-09 DIAGNOSIS — M25562 Pain in left knee: Secondary | ICD-10-CM | POA: Diagnosis not present

## 2017-12-09 NOTE — Telephone Encounter (Signed)
Received release, last OV note faxed to number listed. Nothing further is needed.

## 2017-12-09 NOTE — Telephone Encounter (Signed)
Anthony [name] received confirmation. Will close encounter.

## 2017-12-12 DIAGNOSIS — M65862 Other synovitis and tenosynovitis, left lower leg: Secondary | ICD-10-CM | POA: Diagnosis not present

## 2017-12-12 DIAGNOSIS — M238X2 Other internal derangements of left knee: Secondary | ICD-10-CM | POA: Diagnosis not present

## 2017-12-12 DIAGNOSIS — M94262 Chondromalacia, left knee: Secondary | ICD-10-CM | POA: Diagnosis not present

## 2017-12-12 DIAGNOSIS — G629 Polyneuropathy, unspecified: Secondary | ICD-10-CM | POA: Diagnosis not present

## 2017-12-12 DIAGNOSIS — M2392 Unspecified internal derangement of left knee: Secondary | ICD-10-CM | POA: Diagnosis not present

## 2017-12-12 DIAGNOSIS — F329 Major depressive disorder, single episode, unspecified: Secondary | ICD-10-CM | POA: Diagnosis not present

## 2017-12-12 DIAGNOSIS — M25462 Effusion, left knee: Secondary | ICD-10-CM | POA: Diagnosis not present

## 2017-12-12 DIAGNOSIS — M2342 Loose body in knee, left knee: Secondary | ICD-10-CM | POA: Diagnosis not present

## 2017-12-12 DIAGNOSIS — M659 Synovitis and tenosynovitis, unspecified: Secondary | ICD-10-CM | POA: Diagnosis not present

## 2017-12-12 DIAGNOSIS — M67862 Other specified disorders of synovium, left knee: Secondary | ICD-10-CM | POA: Diagnosis not present

## 2017-12-16 DIAGNOSIS — M25562 Pain in left knee: Secondary | ICD-10-CM | POA: Diagnosis not present

## 2017-12-20 DIAGNOSIS — M25562 Pain in left knee: Secondary | ICD-10-CM | POA: Diagnosis not present

## 2017-12-21 DIAGNOSIS — M25562 Pain in left knee: Secondary | ICD-10-CM | POA: Diagnosis not present

## 2017-12-23 DIAGNOSIS — M25562 Pain in left knee: Secondary | ICD-10-CM | POA: Diagnosis not present

## 2017-12-26 DIAGNOSIS — M25562 Pain in left knee: Secondary | ICD-10-CM | POA: Diagnosis not present

## 2018-01-03 DIAGNOSIS — Z86711 Personal history of pulmonary embolism: Secondary | ICD-10-CM | POA: Diagnosis not present

## 2018-01-03 DIAGNOSIS — I2699 Other pulmonary embolism without acute cor pulmonale: Secondary | ICD-10-CM | POA: Diagnosis not present

## 2018-01-05 DIAGNOSIS — I2692 Saddle embolus of pulmonary artery without acute cor pulmonale: Secondary | ICD-10-CM | POA: Diagnosis not present

## 2018-01-05 DIAGNOSIS — G4700 Insomnia, unspecified: Secondary | ICD-10-CM | POA: Diagnosis not present

## 2018-01-05 DIAGNOSIS — F419 Anxiety disorder, unspecified: Secondary | ICD-10-CM | POA: Diagnosis not present

## 2018-01-05 DIAGNOSIS — B009 Herpesviral infection, unspecified: Secondary | ICD-10-CM | POA: Diagnosis not present

## 2018-01-11 DIAGNOSIS — Z808 Family history of malignant neoplasm of other organs or systems: Secondary | ICD-10-CM | POA: Diagnosis not present

## 2018-01-11 DIAGNOSIS — L219 Seborrheic dermatitis, unspecified: Secondary | ICD-10-CM | POA: Diagnosis not present

## 2018-01-11 DIAGNOSIS — L821 Other seborrheic keratosis: Secondary | ICD-10-CM | POA: Diagnosis not present

## 2018-01-11 DIAGNOSIS — D225 Melanocytic nevi of trunk: Secondary | ICD-10-CM | POA: Diagnosis not present

## 2018-01-11 DIAGNOSIS — H26492 Other secondary cataract, left eye: Secondary | ICD-10-CM | POA: Diagnosis not present

## 2018-01-11 DIAGNOSIS — D1800 Hemangioma unspecified site: Secondary | ICD-10-CM | POA: Diagnosis not present

## 2018-01-11 DIAGNOSIS — L814 Other melanin hyperpigmentation: Secondary | ICD-10-CM | POA: Diagnosis not present

## 2018-01-11 DIAGNOSIS — L820 Inflamed seborrheic keratosis: Secondary | ICD-10-CM | POA: Diagnosis not present

## 2018-01-11 DIAGNOSIS — H401331 Pigmentary glaucoma, bilateral, mild stage: Secondary | ICD-10-CM | POA: Diagnosis not present

## 2018-01-13 DIAGNOSIS — M25562 Pain in left knee: Secondary | ICD-10-CM | POA: Diagnosis not present

## 2018-01-16 DIAGNOSIS — M25562 Pain in left knee: Secondary | ICD-10-CM | POA: Diagnosis not present

## 2018-01-30 DIAGNOSIS — M2550 Pain in unspecified joint: Secondary | ICD-10-CM | POA: Diagnosis not present

## 2018-01-30 DIAGNOSIS — R52 Pain, unspecified: Secondary | ICD-10-CM | POA: Diagnosis not present

## 2018-02-15 DIAGNOSIS — M129 Arthropathy, unspecified: Secondary | ICD-10-CM | POA: Diagnosis not present

## 2018-02-15 DIAGNOSIS — M779 Enthesopathy, unspecified: Secondary | ICD-10-CM | POA: Diagnosis not present

## 2018-02-28 DIAGNOSIS — Z23 Encounter for immunization: Secondary | ICD-10-CM | POA: Diagnosis not present

## 2018-02-28 DIAGNOSIS — S0181XA Laceration without foreign body of other part of head, initial encounter: Secondary | ICD-10-CM | POA: Diagnosis not present

## 2018-03-28 DIAGNOSIS — Z4789 Encounter for other orthopedic aftercare: Secondary | ICD-10-CM | POA: Diagnosis not present

## 2018-05-10 DIAGNOSIS — H401331 Pigmentary glaucoma, bilateral, mild stage: Secondary | ICD-10-CM | POA: Diagnosis not present

## 2018-06-08 DIAGNOSIS — M2550 Pain in unspecified joint: Secondary | ICD-10-CM | POA: Diagnosis not present

## 2018-06-08 DIAGNOSIS — R7303 Prediabetes: Secondary | ICD-10-CM | POA: Diagnosis not present

## 2018-06-08 DIAGNOSIS — I2699 Other pulmonary embolism without acute cor pulmonale: Secondary | ICD-10-CM | POA: Diagnosis not present

## 2018-06-08 DIAGNOSIS — D8989 Other specified disorders involving the immune mechanism, not elsewhere classified: Secondary | ICD-10-CM | POA: Diagnosis not present

## 2018-06-08 DIAGNOSIS — M130 Polyarthritis, unspecified: Secondary | ICD-10-CM | POA: Diagnosis not present

## 2018-06-08 DIAGNOSIS — Z23 Encounter for immunization: Secondary | ICD-10-CM | POA: Diagnosis not present

## 2018-07-13 DIAGNOSIS — Z0001 Encounter for general adult medical examination with abnormal findings: Secondary | ICD-10-CM | POA: Diagnosis not present

## 2018-07-13 DIAGNOSIS — F419 Anxiety disorder, unspecified: Secondary | ICD-10-CM | POA: Diagnosis not present

## 2018-07-13 DIAGNOSIS — Z86711 Personal history of pulmonary embolism: Secondary | ICD-10-CM | POA: Diagnosis not present

## 2018-07-13 DIAGNOSIS — G4700 Insomnia, unspecified: Secondary | ICD-10-CM | POA: Diagnosis not present

## 2018-07-13 DIAGNOSIS — Z125 Encounter for screening for malignant neoplasm of prostate: Secondary | ICD-10-CM | POA: Diagnosis not present

## 2018-07-13 DIAGNOSIS — Z79899 Other long term (current) drug therapy: Secondary | ICD-10-CM | POA: Diagnosis not present

## 2018-07-13 DIAGNOSIS — Z23 Encounter for immunization: Secondary | ICD-10-CM | POA: Diagnosis not present

## 2018-07-13 DIAGNOSIS — E7800 Pure hypercholesterolemia, unspecified: Secondary | ICD-10-CM | POA: Diagnosis not present

## 2018-07-13 DIAGNOSIS — K219 Gastro-esophageal reflux disease without esophagitis: Secondary | ICD-10-CM | POA: Diagnosis not present

## 2018-07-27 DIAGNOSIS — R7303 Prediabetes: Secondary | ICD-10-CM | POA: Diagnosis not present

## 2018-08-03 DIAGNOSIS — J069 Acute upper respiratory infection, unspecified: Secondary | ICD-10-CM | POA: Diagnosis not present

## 2018-08-08 DIAGNOSIS — J029 Acute pharyngitis, unspecified: Secondary | ICD-10-CM | POA: Diagnosis not present

## 2018-08-08 DIAGNOSIS — B370 Candidal stomatitis: Secondary | ICD-10-CM | POA: Diagnosis not present

## 2018-08-10 DIAGNOSIS — H401331 Pigmentary glaucoma, bilateral, mild stage: Secondary | ICD-10-CM | POA: Diagnosis not present

## 2018-08-15 DIAGNOSIS — R7303 Prediabetes: Secondary | ICD-10-CM | POA: Diagnosis not present

## 2018-08-15 DIAGNOSIS — Z7952 Long term (current) use of systemic steroids: Secondary | ICD-10-CM | POA: Diagnosis not present

## 2018-08-15 DIAGNOSIS — M130 Polyarthritis, unspecified: Secondary | ICD-10-CM | POA: Diagnosis not present

## 2018-08-15 DIAGNOSIS — I2699 Other pulmonary embolism without acute cor pulmonale: Secondary | ICD-10-CM | POA: Diagnosis not present

## 2018-08-21 DIAGNOSIS — K141 Geographic tongue: Secondary | ICD-10-CM | POA: Insufficient documentation

## 2018-09-04 ENCOUNTER — Other Ambulatory Visit: Payer: Self-pay | Admitting: Gastroenterology

## 2018-09-04 DIAGNOSIS — R1310 Dysphagia, unspecified: Secondary | ICD-10-CM

## 2018-09-04 DIAGNOSIS — K5900 Constipation, unspecified: Secondary | ICD-10-CM | POA: Diagnosis not present

## 2018-09-04 DIAGNOSIS — K219 Gastro-esophageal reflux disease without esophagitis: Secondary | ICD-10-CM | POA: Diagnosis not present

## 2018-09-04 DIAGNOSIS — R194 Change in bowel habit: Secondary | ICD-10-CM | POA: Diagnosis not present

## 2018-09-05 ENCOUNTER — Ambulatory Visit
Admission: RE | Admit: 2018-09-05 | Discharge: 2018-09-05 | Disposition: A | Discharge status: Home / Self Care | Payer: Medicare Other | Source: Ambulatory Visit | Attending: Gastroenterology | Admitting: Gastroenterology

## 2018-09-05 DIAGNOSIS — R1310 Dysphagia, unspecified: Secondary | ICD-10-CM

## 2018-09-05 DIAGNOSIS — K449 Diaphragmatic hernia without obstruction or gangrene: Secondary | ICD-10-CM | POA: Diagnosis not present

## 2018-09-08 DIAGNOSIS — N400 Enlarged prostate without lower urinary tract symptoms: Secondary | ICD-10-CM | POA: Diagnosis not present

## 2018-09-08 DIAGNOSIS — N3281 Overactive bladder: Secondary | ICD-10-CM | POA: Diagnosis not present

## 2018-09-19 DIAGNOSIS — K228 Other specified diseases of esophagus: Secondary | ICD-10-CM | POA: Diagnosis not present

## 2018-09-19 DIAGNOSIS — D123 Benign neoplasm of transverse colon: Secondary | ICD-10-CM | POA: Diagnosis not present

## 2018-09-19 DIAGNOSIS — K210 Gastro-esophageal reflux disease with esophagitis: Secondary | ICD-10-CM | POA: Diagnosis not present

## 2018-09-19 DIAGNOSIS — R194 Change in bowel habit: Secondary | ICD-10-CM | POA: Diagnosis not present

## 2018-09-19 DIAGNOSIS — K640 First degree hemorrhoids: Secondary | ICD-10-CM | POA: Diagnosis not present

## 2018-09-19 DIAGNOSIS — R1310 Dysphagia, unspecified: Secondary | ICD-10-CM | POA: Diagnosis not present

## 2018-09-20 ENCOUNTER — Other Ambulatory Visit: Payer: Self-pay | Admitting: Gastroenterology

## 2018-09-20 DIAGNOSIS — R1013 Epigastric pain: Secondary | ICD-10-CM

## 2018-09-22 DIAGNOSIS — K210 Gastro-esophageal reflux disease with esophagitis: Secondary | ICD-10-CM | POA: Diagnosis not present

## 2018-09-22 DIAGNOSIS — D123 Benign neoplasm of transverse colon: Secondary | ICD-10-CM | POA: Diagnosis not present

## 2018-09-26 DIAGNOSIS — N3281 Overactive bladder: Secondary | ICD-10-CM | POA: Diagnosis not present

## 2018-09-29 ENCOUNTER — Ambulatory Visit
Admission: RE | Admit: 2018-09-29 | Discharge: 2018-09-29 | Disposition: A | Discharge status: Home / Self Care | Payer: Medicare Other | Source: Ambulatory Visit | Attending: Gastroenterology | Admitting: Gastroenterology

## 2018-09-29 DIAGNOSIS — K2270 Barrett's esophagus without dysplasia: Secondary | ICD-10-CM | POA: Diagnosis not present

## 2018-09-29 DIAGNOSIS — R109 Unspecified abdominal pain: Secondary | ICD-10-CM | POA: Diagnosis not present

## 2018-09-29 DIAGNOSIS — R1013 Epigastric pain: Secondary | ICD-10-CM

## 2018-09-29 MED ORDER — IOPAMIDOL (ISOVUE-300) INJECTION 61%
100.0000 mL | Freq: Once | INTRAVENOUS | Status: AC | PRN
  Administered 2018-09-29: 100 mL via INTRAVENOUS

## 2018-10-03 DIAGNOSIS — N3281 Overactive bladder: Secondary | ICD-10-CM | POA: Diagnosis not present

## 2018-10-06 ENCOUNTER — Other Ambulatory Visit: Payer: Self-pay | Admitting: Gastroenterology

## 2018-10-11 DIAGNOSIS — H401331 Pigmentary glaucoma, bilateral, mild stage: Secondary | ICD-10-CM | POA: Diagnosis not present

## 2018-10-12 DIAGNOSIS — N3281 Overactive bladder: Secondary | ICD-10-CM | POA: Diagnosis not present

## 2018-10-18 DIAGNOSIS — R430 Anosmia: Secondary | ICD-10-CM | POA: Diagnosis not present

## 2018-10-18 DIAGNOSIS — R682 Dry mouth, unspecified: Secondary | ICD-10-CM | POA: Diagnosis not present

## 2018-10-19 DIAGNOSIS — N3281 Overactive bladder: Secondary | ICD-10-CM | POA: Diagnosis not present

## 2018-10-25 ENCOUNTER — Encounter (HOSPITAL_COMMUNITY)
Admission: RE | Disposition: A | Discharge status: Home / Self Care | Payer: Self-pay | Source: Home / Self Care | Attending: Gastroenterology

## 2018-10-25 ENCOUNTER — Ambulatory Visit (HOSPITAL_COMMUNITY)
Admission: RE | Admit: 2018-10-25 | Discharge: 2018-10-25 | Disposition: A | Discharge status: Home / Self Care | Payer: Medicare Other | Attending: Gastroenterology | Admitting: Gastroenterology

## 2018-10-25 DIAGNOSIS — R1310 Dysphagia, unspecified: Secondary | ICD-10-CM | POA: Diagnosis not present

## 2018-10-25 DIAGNOSIS — R05 Cough: Secondary | ICD-10-CM | POA: Diagnosis not present

## 2018-10-25 HISTORY — PX: ESOPHAGEAL MANOMETRY: SHX5429

## 2018-10-25 HISTORY — PX: PH IMPEDANCE STUDY: SHX5565

## 2018-10-25 SURGERY — MANOMETRY, ESOPHAGUS

## 2018-10-25 MED ORDER — LIDOCAINE VISCOUS HCL 2 % MT SOLN
OROMUCOSAL | Status: AC
  Filled 2018-10-25: qty 15

## 2018-10-25 SURGICAL SUPPLY — 2 items
FACESHIELD LNG OPTICON STERILE (SAFETY) IMPLANT
GLOVE BIO SURGEON STRL SZ8 (GLOVE) ×4 IMPLANT

## 2018-10-25 NOTE — Progress Notes (Signed)
Esophageal manometry performed per protocol.  Patient tolerated procedure without complications.  PH probe placed per protocol at 39cm.  Patient tolerated procedure without complications.  Patient given instructions and asked to return tomorrow around 9:13.  Dr. Silverio Decamp to interpret results.

## 2018-10-26 DIAGNOSIS — N3281 Overactive bladder: Secondary | ICD-10-CM | POA: Diagnosis not present

## 2018-10-27 ENCOUNTER — Encounter (HOSPITAL_COMMUNITY): Payer: Self-pay | Admitting: Gastroenterology

## 2018-11-02 DIAGNOSIS — K219 Gastro-esophageal reflux disease without esophagitis: Secondary | ICD-10-CM | POA: Diagnosis not present

## 2018-11-02 DIAGNOSIS — N3281 Overactive bladder: Secondary | ICD-10-CM | POA: Diagnosis not present

## 2018-11-02 DIAGNOSIS — R1310 Dysphagia, unspecified: Secondary | ICD-10-CM | POA: Diagnosis not present

## 2018-11-08 ENCOUNTER — Other Ambulatory Visit: Payer: Self-pay | Admitting: Otolaryngology

## 2018-11-08 DIAGNOSIS — R430 Anosmia: Secondary | ICD-10-CM

## 2018-11-09 DIAGNOSIS — H401331 Pigmentary glaucoma, bilateral, mild stage: Secondary | ICD-10-CM | POA: Diagnosis not present

## 2018-11-09 DIAGNOSIS — N3281 Overactive bladder: Secondary | ICD-10-CM | POA: Diagnosis not present

## 2018-11-10 ENCOUNTER — Other Ambulatory Visit: Payer: Self-pay

## 2018-11-10 ENCOUNTER — Ambulatory Visit
Admission: RE | Admit: 2018-11-10 | Discharge: 2018-11-10 | Disposition: A | Discharge status: Home / Self Care | Payer: Medicare Other | Source: Ambulatory Visit | Attending: Otolaryngology | Admitting: Otolaryngology

## 2018-11-10 DIAGNOSIS — R430 Anosmia: Secondary | ICD-10-CM | POA: Diagnosis not present

## 2018-11-10 MED ORDER — GADOBENATE DIMEGLUMINE 529 MG/ML IV SOLN
17.0000 mL | Freq: Once | INTRAVENOUS | Status: AC | PRN
  Administered 2018-11-10: 17 mL via INTRAVENOUS

## 2018-11-16 DIAGNOSIS — N3281 Overactive bladder: Secondary | ICD-10-CM | POA: Diagnosis not present

## 2018-11-20 DIAGNOSIS — R682 Dry mouth, unspecified: Secondary | ICD-10-CM | POA: Diagnosis not present

## 2018-11-20 DIAGNOSIS — R438 Other disturbances of smell and taste: Secondary | ICD-10-CM | POA: Diagnosis not present

## 2018-11-20 DIAGNOSIS — H9313 Tinnitus, bilateral: Secondary | ICD-10-CM | POA: Diagnosis not present

## 2018-11-20 DIAGNOSIS — Z87891 Personal history of nicotine dependence: Secondary | ICD-10-CM | POA: Diagnosis not present

## 2018-11-20 DIAGNOSIS — Z7289 Other problems related to lifestyle: Secondary | ICD-10-CM | POA: Diagnosis not present

## 2018-11-21 DIAGNOSIS — R7303 Prediabetes: Secondary | ICD-10-CM | POA: Diagnosis not present

## 2018-11-21 DIAGNOSIS — M130 Polyarthritis, unspecified: Secondary | ICD-10-CM | POA: Diagnosis not present

## 2018-11-21 DIAGNOSIS — Z7952 Long term (current) use of systemic steroids: Secondary | ICD-10-CM | POA: Diagnosis not present

## 2018-11-21 DIAGNOSIS — I2699 Other pulmonary embolism without acute cor pulmonale: Secondary | ICD-10-CM | POA: Diagnosis not present

## 2018-11-22 ENCOUNTER — Ambulatory Visit: Payer: Medicare Other | Admitting: Internal Medicine

## 2018-11-23 DIAGNOSIS — N3281 Overactive bladder: Secondary | ICD-10-CM | POA: Diagnosis not present

## 2018-11-30 DIAGNOSIS — N3281 Overactive bladder: Secondary | ICD-10-CM | POA: Diagnosis not present

## 2018-12-05 DIAGNOSIS — N3281 Overactive bladder: Secondary | ICD-10-CM | POA: Diagnosis not present

## 2019-01-10 DIAGNOSIS — N3281 Overactive bladder: Secondary | ICD-10-CM | POA: Diagnosis not present

## 2019-01-10 DIAGNOSIS — R3915 Urgency of urination: Secondary | ICD-10-CM | POA: Diagnosis not present

## 2019-01-10 DIAGNOSIS — N400 Enlarged prostate without lower urinary tract symptoms: Secondary | ICD-10-CM | POA: Diagnosis not present

## 2019-01-12 DIAGNOSIS — I10 Essential (primary) hypertension: Secondary | ICD-10-CM | POA: Diagnosis not present

## 2019-01-12 DIAGNOSIS — F419 Anxiety disorder, unspecified: Secondary | ICD-10-CM | POA: Diagnosis not present

## 2019-01-12 DIAGNOSIS — G4700 Insomnia, unspecified: Secondary | ICD-10-CM | POA: Diagnosis not present

## 2019-01-30 DIAGNOSIS — K110 Atrophy of salivary gland: Secondary | ICD-10-CM | POA: Diagnosis not present

## 2019-01-30 DIAGNOSIS — Z87891 Personal history of nicotine dependence: Secondary | ICD-10-CM | POA: Diagnosis not present

## 2019-01-30 DIAGNOSIS — R682 Dry mouth, unspecified: Secondary | ICD-10-CM | POA: Diagnosis not present

## 2019-01-30 DIAGNOSIS — R430 Anosmia: Secondary | ICD-10-CM | POA: Diagnosis not present

## 2019-01-30 DIAGNOSIS — D110 Benign neoplasm of parotid gland: Secondary | ICD-10-CM | POA: Diagnosis not present

## 2019-01-30 DIAGNOSIS — H9313 Tinnitus, bilateral: Secondary | ICD-10-CM | POA: Diagnosis not present

## 2019-01-31 DIAGNOSIS — R3915 Urgency of urination: Secondary | ICD-10-CM | POA: Diagnosis not present

## 2019-02-07 DIAGNOSIS — D225 Melanocytic nevi of trunk: Secondary | ICD-10-CM | POA: Diagnosis not present

## 2019-02-07 DIAGNOSIS — W57XXXA Bitten or stung by nonvenomous insect and other nonvenomous arthropods, initial encounter: Secondary | ICD-10-CM | POA: Diagnosis not present

## 2019-02-07 DIAGNOSIS — L821 Other seborrheic keratosis: Secondary | ICD-10-CM | POA: Diagnosis not present

## 2019-02-07 DIAGNOSIS — L219 Seborrheic dermatitis, unspecified: Secondary | ICD-10-CM | POA: Diagnosis not present

## 2019-02-07 DIAGNOSIS — L814 Other melanin hyperpigmentation: Secondary | ICD-10-CM | POA: Diagnosis not present

## 2019-02-07 DIAGNOSIS — Z808 Family history of malignant neoplasm of other organs or systems: Secondary | ICD-10-CM | POA: Diagnosis not present

## 2019-02-07 DIAGNOSIS — S80861A Insect bite (nonvenomous), right lower leg, initial encounter: Secondary | ICD-10-CM | POA: Diagnosis not present

## 2019-02-14 ENCOUNTER — Encounter: Payer: Self-pay | Admitting: Internal Medicine

## 2019-02-14 ENCOUNTER — Other Ambulatory Visit: Payer: Self-pay

## 2019-02-14 ENCOUNTER — Ambulatory Visit (INDEPENDENT_AMBULATORY_CARE_PROVIDER_SITE_OTHER): Payer: Medicare Other | Admitting: Internal Medicine

## 2019-02-14 DIAGNOSIS — G4733 Obstructive sleep apnea (adult) (pediatric): Secondary | ICD-10-CM

## 2019-02-14 DIAGNOSIS — Z77090 Contact with and (suspected) exposure to asbestos: Secondary | ICD-10-CM | POA: Diagnosis not present

## 2019-02-14 NOTE — Progress Notes (Signed)
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 Pt is wearing CPAP most of the time. DL attached and will need order for new supplies Doing well.  No more urticaria.  Continues Eliquis at least until June-management by PCP at the [facility]. No acute events. He and his wife both say he sleeps better and does not snore using CPAP.  Download 97% AHI 1.6/hour   02/14/2019- Virtual Visit via Telephone Note  I connected with Ander Purpura Shenoy on 02/14/19 at 11:00 AM EDT by telephone and verified that I am speaking with the correct person using two identifiers.  Location: Patient: H Provider: O   I discussed the limitations, risks, security and privacy concerns of performing an evaluation and management service by telephone and the availability of in person appointments. I also discussed with the patient that there may be a patient responsible charge related to this service. The patient expressed understanding and agreed to proceed.   History of Present Illness: 68 year old male former smoker followed for OSA, Asbestos exposure, PE/ Eliquis 2019 , Urticaria, complicated by seasonal allergy, Glaucoma, GERD, bradycardia CPAP auto 7-20/APS >> today change to auto 10-20No download today -----OSA on CPAP auto 7-20; DME: [facility]; states he feels the pressure settings need to be increased  Feels sometimes not enough airflow. PCP concerned about BP. Discussed  relationship of BP to OSA.   Observations/Objective: No download this televist  Assessment and Plan: OSA- Compliance good and sleeping ok. - Plan: change auto range to 10-20, request download  Follow Up Instructions: 1 year   I discussed the assessment and treatment plan with the patient. The patient was provided an opportunity to ask questions and all were answered. The patient agreed with the plan and demonstrated an understanding of the instructions.   The patient was advised to call back or seek an in-person evaluation if the symptoms worsen or if the condition fails to improve as anticipated.  I provided 18 minutes of non-face-to-face time during this encounter.   Baird Lyons, MD  ROS-see HPI + = positive Constitutional:   No-   weight loss, night sweats, fevers, chills, fatigue, lassitude. HEENT:   +  headaches, +difficulty swallowing, tooth/dental problems, sore throat,       No-  sneezing, itching, ear ache, +nasal congestion, post nasal drip,  CV:  +  chest pain, orthopnea, PND, swelling in lower extremities, anasarca, dizziness, palpitations Resp: No-   shortness of breath with exertion or at rest.              No-   productive cough,  No non-productive cough,  No- coughing up of blood.              No-   change in color of mucus.  No- wheezing.   Skin: No-   rash or lesions. GI:  + heartburn,+ indigestion,  No-abdominal pain, nausea, vomiting,  GU: . MS:    +joint pain or swelling.  . Neuro-     nothing unusual Psych:  No- change in mood or affect. No depression, + anxiety.  No memory  loss.  OBJ- Physical Exam   + = pos General- Alert, Oriented, Affect-appropriate, Distress- none acute, + overweight. +Small mandible. Skin- rash-none, lesions- none, excoriation- none Lymphadenopathy- none Head- atraumatic            Eyes- Gross vision intact, PERRLA, conjunctivae and secretions clear            Ears- Hearing, canals-normal            Nose- Clear, no-Septal dev,  mucus, polyps, erosion, perforation             Throat- Mallampati II , mucosa clear , drainage- none, tonsils- atrophic.                        Own teeth, + retrognathia Neck- flexible , trachea midline, no stridor , thyroid nl, carotid no bruit Chest - symmetrical excursion , unlabored           Heart/CV- RRR , no murmur , no gallop  , no rub, nl s1 s2                           - JVD- none , edema- none, stasis changes- none, varices- none           Lung- clear to P&A/ no crackles heard, wheeze- none, cough- none , dullness-none, rub- none, rales-none           Chest wall-  Abd-  Br/ Gen/ Rectal- Not done, not indicated Extrem- cyanosis- none, clubbing, none, atrophy- none, strength- nl Neuro- grossly intact to observation

## 2019-02-14 NOTE — Patient Instructions (Signed)
Order- DME [facility]-   Please change CPAP range to auto 10-20, continue mask of choice, humidifier, supplies, AirView/ card Please send [place] a download on auto 10-20.   Please call if we can help

## 2019-02-22 DIAGNOSIS — R7303 Prediabetes: Secondary | ICD-10-CM | POA: Diagnosis not present

## 2019-02-22 DIAGNOSIS — Z7952 Long term (current) use of systemic steroids: Secondary | ICD-10-CM | POA: Diagnosis not present

## 2019-02-22 DIAGNOSIS — M130 Polyarthritis, unspecified: Secondary | ICD-10-CM | POA: Diagnosis not present

## 2019-02-22 DIAGNOSIS — I2699 Other pulmonary embolism without acute cor pulmonale: Secondary | ICD-10-CM | POA: Diagnosis not present

## 2019-02-22 DIAGNOSIS — M2550 Pain in unspecified joint: Secondary | ICD-10-CM | POA: Diagnosis not present

## 2019-03-21 DIAGNOSIS — I10 Essential (primary) hypertension: Secondary | ICD-10-CM | POA: Diagnosis not present

## 2019-03-21 DIAGNOSIS — F419 Anxiety disorder, unspecified: Secondary | ICD-10-CM | POA: Diagnosis not present

## 2019-04-11 DIAGNOSIS — N401 Enlarged prostate with lower urinary tract symptoms: Secondary | ICD-10-CM | POA: Diagnosis not present

## 2019-04-11 DIAGNOSIS — R351 Nocturia: Secondary | ICD-10-CM | POA: Diagnosis not present

## 2019-04-11 DIAGNOSIS — R3915 Urgency of urination: Secondary | ICD-10-CM | POA: Diagnosis not present

## 2019-04-11 DIAGNOSIS — N3281 Overactive bladder: Secondary | ICD-10-CM | POA: Diagnosis not present

## 2019-04-11 DIAGNOSIS — R350 Frequency of micturition: Secondary | ICD-10-CM | POA: Diagnosis not present

## 2019-04-14 NOTE — Assessment & Plan Note (Signed)
Not noting chest pain, cough or change in breathing. Plan- update imaging when he returns for in-person visit

## 2019-04-14 NOTE — Assessment & Plan Note (Signed)
Compliance and control good by description- televist. Plan- change range to auto 1-20 to address comfort. Request download from [facility]

## 2019-05-01 DIAGNOSIS — Z23 Encounter for immunization: Secondary | ICD-10-CM | POA: Diagnosis not present

## 2019-05-29 ENCOUNTER — Telehealth: Payer: Self-pay | Admitting: Cardiology

## 2019-05-29 NOTE — Telephone Encounter (Signed)
Wife of patient wanted to know if she would be able to come to the appointment on Friday. The patient has a new cardiac issue, and the wife would like to hear what the doctor has to say.  The wife is a former Cardiac nurse, and she wants to make sure the patient will give a complete medical history. The patient does not communicate full details with the doctors sometimes, and the wife does not want anything to be left out.

## 2019-05-29 NOTE — Telephone Encounter (Signed)
Spoke with patient - advised dur to policy - no visitors at this time.  Advised he can bring his cell phone and call wife during the appt (with her on speaker phone)  Also advised I am not able to speak with wife about his care as there is no DPR on file.  Pt states understanding.

## 2019-05-30 DIAGNOSIS — R3915 Urgency of urination: Secondary | ICD-10-CM | POA: Diagnosis not present

## 2019-05-30 DIAGNOSIS — N401 Enlarged prostate with lower urinary tract symptoms: Secondary | ICD-10-CM | POA: Diagnosis not present

## 2019-05-30 DIAGNOSIS — R351 Nocturia: Secondary | ICD-10-CM | POA: Diagnosis not present

## 2019-05-30 DIAGNOSIS — R350 Frequency of micturition: Secondary | ICD-10-CM | POA: Diagnosis not present

## 2019-05-31 DIAGNOSIS — H401331 Pigmentary glaucoma, bilateral, mild stage: Secondary | ICD-10-CM | POA: Diagnosis not present

## 2019-05-31 DIAGNOSIS — Z961 Presence of intraocular lens: Secondary | ICD-10-CM | POA: Diagnosis not present

## 2019-06-01 ENCOUNTER — Ambulatory Visit (INDEPENDENT_AMBULATORY_CARE_PROVIDER_SITE_OTHER): Payer: Medicare Other | Admitting: Cardiology

## 2019-06-01 ENCOUNTER — Telehealth: Payer: Self-pay | Admitting: Cardiology

## 2019-06-01 ENCOUNTER — Encounter: Payer: Self-pay | Admitting: Cardiology

## 2019-06-01 ENCOUNTER — Other Ambulatory Visit: Payer: Self-pay

## 2019-06-01 VITALS — BP 150/94 | HR 55 | Ht 67.0 in | Wt 188.4 lb

## 2019-06-01 DIAGNOSIS — R001 Bradycardia, unspecified: Secondary | ICD-10-CM

## 2019-06-01 DIAGNOSIS — Z79899 Other long term (current) drug therapy: Secondary | ICD-10-CM | POA: Diagnosis not present

## 2019-06-01 MED ORDER — TELMISARTAN 40 MG PO TABS: 40.0000 mg | ORAL_TABLET | Freq: Every day | ORAL | 3 refills | Status: DC

## 2019-06-01 NOTE — Telephone Encounter (Signed)
New Message    Patient going out of town and needs script for labs so patient can have it done where he's going.  Please call patient back to discuss.

## 2019-06-01 NOTE — Patient Instructions (Signed)
Medication Instructions:  Please start Telmisartan 40 mg by mouth daily.  Continue all other medications as listed.  *If you need a refill on your cardiac medications before your next appointment, please call your pharmacy*  Lab Work: Please have blood work in 2 weeks (BMP) If you have labs (blood work) drawn today and your tests are completely normal, you will receive your results only by:  [place] (if you have MyChart) OR  A paper copy in the mail If you have any lab test that is abnormal or we need to change your treatment, we will call you to review the results.  Testing/Procedures: Your physician has requested that you have an echocardiogram. Echocardiography is a painless test that uses sound waves to create images of your heart. It provides your doctor with information about the size and shape of your heart and how well your hearts chambers and valves are working. This procedure takes approximately one hour. There are no restrictions for this procedure.  Your physician has recommended that you wear a holter monitor for 14 days. Holter monitors are medical devices that record the hearts electrical activity. Doctors most often use these monitors to diagnose arrhythmias. Arrhythmias are problems with the speed or rhythm of the heartbeat. The monitor is a small, portable device. You can wear one while you do your normal daily activities. This is usually used to diagnose what is causing palpitations/syncope (passing out).  Follow-Up: At [facility], you and your health needs are our priority.  As part of our continuing mission to provide you with exceptional heart care, we have created designated Provider Care Teams.  These Care Teams include your primary Cardiologist (physician) and Advanced Practice Providers (APPs -  Physician Assistants and Nurse Practitioners) who all work together to provide you with the care you need, when you need it.  Your next appointment:   1  month  The format for your next appointment:   Virtual  Provider:   Kathyrn Drown, NP   Thank you for choosing [facility]!!     Echocardiogram An echocardiogram is a procedure that uses painless sound waves (ultrasound) to produce an image of the heart. Images from an echocardiogram can provide important information about:  Signs of coronary artery disease (CAD).  Aneurysm detection. An aneurysm is a weak or damaged part of an artery wall that bulges out from the normal force of blood pumping through the body.  Heart size and shape. Changes in the size or shape of the heart can be associated with certain conditions, including heart failure, aneurysm, and CAD.  Heart muscle function.  Heart valve function.  Signs of a past heart attack.  Fluid buildup around the heart.  Thickening of the heart muscle.  A tumor or infectious growth around the heart valves. Tell a health care provider about:  Any allergies you have.  All medicines you are taking, including vitamins, herbs, eye drops, creams, and over-the-counter medicines.  Any blood disorders you have.  Any surgeries you have had.  Any medical conditions you have.  Whether you are pregnant or may be pregnant. What are the risks? Generally, this is a safe procedure. However, problems may occur, including:  Allergic reaction to dye (contrast) that may be used during the procedure. What happens before the procedure? No specific preparation is needed. You may eat and drink normally. What happens during the procedure?   An IV tube may be inserted into one of your veins.  You may receive  contrast through this tube. A contrast is an injection that improves the quality of the pictures from your heart.  A gel will be applied to your chest.  A wand-like tool (transducer) will be moved over your chest. The gel will help to transmit the sound waves from the transducer.  The sound waves will harmlessly  bounce off of your heart to allow the heart images to be captured in real-time motion. The images will be recorded on a computer. The procedure may vary among health care providers and hospitals. What happens after the procedure?  You may return to your normal, everyday life, including diet, activities, and medicines, unless your health care provider tells you not to do that. Summary  An echocardiogram is a procedure that uses painless sound waves (ultrasound) to produce an image of the heart.  Images from an echocardiogram can provide important information about the size and shape of your heart, heart muscle function, heart valve function, and fluid buildup around your heart.  You do not need to do anything to prepare before this procedure. You may eat and drink normally.  After the echocardiogram is completed, you may return to your normal, everyday life, unless your health care provider tells you not to do that. This information is not intended to replace advice given to you by your health care provider. Make sure you discuss any questions you have with your health care provider. Document Released: 07/23/2000 Document Revised: 11/16/2018 Document Reviewed: 08/28/2016 Elsevier Patient Education  2020 [employer].   Ambulatory Cardiac Monitoring An ambulatory cardiac monitor is a small recording device that is used to detect abnormal heart rhythms (arrhythmias). Most monitors are connected by wires to flat, sticky disks (electrodes) that are then attached to your chest. You may need to wear a monitor if you have had symptoms such as:  Fast heartbeats (palpitations).  Dizziness.  Fainting or light-headedness.  Unexplained weakness.  Shortness of breath. There are several types of monitors. Some common monitors include:  Holter monitor. This records your heart rhythm continuously, usually for 24-48 hours.  Event (episodic) monitor. This monitor has a symptoms button, and when  pushed, it will begin recording. You need to activate this monitor to record when you have a heart-related symptom.  Automatic detection monitor. This monitor will begin recording when it detects an abnormal heartbeat. What are the risks? Generally, these devices are safe to use. However, it is possible that the skin under the electrodes will become irritated. How to prepare for monitoring Your health care provider will prepare your chest for the electrode placement and show you how to use the monitor.  Do not apply lotions to your chest before monitoring.  Follow directions on how to care for the monitor, and how to return the monitor when the testing period is complete. How to use your cardiac monitor  Follow directions about how long to wear the monitor, and if you can take the monitor off in order to shower or bathe. ? Do not let the monitor get wet. ? Do not bathe, swim, or use a hot tub while wearing the monitor.  Keep your skin clean. Do not put body lotion or moisturizer on your chest.  Change the electrodes as told by your health care provider, or any time they stop sticking to your skin. You may need to use medical tape to keep them on.  Try to put the electrodes in slightly different places on your chest to help prevent skin irritation. Follow  directions from your health care provider about where to place the electrodes.  Make sure the monitor is safely clipped to your clothing or in a location close to your body as recommended by your health care provider.  If your monitor has a symptoms button, press the button to mark an event as soon as you feel a heart-related symptom, such as: ? Dizziness. ? Weakness. ? Light-headedness. ? Palpitations. ? Thumping or pounding in your chest. ? Shortness of breath. ? Unexplained weakness.  Keep a diary of your activities, such as walking, doing chores, and taking medicine. It is very important to note what you were doing when you  pushed the button to record your symptoms. This will help your health care provider determine what might be contributing to your symptoms.  Send the recorded information as recommended by your health care provider. It may take some time for your health care provider to process the results.  Change the batteries as told by your health care provider.  Keep electronic devices away from your monitor. These include: ? Tablets. ? MP3 players. ? Cell phones.  While wearing your monitor you should avoid: ? Electric blankets. ? Armed forces operational officer. ? Electric toothbrushes. ? Microwave ovens. ? Magnets. ? Metal detectors. Get help right away if:  You have chest pain.  You have shortness of breath or extreme difficulty breathing.  You develop a very fast heartbeat that does not get better.  You develop dizziness that does not go away.  You faint or constantly feel like you are about to faint. Summary  An ambulatory cardiac monitor is a small recording device that is used to detect abnormal heart rhythms (arrhythmias).  Make sure you understand how to send the information from the monitor to your health care provider.  It is important to press the button on the monitor when you have any heart-related symptoms.  Keep a diary of your activities, such as walking, doing chores, and taking medicine. It is very important to note what you were doing when you pushed the button to record your symptoms. This will help your health care provider learn what might be causing your symptoms. This information is not intended to replace advice given to you by your health care provider. Make sure you discuss any questions you have with your health care provider. Document Released: 05/04/2008 Document Revised: 07/08/2017 Document Reviewed: 07/10/2016 Elsevier Patient Education  2020 [employer].

## 2019-06-01 NOTE — Telephone Encounter (Signed)
Called patient to discuss with him as I do not have a DPR on file to speak with wife regarding pt's medical care.  Pt states his wife is there with him on speaker phone.  Advised pt I can leave a signed script for the lab work with the ladies in the lobby.  He states understanding and that he will pick this up when he comes for his echo scheduled for next week.  Wife is asking if B12 level can be added to pt's lab.  Advised I will discuss with Dr Marlou Porch and add to the order if he approves.   Per Dr Marlou Porch - he would prefer pt's PCP order and follow B12 levels.

## 2019-06-01 NOTE — Progress Notes (Signed)
Cardiology Office Note:    Date:  06/01/2019   ID:  Anthony [name], DOB [birth year], MRN [id number]  PCP:  Lujean Amel, MD  Cardiologist:  No primary care provider on file.  Electrophysiologist:  None   Referring MD: Maurice Small, MD     History of Present Illness:    Anthony [name] is a 68 y.o. male here for the evaluation of bradycardia at the request of Dr. Justin Mend.   Pulmonary embolism detected 08/15/2017 at [place], Eliquis begun then.  Also has sleep apnea taken care of by Dr. Annamaria Boots.  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 Wife is a Marine scientist. [employer].   BP has been going up, new meds to help. Feels weak at times, relaxed. Still very active, fish 5 hours. Tired.   ?PE cause, no clotting issues. No injury. No DVT's. 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 Calf hurt. Was SOB, HR 100. Syncope at the time. 6 months eliquis. [id number] smoked. Lightheaded at times.  He is also been seeing a rheumatologist, since November, had started low-dose prednisone to help him with joint issues.  His sister has polymyalgia rheumatica, PMR.  Blood pressure could correlate with prednisone use.  He also gets hives he states.  Currently on amlodipine 10 mg.  No side effects such as edema in lower extremities.  Wife would like [place] to help with his blood pressure.  Enjoys fishing, at [employer].  Denies any fevers chills nausea vomiting.  No chest pain.  Past Medical History:  Diagnosis Date  . 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 Depression   . GERD (gastroesophageal reflux disease)   . Glaucoma   . Headache(784.0)   . Obstructive sleep apnea 12/20/2012   NPSG 10/01/12- AHI 16.2/ hr, weight 172 lbs, mild limb jerks CPAP auto> 9 cwp APS   . Psoriasis   . Pulmonary embolism ([place]) 08/22/2017   Multiple pulmonary emboli identified on CTa chest 08/15/17 at [facility]. Report in  Media.  Eliquis begun 08/2017  . Seasonal allergies   . Sleep apnea    using a cpap-moderate    Past Surgical History:  Procedure Laterality Date  . APPENDECTOMY    . CARPAL TUNNEL WITH CUBITAL TUNNEL  2008   right  . CARPAL TUNNEL WITH CUBITAL TUNNEL  2009   and ulnar nerve-left  . ESOPHAGEAL MANOMETRY N/A 10/25/2018   Procedure: ESOPHAGEAL MANOMETRY (EM);  Surgeon: Wilford Corner, MD;  Location: [facility];  Service: Endoscopy;  Laterality: N/A;  . EXTERNAL EAR SURGERY     ears reset surgically age 25  . [place] IMPEDANCE STUDY N/A 10/25/2018   Procedure: [place] IMPEDANCE STUDY;  Surgeon: Wilford Corner, MD;  Location: [facility];  Service: Endoscopy;  Laterality: N/A;  . RETINAL DETACHMENT SURGERY  2004   right  . RETINAL DETACHMENT SURGERY  2002   left  . SHOULDER ARTHROSCOPY  2006   left  . TONSILLECTOMY    . TRIGGER FINGER RELEASE Left 02/15/2013   Procedure: RELEASE TRIGGER FINGER/A-1 PULLEY LEFT LONG FINGER;  Surgeon: Cammie Sickle., MD;  Location: [place];  Service: Orthopedics;  Laterality: Left;  . TRIGGER FINGER RELEASE Left 09/06/2013   Procedure: RELEASE TRIGGER FINGER/A-1 PULLEY;  Surgeon: Cammie Sickle., MD;  Location: [place];  Service: Orthopedics;  Laterality: Left;  . TRIGGER FINGER RELEASE Right 08/05/2014  Procedure: RIGHT SMALL TRIGGER RELEASE ;  Surgeon: Leanora Cover, MD;  Location: [place];  Service: Orthopedics;  Laterality: Right;  . ULNAR NERVE TRANSPOSITION Left 09/06/2013   Procedure: LEFT ANTERIOR SUBCUTANEOUS TRANSPOSITION;  Surgeon: Cammie Sickle., MD;  Location: [place];  Service: Orthopedics;  Laterality: Left;    Current Medications: Current Meds  Medication Sig  . ALPRAZolam (XANAX) 0.5 MG tablet Take 0.5 mg by mouth at bedtime as needed for anxiety.  Marland Kitchen amLODipine (NORVASC) 10 MG tablet Take 10 mg by mouth daily.  Marland Kitchen aspirin EC 81 MG tablet Take 81 mg by mouth  daily.  . Clobetasol Propionate (TEMOVATE) 0.05 % external spray Apply topically 2 (two) times daily.  Marland Kitchen EPINEPHrine 0.3 mg/0.3 mL IJ SOAJ injection   . escitalopram (LEXAPRO) 10 MG tablet Take 10 mg by mouth daily.  Marland Kitchen latanoprost (XALATAN) 0.005 % ophthalmic solution 1 drop OU HS  . Multiple Vitamins-Minerals (PRESERVISION AREDS PO) Take 1 capsule by mouth daily.  . predniSONE (DELTASONE) 5 MG tablet 5 mg daily. Takes 5 mg total; 1/2 in AM & 1/2 in PM  . ranitidine (ZANTAC) 150 MG capsule Take 150 mg by mouth as needed for heartburn.  . tacrolimus (PROTOPIC) 0.1 % ointment Apply topically as needed.  . tamsulosin (FLOMAX) 0.4 MG CAPS capsule Take 0.4 mg by mouth daily.     Allergies:   Augmentin [amoxicillin-pot clavulanate], Levaquin [levofloxacin], Nsaids, Suvorexant, Tolmetin, and Trazodone   Social History   Socioeconomic History  . Marital status: Married    Spouse name: Not on file  . Number of children: Not on file  . Years of education: Not on file  . Highest education level: Not on file  Occupational History  . Not on file  Social Needs  . Financial resource strain: Not on file  . Food insecurity    Worry: Not on file    Inability: Not on file  . Transportation needs    Medical: Not on file    Non-medical: Not on file  Tobacco Use  . Smoking status: Former Smoker    Packs/day: 1.00    Years: 5.00    Pack years: 5.00    Types: Cigarettes    Quit date: 08/09/1970    Years since quitting: 48.8  . Smokeless tobacco: Never Used  . Tobacco comment: Very Rare cigar use  Substance and Sexual Activity  . Alcohol use: Yes    Comment: rare  . Drug use: Not on file  . Sexual activity: Not on file  Lifestyle  . Physical activity    Days per week: Not on file    Minutes per session: Not on file  . Stress: Not on file  Relationships  . Social Herbalist on phone: Not on file    Gets together: Not on file    Attends religious service: Not on file    Active  member of club or organization: Not on file    Attends meetings of clubs or organizations: Not on file    Relationship status: Not on file  Other Topics Concern  . Not on file  Social History Narrative  . Not on file     Family History: The patient's family history is not on file.  No early CAD  ROS:   Please see the history of present illness.     All other systems reviewed and are negative.  EKGs/Labs/Other Studies Reviewed:    The  following studies were reviewed today: Echo pending  LDL 150 HDL 39 triglycerides 199 hemoglobin A1c 6.0 hemoglobin 14.4 creatinine 0.96 potassium 4.3 ALT 34 TSH 2.6  EKG:  EKG is ordered today.  The ekg ordered today demonstrates 06/01/2019-sinus bradycardia 53 with nonspecific T wave changes no other abnormalities.  Recent Labs: No results found for requested labs within last 8760 hours.  Recent Lipid Panel No results found for: CHOL, TRIG, HDL, CHOLHDL, VLDL, LDLCALC, LDLDIRECT  Physical Exam:    VS:  BP (!) 150/94   Pulse (!) 55   Ht 5\' 7"  (1.702 m)   Wt 188 lb 6.4 oz (85.5 kg)   SpO2 98%   BMI 29.51 kg/m     Wt Readings from Last 3 Encounters:  06/01/19 188 lb 6.4 oz (85.5 kg)  10/25/18 187 lb (84.8 kg)  11/21/17 191 lb (86.6 kg)     GEN:  Well nourished, well developed in no acute distress HEENT: Normal NECK: No JVD; No carotid bruits LYMPHATICS: No lymphadenopathy CARDIAC: RRR, no murmurs, rubs, gallops RESPIRATORY:  Clear to auscultation without rales, wheezing or rhonchi  ABDOMEN: Soft, non-tender, non-distended MUSCULOSKELETAL:  No edema; No deformity  SKIN: Warm and dry NEUROLOGIC:  Alert and oriented x 3 PSYCHIATRIC:  Normal affect   ASSESSMENT:    1. Bradycardia   2. Medication management    PLAN:    In order of problems listed above:  Bradycardia -I will check a Zio patch monitor to ensure that he has chronotropic competence.  Pulse on home blood pressure monitoring ranges from 47-65.  Near syncope  -Very rare to have this experience.  Back when he had PEs, he fainted twice.  Checking echocardiogram.  Essential hypertension -Prednisone could be playing a role, started in November 2019.  Rheumatologic reasons.  We will combat with addition of telmisartan 40 mg once a day.  Continue with amlodipine 10 mg.  Next step would be to add HCTZ 12.5 mg once a day. -Check basic metabolic profile in 1 to 2 weeks after starting angiotensin receptor blocker. -Also discussed limiting salt intake.  Avoiding decongestants avoiding excessive alcohol.  Prior PE -Treated for 6 months.  Unprovoked.  Extensive hematologic work-up.  Negative.  Currently on aspirin 81.  We will have him have a virtual visit with Sharee Pimple in about 1 month to review his new medication and blood pressure readings.   Medication Adjustments/Labs and Tests Ordered: Current medicines are reviewed at length with the patient today.  Concerns regarding medicines are outlined above.  Orders Placed This Encounter  Procedures  . Basic metabolic panel  . LONG TERM MONITOR (3-14 DAYS)  . EKG 12-Lead  . ECHOCARDIOGRAM COMPLETE   Meds ordered this encounter  Medications  . telmisartan (MICARDIS) 40 MG tablet    Sig: Take 1 tablet (40 mg total) by mouth daily.    Dispense:  90 tablet    Refill:  3    Patient Instructions  Medication Instructions:  Please start Telmisartan 40 mg by mouth daily.  Continue all other medications as listed.  *If you need a refill on your cardiac medications before your next appointment, please call your pharmacy*  Lab Work: Please have blood work in 2 weeks (BMP) If you have labs (blood work) drawn today and your tests are completely normal, you will receive your results only by: Marland Kitchen MyChart Message (if you have MyChart) OR . A paper copy in the mail If you have any lab test that is abnormal or  we need to change your treatment, we will call you to review the results.  Testing/Procedures: Your physician  has requested that you have an echocardiogram. Echocardiography is a painless test that uses sound waves to create images of your heart. It provides your doctor with information about the size and shape of your heart and how well your heart's chambers and valves are working. This procedure takes approximately one hour. There are no restrictions for this procedure.  Your physician has recommended that you wear a holter monitor for 14 days. Holter monitors are medical devices that record the heart's electrical activity. Doctors most often use these monitors to diagnose arrhythmias. Arrhythmias are problems with the speed or rhythm of the heartbeat. The monitor is a small, portable device. You can wear one while you do your normal daily activities. This is usually used to diagnose what is causing palpitations/syncope (passing out).  Follow-Up: At [facility], you and your health needs are our priority.  As part of our continuing mission to provide you with exceptional heart care, we have created designated Provider Care Teams.  These Care Teams include your primary Cardiologist (physician) and Advanced Practice Providers (APPs -  Physician Assistants and Nurse Practitioners) who all work together to provide you with the care you need, when you need it.  Your next appointment:   1 month  The format for your next appointment:   Virtual  Provider:   Kathyrn Drown, NP   Thank you for choosing [facility]!!     Echocardiogram An echocardiogram is a procedure that uses painless sound waves (ultrasound) to produce an image of the heart. Images from an echocardiogram can provide important information about:  Signs of coronary artery disease (CAD).  Aneurysm detection. An aneurysm is a weak or damaged part of an artery wall that bulges out from the normal force of blood pumping through the body.  Heart size and shape. Changes in the size or shape of the heart can be associated with  certain conditions, including heart failure, aneurysm, and CAD.  Heart muscle function.  Heart valve function.  Signs of a past heart attack.  Fluid buildup around the heart.  Thickening of the heart muscle.  A tumor or infectious growth around the heart valves. Tell a health care provider about:  Any allergies you have.  All medicines you are taking, including vitamins, herbs, eye drops, creams, and over-the-counter medicines.  Any blood disorders you have.  Any surgeries you have had.  Any medical conditions you have.  Whether you are pregnant or may be pregnant. What are the risks? Generally, this is a safe procedure. However, problems may occur, including:  Allergic reaction to dye (contrast) that may be used during the procedure. What happens before the procedure? No specific preparation is needed. You may eat and drink normally. What happens during the procedure?   An IV tube may be inserted into one of your veins.  You may receive contrast through this tube. A contrast is an injection that improves the quality of the pictures from your heart.  A gel will be applied to your chest.  A wand-like tool (transducer) will be moved over your chest. The gel will help to transmit the sound waves from the transducer.  The sound waves will harmlessly bounce off of your heart to allow the heart images to be captured in real-time motion. The images will be recorded on a computer. The procedure may vary among health care providers and hospitals. What  happens after the procedure?  You may return to your normal, everyday life, including diet, activities, and medicines, unless your health care provider tells you not to do that. Summary  An echocardiogram is a procedure that uses painless sound waves (ultrasound) to produce an image of the heart.  Images from an echocardiogram can provide important information about the size and shape of your heart, heart muscle function,  heart valve function, and fluid buildup around your heart.  You do not need to do anything to prepare before this procedure. You may eat and drink normally.  After the echocardiogram is completed, you may return to your normal, everyday life, unless your health care provider tells you not to do that. This information is not intended to replace advice given to you by your health care provider. Make sure you discuss any questions you have with your health care provider. Document Released: 07/23/2000 Document Revised: 11/16/2018 Document Reviewed: 08/28/2016 Elsevier Patient Education  2020 [place].   Ambulatory Cardiac Monitoring An ambulatory cardiac monitor is a small recording device that is used to detect abnormal heart rhythms (arrhythmias). Most monitors are connected by wires to flat, sticky disks (electrodes) that are then attached to your chest. You may need to wear a monitor if you have had symptoms such as:  Fast heartbeats (palpitations).  Dizziness.  Fainting or light-headedness.  Unexplained weakness.  Shortness of breath. There are several types of monitors. Some common monitors include:  Holter monitor. This records your heart rhythm continuously, usually for 24-48 hours.  Event (episodic) monitor. This monitor has a symptoms button, and when pushed, it will begin recording. You need to activate this monitor to record when you have a heart-related symptom.  Automatic detection monitor. This monitor will begin recording when it detects an abnormal heartbeat. What are the risks? Generally, these devices are safe to use. However, it is possible that the skin under the electrodes will become irritated. How to prepare for monitoring Your health care provider will prepare your chest for the electrode placement and show you how to use the monitor.  Do not apply lotions to your chest before monitoring.  Follow directions on how to care for the monitor, and how to  return the monitor when the testing period is complete. How to use your cardiac monitor  Follow directions about how long to wear the monitor, and if you can take the monitor off in order to shower or bathe. ? Do not let the monitor get wet. ? Do not bathe, swim, or use a hot tub while wearing the monitor.  Keep your skin clean. Do not put body lotion or moisturizer on your chest.  Change the electrodes as told by your health care provider, or any time they stop sticking to your skin. You may need to use medical tape to keep them on.  Try to put the electrodes in slightly different places on your chest to help prevent skin irritation. Follow directions from your health care provider about where to place the electrodes.  Make sure the monitor is safely clipped to your clothing or in a location close to your body as recommended by your health care provider.  If your monitor has a symptoms button, press the button to mark an event as soon as you feel a heart-related symptom, such as: ? Dizziness. ? Weakness. ? Light-headedness. ? Palpitations. ? Thumping or pounding in your chest. ? Shortness of breath. ? Unexplained weakness.  Keep a diary of your activities,  such as walking, doing chores, and taking medicine. It is very important to note what you were doing when you pushed the button to record your symptoms. This will help your health care provider determine what might be contributing to your symptoms.  Send the recorded information as recommended by your health care provider. It may take some time for your health care provider to process the results.  Change the batteries as told by your health care provider.  Keep electronic devices away from your monitor. These include: ? Tablets. ? MP3 players. ? Cell phones.  While wearing your monitor you should avoid: ? Electric blankets. ? Armed forces operational officer. ? Electric toothbrushes. ? Microwave ovens. ? Magnets. ? Metal detectors. Get  help right away if:  You have chest pain.  You have shortness of breath or extreme difficulty breathing.  You develop a very fast heartbeat that does not get better.  You develop dizziness that does not go away.  You faint or constantly feel like you are about to faint. Summary  An ambulatory cardiac monitor is a small recording device that is used to detect abnormal heart rhythms (arrhythmias).  Make sure you understand how to send the information from the monitor to your health care provider.  It is important to press the button on the monitor when you have any heart-related symptoms.  Keep a diary of your activities, such as walking, doing chores, and taking medicine. It is very important to note what you were doing when you pushed the button to record your symptoms. This will help your health care provider learn what might be causing your symptoms. This information is not intended to replace advice given to you by your health care provider. Make sure you discuss any questions you have with your health care provider. Document Released: 05/04/2008 Document Revised: 07/08/2017 Document Reviewed: 07/10/2016 Elsevier Patient Education  2020 [place], MD  06/01/2019 10:13 AM    [place]

## 2019-06-07 ENCOUNTER — Ambulatory Visit (HOSPITAL_COMMUNITY): Payer: Medicare Other | Attending: Cardiovascular Disease

## 2019-06-07 ENCOUNTER — Encounter (INDEPENDENT_AMBULATORY_CARE_PROVIDER_SITE_OTHER): Payer: Self-pay

## 2019-06-07 ENCOUNTER — Other Ambulatory Visit: Payer: Self-pay

## 2019-06-07 DIAGNOSIS — R001 Bradycardia, unspecified: Secondary | ICD-10-CM | POA: Insufficient documentation

## 2019-06-12 ENCOUNTER — Telehealth: Payer: Self-pay | Admitting: *Deleted

## 2019-06-12 NOTE — Telephone Encounter (Signed)
14 day ZIO XT long term holter monitor mailed to patients beach [address] [place], [place], [place] [address].   Instructions reviewed briefly as they are included in the monitor kit.

## 2019-06-15 ENCOUNTER — Other Ambulatory Visit: Payer: Self-pay

## 2019-06-15 ENCOUNTER — Other Ambulatory Visit: Payer: Medicare Other | Admitting: *Deleted

## 2019-06-15 DIAGNOSIS — R001 Bradycardia, unspecified: Secondary | ICD-10-CM | POA: Diagnosis not present

## 2019-06-15 DIAGNOSIS — Z79899 Other long term (current) drug therapy: Secondary | ICD-10-CM | POA: Diagnosis not present

## 2019-06-15 LAB — BASIC METABOLIC PANEL
BUN/Creatinine Ratio: 12 (ref 10–24)
BUN: 13 mg/dL (ref 8–27)
CO2: 21 mmol/L (ref 20–29)
Calcium: 9.5 mg/dL (ref 8.6–10.2)
Chloride: 100 mmol/L (ref 96–106)
Creatinine, Ser: 1.05 mg/dL (ref 0.76–1.27)
GFR calc Af Amer: 84 mL/min/{1.73_m2} (ref >59)
GFR calc non Af Amer: 73 mL/min/{1.73_m2} (ref >59)
Glucose: 98 mg/dL (ref 65–99)
Potassium: 4.4 mmol/L (ref 3.5–5.2)
Sodium: 138 mmol/L (ref 134–144)

## 2019-06-19 ENCOUNTER — Ambulatory Visit (INDEPENDENT_AMBULATORY_CARE_PROVIDER_SITE_OTHER): Payer: Medicare Other

## 2019-06-19 DIAGNOSIS — R001 Bradycardia, unspecified: Secondary | ICD-10-CM

## 2019-06-25 NOTE — Progress Notes (Signed)
Virtual Visit via Telephone Note   This visit type was conducted due to national recommendations for restrictions regarding the COVID-19 Pandemic (e.g. social distancing) in an effort to limit this patient's exposure and mitigate transmission in our community.  Due to his co-morbid illnesses, this patient is at least at moderate risk for complications without adequate follow up.  This format is felt to be most appropriate for this patient at this time.  The patient did not have access to video technology/had technical difficulties with video requiring transitioning to audio format only (telephone).  All issues noted in this document were discussed and addressed.  No physical exam could be performed with this format.  Please refer to the patient's chart for his  consent to telehealth for [facility].   Date:  07/03/2019   ID:  Anthony [name], DOB [birth year], MRN [id number]  Patient Location: Home Provider Location: Home  PCP:  Lujean Amel, MD  Cardiologist:  Candee Furbish, MD  Electrophysiologist:  None   Evaluation Performed:  Follow-Up Visit  Chief Complaint: Follow-up, seen for Dr. Marlou Porch  History of Present Illness:    Anthony [name] is a 68 y.o. male recently seen as a referral for bradycardia at the request of Dr. Justin Mend, seen by Dr. Marlou Porch.  Has a history of pulmonary embolism 08/15/2017 detected at [facility] with no specific etiology found.  No history of clotting disorder or no injury. 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 Eliquis therapy initiated and has since been discontinued after 6 months. Also with a history of sleep apnea treated by Dr. Annamaria Boots. Followed by rheumatology neurologist since 06/2018 for joint pain treated with low-dose prednisone. Sister known to have PMR.  Patient seen by Dr. Marlou Porch 06/01/2019.  At that time, was noted to have bradycardia however has had this for many years. BP was also  noted to be fairly labile thought to be related to prednisone therapy.  He was placed on a 2-week Zio patch placed to ensure chronotropic competence with close follow-up.  Fortunately, he is to send his Zio patch back today and therefore results likely will not be available for another 1 and half to 2 weeks.  Given his labile blood pressures, Telmisartan 40 mg added to antihypertensive regimen. Next step noted to be HCTZ 12.5 mg once daily if no significant response  Today, patient reports he is doing fairly well from a cardiac perspective however does have intermittent DOE which has worsened slightly since last telemedicine visit.  Reports not similar to prior symptoms of PE.  Has continued to be very active.  States his shortness of breath is most often with activity and will resolve with minimal rest.  Denies chest pain, orthopnea, LE swelling, dizziness, diaphoresis or presyncopal or syncopal episodes.  States that his BP is more controlled with SBP's in the 120-130 range and that his DOE likely is related to more recent adjustment to lower BPs.  Discussed monitoring his symptoms closely and contacting us if the symptoms worsen.  Echocardiogram from 06/07/2019 stable with no LV or valvular dysfunction.  Does have a remote history of tobacco use. No hx of CAD.   Has been staying safe from Covid including masking, handwashing and social distancing.  The patient does not have symptoms concerning for COVID-19 infection (fever, chills, cough, or new shortness of breath).   Past Medical History:  Diagnosis Date  . Asbestos exposure 09/14/2016  Worked as a Chief Financial Officer many years ago, disturbing asbestos insulation and also smoking at that time  . Depression   . GERD (gastroesophageal reflux disease)   . Glaucoma   . Headache(784.0)   . Obstructive sleep apnea 12/20/2012   NPSG 10/01/12- AHI 16.2/ hr, weight 172 lbs, mild limb jerks CPAP auto> 9 cwp APS   . Psoriasis   . Pulmonary embolism  ([place]) 08/22/2017   Multiple pulmonary emboli identified on CTa chest 08/15/17 at [facility]. Report in Media.  Eliquis begun 08/2017  . Seasonal allergies   . Sleep apnea    using a cpap-moderate   Past Surgical History:  Procedure Laterality Date  . APPENDECTOMY    . CARPAL TUNNEL WITH CUBITAL TUNNEL  2008   right  . CARPAL TUNNEL WITH CUBITAL TUNNEL  2009   and ulnar nerve-left  . ESOPHAGEAL MANOMETRY N/A 10/25/2018   Procedure: ESOPHAGEAL MANOMETRY (EM);  Surgeon: Wilford Corner, MD;  Location: [facility];  Service: Endoscopy;  Laterality: N/A;  . EXTERNAL EAR SURGERY     ears reset surgically age 20  . [place] IMPEDANCE STUDY N/A 10/25/2018   Procedure: [place] IMPEDANCE STUDY;  Surgeon: Wilford Corner, MD;  Location: [facility];  Service: Endoscopy;  Laterality: N/A;  . RETINAL DETACHMENT SURGERY  2004   right  . RETINAL DETACHMENT SURGERY  2002   left  . SHOULDER ARTHROSCOPY  2006   left  . TONSILLECTOMY    . TRIGGER FINGER RELEASE Left 02/15/2013   Procedure: RELEASE TRIGGER FINGER/A-1 PULLEY LEFT LONG FINGER;  Surgeon: Cammie Sickle., MD;  Location: [place];  Service: Orthopedics;  Laterality: Left;  . TRIGGER FINGER RELEASE Left 09/06/2013   Procedure: RELEASE TRIGGER FINGER/A-1 PULLEY;  Surgeon: Cammie Sickle., MD;  Location: [place];  Service: Orthopedics;  Laterality: Left;  . TRIGGER FINGER RELEASE Right 08/05/2014   Procedure: RIGHT SMALL TRIGGER RELEASE ;  Surgeon: Leanora Cover, MD;  Location: [place];  Service: Orthopedics;  Laterality: Right;  . ULNAR NERVE TRANSPOSITION Left 09/06/2013   Procedure: LEFT ANTERIOR SUBCUTANEOUS TRANSPOSITION;  Surgeon: Cammie Sickle., MD;  Location: [place];  Service: Orthopedics;  Laterality: Left;     Current Meds  Medication Sig  . ALPRAZolam (XANAX) 0.5 MG tablet Take 0.5 mg by mouth at bedtime as needed for anxiety.  Marland Kitchen amLODipine (NORVASC) 10 MG  tablet Take 10 mg by mouth daily.  Marland Kitchen aspirin EC 81 MG tablet Take 81 mg by mouth daily.  . Clobetasol Propionate (TEMOVATE) 0.05 % external spray Apply topically 2 (two) times daily.  Marland Kitchen EPINEPHrine 0.3 mg/0.3 mL IJ SOAJ injection   . escitalopram (LEXAPRO) 10 MG tablet Take 10 mg by mouth daily.  Marland Kitchen latanoprost (XALATAN) 0.005 % ophthalmic solution Place 1 drop into both eyes at bedtime. 1 drop OU HS  . Multiple Vitamins-Minerals (PRESERVISION AREDS PO) Take 1 capsule by mouth daily.  . predniSONE (DELTASONE) 5 MG tablet 5 mg daily. Takes 5 mg total; 1/2 in AM & 1/2 in PM  . ranitidine (ZANTAC) 150 MG capsule Take 150 mg by mouth as needed for heartburn.  . tacrolimus (PROTOPIC) 0.1 % ointment Apply topically as needed.  Marland Kitchen telmisartan (MICARDIS) 40 MG tablet Take 1 tablet (40 mg total) by mouth daily.     Allergies:   Augmentin [amoxicillin-pot clavulanate], Levaquin [levofloxacin], Nsaids, Suvorexant, Tolmetin, and Trazodone   Social History   Tobacco Use  .  Smoking status: Former Smoker    Packs/day: 1.00    Years: 5.00    Pack years: 5.00    Types: Cigarettes    Quit date: 08/09/1970    Years since quitting: 48.9  . Smokeless tobacco: Never Used  . Tobacco comment: Very Rare cigar use  Substance Use Topics  . Alcohol use: Yes    Comment: rare  . Drug use: Not on file    Family Hx: The patient's family history is not on file.  ROS:   Please see the history of present illness.     All other systems reviewed and are negative.  Prior CV studies:   The following studies were reviewed today:  Echocardiogram 06/07/2019:  Left ventricular ejection fraction, by visual estimation, is 65 to 70%. The left ventricle has hyperdynamic function. There is no left ventricular hypertrophy. 2. Left ventricular diastolic parameters are consistent with Grade II diastolic dysfunction (pseudonormalization). 3. Global right ventricle has normal systolic function.The right ventricular size is  normal. No increase in right ventricular wall thickness. 4. Left atrial size was normal. 5. Right atrial size was normal. 6. The mitral valve is normal in structure. No evidence of mitral valve regurgitation. No evidence of mitral stenosis. 7. The tricuspid valve is normal in structure. Tricuspid valve regurgitation is not demonstrated. 8. The aortic valve is normal in structure. Aortic valve regurgitation is not visualized. No evidence of aortic valve sclerosis or stenosis. 9. The pulmonic valve was normal in structure. Pulmonic valve regurgitation is not visualized. 10. Normal pulmonary artery systolic pressure. 11. The atrial septum is grossly normal.  Labs/Other Tests and Data Reviewed:    EKG:  An ECG dated 06/01/2019 was personally reviewed today and demonstrated:  Sinus bradycardia, HR 53 bpm and no acute ischemic changes  Recent Labs: 06/15/2019: BUN 13; Creatinine, Ser 1.05; Potassium 4.4; Sodium 138   Recent Lipid Panel No results found for: CHOL, TRIG, HDL, CHOLHDL, LDLCALC, LDLDIRECT  Wt Readings from Last 3 Encounters:  07/03/19 186 lb 9.6 oz (84.6 kg)  06/01/19 188 lb 6.4 oz (85.5 kg)  10/25/18 187 lb (84.8 kg)     Objective:    Vital Signs:  BP 134/81   Pulse (!) 55   Temp (!) 96.4 F (35.8 C)   Ht 5\' 7"  (1.702 m)   Wt 186 lb 9.6 oz (84.6 kg)   SpO2 92%   BMI 29.23 kg/m    VITAL SIGNS:  reviewed GEN:  no acute distress NEURO:  alert and oriented x 3, no obvious focal deficit PSYCH:  normal affect  ASSESSMENT & PLAN:    1.  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  2.  DOE: -Reports mild, fleeting DOE that has somewhat worsened since last telemedicine visit -Echocardiogram from 05/2019 stable with normal LV function and no valvular disease -Denies anginal symptoms with no  history of CAD -We will monitor symptoms for now.  Patient reports likely in the setting of new telmisartan medication -If continues to have symptoms, may transition to new medicine versus further work-up?  3.  Essential hypertension: -Stable, 134/81 today with home reports of SBP's in the 120-130 range  -Previously on amlodipine 10 -Telmisartan 40 mg initiated 06/01/2019 -Next step noted to be HCTZ 12.5 mg once daily>>> no need currently however if needed to transition off the telmisartan  in the future could initiate HCTZ 12.5 mg as above -Hypertension exacerbated by prednisone therapy   COVID-19 Education: The signs and symptoms of COVID-19 were discussed with the patient and how to seek care for testing (follow up with PCP or arrange E-visit).  The importance of social distancing was discussed today.  Time:   Today, I have spent 20 minutes with the patient with telehealth technology discussing the above problems.     Medication Adjustments/Labs and Tests Ordered: Current medicines are reviewed at length with the patient today.  Concerns regarding medicines are outlined above.   Tests Ordered: No orders of the defined types were placed in this encounter.   Medication Changes: No orders of the defined types were placed in this encounter.   Follow Up:  Virtual Visit  Myself or Dr. Marlou Porch in 3 to 4 weeks  Signed, Kathyrn Drown, NP  07/03/2019 9:48 AM    [place]

## 2019-07-03 ENCOUNTER — Encounter: Payer: Self-pay | Admitting: Cardiology

## 2019-07-03 ENCOUNTER — Other Ambulatory Visit: Payer: Self-pay

## 2019-07-03 ENCOUNTER — Telehealth (INDEPENDENT_AMBULATORY_CARE_PROVIDER_SITE_OTHER): Payer: Medicare Other | Admitting: Cardiology

## 2019-07-03 VITALS — BP 134/81 | HR 55 | Temp 96.4°F | Ht 67.0 in | Wt 186.6 lb

## 2019-07-03 DIAGNOSIS — R001 Bradycardia, unspecified: Secondary | ICD-10-CM

## 2019-07-03 DIAGNOSIS — I2602 Saddle embolus of pulmonary artery with acute cor pulmonale: Secondary | ICD-10-CM

## 2019-07-03 DIAGNOSIS — R0609 Other forms of dyspnea: Secondary | ICD-10-CM

## 2019-07-03 DIAGNOSIS — R0600 Dyspnea, unspecified: Secondary | ICD-10-CM | POA: Diagnosis not present

## 2019-07-03 NOTE — Patient Instructions (Addendum)
Medication Instructions:  Your physician recommends that you continue on your current medications as directed. Please refer to the Current Medication list given to you today.  *If you need a refill on your cardiac medications before your next appointment, please call your pharmacy*  Lab Work:  None ordered today  Testing/Procedures:  None ordered today  Follow-Up:  On 08/06/19 at 9:30AM with Kathyrn Drown, NP

## 2019-07-10 DIAGNOSIS — R002 Palpitations: Secondary | ICD-10-CM | POA: Diagnosis not present

## 2019-07-23 ENCOUNTER — Telehealth: Payer: Self-pay | Admitting: Cardiology

## 2019-07-23 NOTE — Telephone Encounter (Signed)
Sounds like a degree of orthostatic hypotension.  Why do we go ahead and cut the telmisartan down to 20 mg a day from 40.  With or without mask, there should be no real change in blood pressure.  This is likely just coincidence.  Continue with your appointment with Sharee Pimple in the next several days.  Candee Furbish, MD

## 2019-07-23 NOTE — Telephone Encounter (Signed)
New message    Patient calling to report when he is wearing a mask he feels faint.  STAT if patient feels like he/she is going to faint   1) Are you dizzy now? no  2) Do you feel faint or have you passed out? Not at this moment  3) Do you have any other symptoms? dizziness  4) Have you checked your HR and BP (record if available)? 110/67 HR 91 with mask on     1. What are your last 5 BP readings?   143/69 HR 61 133/76 HR 53 135/78 HR 54 127/76 HR 59 110/67 HR 91-mask on 132/76 hr 59   2. Are you having any other symptoms (ex. Dizziness, headache, blurred vision, passed out)? Blurred vision  3. What is your BP issue? Patient wants to know if medication needs to be adjusted

## 2019-07-23 NOTE — Telephone Encounter (Signed)
Spoke with patient who is reporting he has been feeling some "detachment" and "anxiety"  He also reports yesterday whil at the store with a mask on he was squatting down to check prices and became dizzy when he stood up.  This has happened several times.  He decided to try to replicate his s/s - when he did this he wore the mask BP was 110/67 HR 91 and 02 sat 95% and he felt dizzy, about 1/2 hr later with mask off 137/77 HR 79, 02 sat 95%.  Pt is wanting to know why his HR is so much higher with the mask on and why he is getting dizzy.  Advised I will forward this information to Dr Marlou Porch for his review and will call back with any further information.

## 2019-07-24 MED ORDER — TELMISARTAN 20 MG PO TABS: 20.0000 mg | ORAL_TABLET | Freq: Every day | ORAL | 3 refills | Status: DC

## 2019-07-24 NOTE — Progress Notes (Signed)
Cardiology Telemedicine  Note   Date:  08/06/2019   ID:  Logun, Ch[place] [birth year], MRN [id number]  PCP:  Lujean Amel, MD  Cardiologist: Dr. Marlou Porch, MD  Chief Complaint  Patient presents with  . Follow-up    History of Present Illness: Anthony [name] is a 68 y.o. male who presents for follow-up, seen for Dr. Marlou Porch  Has a history of pulmonary embolism 08/15/2017 detected at [facility] with no specific etiology found.  No history of clotting disorder or no injury. 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 Eliquis therapy initiated and has since been discontinued after 6 months. Also with a history of sleep apnea treated by Dr. Annamaria Boots. Followed by rheumatology neurologist since 06/2018 for joint pain treated with low-dose prednisone. Sister known to have PMR.  Patient seen by Dr. Marlou Porch 06/01/2019.  At that time, was noted to have bradycardia however has had this for many years. BP was also noted to be fairly labile thought to be related to prednisone therapy.  He was placed on a 2-week Zio patch placed to ensure chronotropic competence with close follow-up. Given his labile blood pressures, Telmisartan 40 mg added to antihypertensive regimen. Next step noted to be HCTZ 12.5 mg once daily if no significant response.   He was last seen by myself 07/03/2019 and was doing fairly well from a cardiac perspective.  Was having intermittent DOE which was noted to have worsened slightly since previous telemedicine visit with Dr. Marlou Porch. Reported not similar to prior PE symptoms.  Reported shortness of breath was most often with activity and would resolve with minimal rest.  He denied chest pain, orthopnea, LE swelling.  BP was more controlled with SBP's in the 120-130 range.  Zio patch placed with return results 07/13/2019 which showed normal sinus rhythm with occasional first-degree AV block (benign), average heart rate of 63  bpm, rare, 4 episodes of paroxysmal atrial tachycardia (benign), diary entries reported as chest pain, lightheadedness well associated with normal heart rhythm and no adverse changes with these events felt to be reassuring per Dr. Marlou Porch.  Today [name] reports that he continues to have dizzy spells without syncope, most often in the setting of squatting to standing positions.  We reviewed monitor results as above, reassuring.  Wife states that his BP has been more controlled.  Did have 1 episode with a systolic BP at [id number] which is low for him.  Heart rates have been stable however, remain in the low normal range.  As above, this is been a chronic issue for him.  Symptoms appear to be orthostatic in nature as they are occurring most often from the squatting to standing position.  I have recommended that he increase his fluid volume intake and monitor vitals around his dizzy episodes.  We discussed needing an in person follow-up appointment for EKG to assess rhythm.  I feel reassured with his monitor results as above however symptoms still concerning given no clear picture as to cause.  Past Medical History:  Diagnosis Date  . 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 Depression   . GERD (gastroesophageal reflux disease)   . Glaucoma   . Headache(784.0)   . Obstructive sleep apnea 12/20/2012   NPSG 10/01/12- AHI 16.2/ hr, weight 172 lbs, mild limb jerks CPAP auto>  9 cwp APS   . Psoriasis   . Pulmonary embolism ([place]) 08/22/2017   Multiple pulmonary emboli identified on CTa chest 08/15/17 at [facility]. Report in Media.  Eliquis begun 08/2017  . Seasonal allergies   . Sleep apnea    using a cpap-moderate    Past Surgical History:  Procedure Laterality Date  . APPENDECTOMY    . CARPAL TUNNEL WITH CUBITAL TUNNEL  2008   right  . CARPAL TUNNEL WITH CUBITAL TUNNEL  2009   and ulnar  nerve-left  . ESOPHAGEAL MANOMETRY N/A 10/25/2018   Procedure: ESOPHAGEAL MANOMETRY (EM);  Surgeon: Wilford Corner, MD;  [facility];  Service: Endoscopy;  Laterality: N/A;  . EXTERNAL EAR SURGERY     ears reset surgically age 89  . [place] IMPEDANCE STUDY N/A 10/25/2018   Procedure: [place] IMPEDANCE STUDY;  Surgeon: Wilford Corner, MD;  [facility];  Service: Endoscopy;  Laterality: N/A;  . RETINAL DETACHMENT SURGERY  2004   right  . RETINAL DETACHMENT SURGERY  2002   left  . SHOULDER ARTHROSCOPY  2006   left  . TONSILLECTOMY    . TRIGGER FINGER RELEASE Left 02/15/2013   Procedure: RELEASE TRIGGER FINGER/A-1 PULLEY LEFT LONG FINGER;  Surgeon: Cammie Sickle., MD;  Location: [place];  Service: Orthopedics;  Laterality: Left;  . TRIGGER FINGER RELEASE Left 09/06/2013   Procedure: RELEASE TRIGGER FINGER/A-1 PULLEY;  Surgeon: Cammie Sickle., MD;  Location: [place];  Service: Orthopedics;  Laterality: Left;  . TRIGGER FINGER RELEASE Right 08/05/2014   Procedure: RIGHT SMALL TRIGGER RELEASE ;  Surgeon: Leanora Cover, MD;  Location: [place];  Service: Orthopedics;  Laterality: Right;  . ULNAR NERVE TRANSPOSITION Left 09/06/2013   Procedure: LEFT ANTERIOR SUBCUTANEOUS TRANSPOSITION;  Surgeon: Cammie Sickle., MD;  Location: [place];  Service: Orthopedics;  Laterality: Left;     Current Outpatient Medications  Medication Sig Dispense Refill  . ALPRAZolam (XANAX) 0.5 MG tablet Take 0.5 mg by mouth at bedtime as needed for anxiety.    Marland Kitchen amLODipine (NORVASC) 5 MG tablet Take 5 mg by mouth daily.    Marland Kitchen aspirin EC 81 MG tablet Take 81 mg by mouth daily.    . Clobetasol Propionate (TEMOVATE) 0.05 % external spray Apply topically 2 (two) times daily.    Marland Kitchen EPINEPHrine 0.3 mg/0.3 mL IJ SOAJ injection     . escitalopram (LEXAPRO) 20 MG tablet Take 20 mg by mouth daily.    Marland Kitchen latanoprost (XALATAN) 0.005 %  ophthalmic solution Place 1 drop into both eyes at bedtime. 1 drop OU HS    . Multiple Vitamins-Minerals (PRESERVISION AREDS PO) Take 1 capsule by mouth daily.    . predniSONE (DELTASONE) 5 MG tablet 5 mg daily. Takes 5 mg total; 1/2 in AM & 1/2 in PM    . tacrolimus (PROTOPIC) 0.1 % ointment Apply topically as needed.    Marland Kitchen telmisartan (MICARDIS) 20 MG tablet Take 1 tablet (20 mg total) by mouth daily. 90 tablet 3  . valACYclovir (VALTREX) 500 MG tablet as needed.     No current facility-administered medications for this visit.    Allergies:   Augmentin [amoxicillin-pot clavulanate], Levaquin [levofloxacin], Nsaids, Suvorexant, Tolmetin, and Trazodone    Social History:  The patient  reports that he quit smoking about 49 years ago. His smoking use included cigarettes. He has a 5.00 pack-year smoking history. He has never used smokeless tobacco. He  reports current alcohol use.   Family History:  The patient's family history is not on file.  ROS:  Please see the history of present illness.   Otherwise, review of systems are positive for none. All other systems are reviewed and negative.    PHYSICAL EXAM: VS:  BP 137/72   Pulse (!) 52   Temp (!) 96.7 F (35.9 C)   Wt 186 lb 9.6 oz (84.6 kg)   SpO2 94%   BMI 29.23 kg/m  , BMI Body mass index is 29.23 kg/m.   General: NAD Neuro: Alert and oriented. No focal deficits. No facial asymmetry. MAE spontaneously. Psych: Responds to questions appropriately with normal affect.    EKG:  EKG is not ordered today.   Recent Labs: 06/15/2019: BUN 13; Creatinine, Ser 1.05; Potassium 4.4; Sodium 138    Lipid Panel No results found for: CHOL, TRIG, HDL, CHOLHDL, VLDL, LDLCALC, LDLDIRECT    Wt Readings from Last 3 Encounters:  08/06/19 186 lb 9.6 oz (84.6 kg)  07/03/19 186 lb 9.6 oz (84.6 kg)  06/01/19 188 lb 6.4 oz (85.5 kg)      Other studies Reviewed: Additional studies/ records that were reviewed today include:   Echocardiogram  06/07/2019:  Left ventricular ejection fraction, by visual estimation, is 65 to 70%. The left ventricle has hyperdynamic function. There is no left ventricular hypertrophy. 2. Left ventricular diastolic parameters are consistent with Grade II diastolic dysfunction (pseudonormalization). 3. Global right ventricle has normal systolic function.The right ventricular size is normal. No increase in right ventricular wall thickness. 4. Left atrial size was normal. 5. Right atrial size was normal. 6. The mitral valve is normal in structure. No evidence of mitral valve regurgitation. No evidence of mitral stenosis. 7. The tricuspid valve is normal in structure. Tricuspid valve regurgitation is not demonstrated. 8. The aortic valve is normal in structure. Aortic valve regurgitation is not visualized. No evidence of aortic valve sclerosis or stenosis. 9. The pulmonic valve was normal in structure. Pulmonic valve regurgitation is not visualized. 10. Normal pulmonary artery systolic pressure. 11. The atrial septum is grossly normal.   ASSESSMENT AND PLAN:  1.  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 Marlou Porch.   2.  DOE: -Reports mild, fleeting/unchanged DOE that has somewhat worsened since last telemedicine visit -Echocardiogram from 05/2019 stable with normal LV function and no valvular disease -Denies anginal symptoms with no history of CAD  3.  Essential hypertension: -Wife reports BPs of 110/67, 135/77 -Heart rates ranging in the mid 50s to highest at 91 bpm -Continue  amlodipine 5 -Telmisartan reduced to 20 mg  Current medicines are reviewed at length with the patient today.  The patient does not have concerns regarding medicines.  The following changes have been made:  no change  Labs/ tests ordered today include: None   No orders of the defined types were placed in this encounter.   Disposition:   FU with Dr. Marlou Porch in 1 month  Signed, Kathyrn Drown, NP  08/06/2019 10:27 AM    [address] [place], [place],   [address] Phone: [phone]; Fax: [phone]

## 2019-07-24 NOTE — Telephone Encounter (Signed)
I spoke with pt and gave him information from Dr Marlou Porch.  Will send new prescription for lower dose of telmisartan to [facility] on [address]. Pt aware of telemedicine visit on 12/28

## 2019-08-06 ENCOUNTER — Telehealth (INDEPENDENT_AMBULATORY_CARE_PROVIDER_SITE_OTHER): Payer: Medicare Other | Admitting: Cardiology

## 2019-08-06 ENCOUNTER — Other Ambulatory Visit: Payer: Self-pay

## 2019-08-06 ENCOUNTER — Ambulatory Visit: Payer: Medicare Other | Admitting: Cardiology

## 2019-08-06 ENCOUNTER — Encounter: Payer: Self-pay | Admitting: Cardiology

## 2019-08-06 VITALS — BP 137/72 | HR 52 | Temp 96.7°F | Wt 186.6 lb

## 2019-08-06 DIAGNOSIS — Z86711 Personal history of pulmonary embolism: Secondary | ICD-10-CM

## 2019-08-06 DIAGNOSIS — R0600 Dyspnea, unspecified: Secondary | ICD-10-CM

## 2019-08-06 DIAGNOSIS — I10 Essential (primary) hypertension: Secondary | ICD-10-CM

## 2019-08-06 DIAGNOSIS — R001 Bradycardia, unspecified: Secondary | ICD-10-CM

## 2019-08-06 MED ORDER — AMLODIPINE BESYLATE 5 MG PO TABS: 5.0000 mg | ORAL_TABLET | Freq: Every day | ORAL | 6 refills | Status: DC

## 2019-08-06 NOTE — Patient Instructions (Addendum)
Medication Instructions:  Your physician has recommended you make the following change in your medication:   1) Decrease your Amlodipine to 5MG , 1 tablet by mouth once a day  *If you need a refill on your cardiac medications before your next appointment, please call your pharmacy*  Lab Work:  None ordered today  Testing/Procedures:  None ordered today  Follow-Up: At [facility], you and your health needs are our priority.  As part of our continuing mission to provide you with exceptional heart care, we have created designated Provider Care Teams.  These Care Teams include your primary Cardiologist (physician) and Advanced Practice Providers (APPs -  Physician Assistants and Nurse Practitioners) who all work together to provide you with the care you need, when you need it.  Your next appointment:    On 09/19/19 at 10:00AM with  Candee Furbish, MD

## 2019-08-17 ENCOUNTER — Telehealth: Payer: Medicare Other | Admitting: Cardiology

## 2019-08-29 DIAGNOSIS — I2699 Other pulmonary embolism without acute cor pulmonale: Secondary | ICD-10-CM | POA: Diagnosis not present

## 2019-08-29 DIAGNOSIS — M130 Polyarthritis, unspecified: Secondary | ICD-10-CM | POA: Diagnosis not present

## 2019-08-29 DIAGNOSIS — Z7952 Long term (current) use of systemic steroids: Secondary | ICD-10-CM | POA: Diagnosis not present

## 2019-08-29 DIAGNOSIS — R7303 Prediabetes: Secondary | ICD-10-CM | POA: Diagnosis not present

## 2019-09-04 DIAGNOSIS — Z961 Presence of intraocular lens: Secondary | ICD-10-CM | POA: Diagnosis not present

## 2019-09-04 DIAGNOSIS — H401331 Pigmentary glaucoma, bilateral, mild stage: Secondary | ICD-10-CM | POA: Diagnosis not present

## 2019-09-05 ENCOUNTER — Ambulatory Visit: Payer: Medicare Other

## 2019-09-13 ENCOUNTER — Ambulatory Visit: Payer: Medicare Other | Admitting: Cardiology

## 2019-09-14 ENCOUNTER — Ambulatory Visit: Payer: Medicare Other | Attending: Internal Medicine

## 2019-09-14 DIAGNOSIS — Z23 Encounter for immunization: Secondary | ICD-10-CM | POA: Insufficient documentation

## 2019-09-14 NOTE — Progress Notes (Signed)
   Covid-19 Vaccination Clinic  Name:  Anthony [name]    MRN: [id number] DOB: February 05, 1951  09/14/2019  Mr. Anthony [name] was observed post Covid-19 immunization for 15 minutes without incidence. He was provided with Vaccine Information Sheet and instruction to access the V-Safe system.   Mr. Anthony [name] was instructed to call 911 with any severe reactions post vaccine: Marland Kitchen Difficulty breathing  . Swelling of your face and throat  . A fast heartbeat  . A bad rash all over your body  . Dizziness and weakness    Immunizations Administered    Name Date Dose VIS Date Route   Pfizer COVID-19 Vaccine 09/14/2019  9:02 AM 0.3 mL 07/20/2019 Intramuscular   Manufacturer: [place]   Lot: [id number]   [place]: [id number]

## 2019-09-19 ENCOUNTER — Encounter: Payer: Self-pay | Admitting: Cardiology

## 2019-09-19 ENCOUNTER — Other Ambulatory Visit: Payer: Self-pay

## 2019-09-19 ENCOUNTER — Ambulatory Visit (INDEPENDENT_AMBULATORY_CARE_PROVIDER_SITE_OTHER): Payer: Medicare Other | Admitting: Cardiology

## 2019-09-19 VITALS — BP 120/80 | HR 60 | Ht 67.0 in | Wt 193.0 lb

## 2019-09-19 DIAGNOSIS — I10 Essential (primary) hypertension: Secondary | ICD-10-CM | POA: Diagnosis not present

## 2019-09-19 DIAGNOSIS — R001 Bradycardia, unspecified: Secondary | ICD-10-CM

## 2019-09-19 NOTE — Patient Instructions (Signed)
Medication Instructions:  The current medical regimen is effective;  continue present plan and medications.  *If you need a refill on your cardiac medications before your next appointment, please call your pharmacy*  Follow-Up: At [facility], you and your health needs are our priority.  As part of our continuing mission to provide you with exceptional heart care, we have created designated Provider Care Teams.  These Care Teams include your primary Cardiologist (physician) and Advanced Practice Providers (APPs -  Physician Assistants and Nurse Practitioners) who all work together to provide you with the care you need, when you need it.  Your next appointment:   12 month(s)  The format for your next appointment:   In Person  Provider:   Mark Skains, MD   Thank you for choosing [facility]!!     

## 2019-09-19 NOTE — Progress Notes (Signed)
Cardiology Office Note:    Date:  09/19/2019   ID:  Anthony [name], DOB [birth year], MRN [id number]  PCP:  Lujean Amel, MD  Cardiologist:  Candee Furbish, MD  Electrophysiologist:  None   Referring MD: Lujean Amel, MD     History of Present Illness:    Anthony [name] is a 69 y.o. male here for follow-up prior PE 2019 at [facility], DVT.  Sleep apnea.  Bradycardia.  This has been seen for many years.  Also treated for blood pressure.  Zio patch monitor demonstrated chronotropic competence.  This was placed on 07/13/2019.  Average heart rate was 63 4 episodes of benign PAT.  Has had dizzy spells most often squatting to standing.  Blood pressure 110 at times at home.  Orthostatic.  Increase fluid volume at times.  COVID 1st.   PE 6 months Eliquis.  Negative DVT, negative hypercoagulable work-up, no further recurrence  Pred- rheum Tacrolimus - derm Rash with BP cuff.   Past Medical History:  Diagnosis Date  . 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 Depression   . GERD (gastroesophageal reflux disease)   . Glaucoma   . Headache(784.0)   . Obstructive sleep apnea 12/20/2012   NPSG 10/01/12- AHI 16.2/ hr, weight 172 lbs, mild limb jerks CPAP auto> 9 cwp APS   . Psoriasis   . Pulmonary embolism ([place]) 08/22/2017   Multiple pulmonary emboli identified on CTa chest 08/15/17 at [facility]. Report in [place].  Eliquis begun 08/2017  . Seasonal allergies   . Sleep apnea    using a cpap-moderate    Past Surgical History:  Procedure Laterality Date  . APPENDECTOMY    . CARPAL TUNNEL WITH CUBITAL TUNNEL  2008   right  . CARPAL TUNNEL WITH CUBITAL TUNNEL  2009   and ulnar nerve-left  . ESOPHAGEAL MANOMETRY N/A 10/25/2018   Procedure: ESOPHAGEAL MANOMETRY (EM);  Surgeon: Wilford Corner, MD;  Location: [facility];  Service: Endoscopy;  Laterality: N/A;  .  EXTERNAL EAR SURGERY     ears reset surgically age 1  . [place] IMPEDANCE STUDY N/A 10/25/2018   Procedure: [place] IMPEDANCE STUDY;  Surgeon: Wilford Corner, MD;  Location: [facility];  Service: Endoscopy;  Laterality: N/A;  . RETINAL DETACHMENT SURGERY  2004   right  . RETINAL DETACHMENT SURGERY  2002   left  . SHOULDER ARTHROSCOPY  2006   left  . TONSILLECTOMY    . TRIGGER FINGER RELEASE Left 02/15/2013   Procedure: RELEASE TRIGGER FINGER/A-1 PULLEY LEFT LONG FINGER;  Surgeon: Cammie Sickle., MD;  Location: [place];  Service: Orthopedics;  Laterality: Left;  . TRIGGER FINGER RELEASE Left 09/06/2013   Procedure: RELEASE TRIGGER FINGER/A-1 PULLEY;  Surgeon: Cammie Sickle., MD;  Location: [place];  Service: Orthopedics;  Laterality: Left;  . TRIGGER FINGER RELEASE Right 08/05/2014   Procedure: RIGHT SMALL TRIGGER RELEASE ;  Surgeon: Leanora Cover, MD;  Location: [place];  Service: Orthopedics;  Laterality: Right;  . ULNAR NERVE TRANSPOSITION Left 09/06/2013   Procedure: LEFT ANTERIOR SUBCUTANEOUS TRANSPOSITION;  Surgeon: Cammie Sickle., MD;  Location: [place];  Service: Orthopedics;  Laterality: Left;    Current Medications: Current Meds  Medication Sig  . ALPRAZolam (XANAX) 0.5 MG tablet Take 0.5 mg by mouth at bedtime as needed for anxiety.  Marland Kitchen  amLODipine (NORVASC) 5 MG tablet Take 1 tablet (5 mg total) by mouth daily.  Marland Kitchen aspirin EC 81 MG tablet Take 81 mg by mouth daily.  . Clobetasol Propionate (TEMOVATE) 0.05 % external spray Apply topically 2 (two) times daily.  Marland Kitchen EPINEPHrine 0.3 mg/0.3 mL IJ SOAJ injection   . escitalopram (LEXAPRO) 20 MG tablet Take 20 mg by mouth daily.  Marland Kitchen latanoprost (XALATAN) 0.005 % ophthalmic solution Place 1 drop into both eyes at bedtime. 1 drop OU HS  . Multiple Vitamins-Minerals (PRESERVISION AREDS PO) Take 1 capsule by mouth daily.  . predniSONE (DELTASONE) 5 MG tablet 5 mg  daily. Takes 5 mg total; 1/2 in AM & 1/2 in PM  . rosuvastatin (CRESTOR) 5 MG tablet Take 5 mg by mouth daily.  . tacrolimus (PROTOPIC) 0.1 % ointment Apply topically as needed.  Marland Kitchen telmisartan (MICARDIS) 20 MG tablet Take 1 tablet (20 mg total) by mouth daily.  . valACYclovir (VALTREX) 500 MG tablet as needed.     Allergies:   Augmentin [amoxicillin-pot clavulanate], Levaquin [levofloxacin], Nsaids, Suvorexant, Tolmetin, and Trazodone   Social History   Socioeconomic History  . Marital status: Married    Spouse name: Not on file  . Number of children: Not on file  . Years of education: Not on file  . Highest education level: Not on file  Occupational History  . Not on file  Tobacco Use  . Smoking status: Former Smoker    Packs/day: 1.00    Years: 5.00    Pack years: 5.00    Types: Cigarettes    Quit date: 08/09/1970    Years since quitting: 49.1  . Smokeless tobacco: Never Used  . Tobacco comment: Very Rare cigar use  Substance and Sexual Activity  . Alcohol use: Yes    Comment: rare  . Drug use: Not on file  . Sexual activity: Not on file  Other Topics Concern  . Not on file  Social History Narrative  . Not on file   Social Determinants of Health   Financial Resource Strain:   . Difficulty of Paying Living Expenses: Not on file  Food Insecurity:   . Worried About Charity fundraiser in the Last Year: Not on file  . Ran Out of Food in the Last Year: Not on file  Transportation Needs:   . Lack of Transportation (Medical): Not on file  . Lack of Transportation (Non-Medical): Not on file  Physical Activity:   . Days of Exercise per Week: Not on file  . Minutes of Exercise per Session: Not on file  Stress:   . Feeling of Stress : Not on file  Social Connections:   . Frequency of Communication with Friends and Family: Not on file  . Frequency of Social Gatherings with Friends and Family: Not on file  . Attends Religious Services: Not on file  . Active Member of  Clubs or Organizations: Not on file  . Attends Archivist Meetings: Not on file  . Marital Status: Not on file     Family History: The patient's family history is not on file.  No early family history of CAD  ROS:   Please see the history of present illness.    No bleeding no fevers no chills no nausea all other systems reviewed and are negative.  EKGs/Labs/Other Studies Reviewed:    The following studies were reviewed today: Echo 2020-EF [id number] grade 2 diastolic dysfunction trace TR  EKG:  EKG is  ordered  today.  The ekg ordered today demonstrates 09/19/2019-sinus rhythm 60 no other abnormalities.  Recent Labs: 06/15/2019: BUN 13; Creatinine, Ser 1.05; Potassium 4.4; Sodium 138  Recent Lipid Panel No results found for: CHOL, TRIG, HDL, CHOLHDL, VLDL, LDLCALC, LDLDIRECT  Physical Exam:    VS:  BP 120/80   Pulse 60   Ht 5\' 7"  (1.702 m)   Wt 193 lb (87.5 kg)   SpO2 98%   BMI 30.23 kg/m     Wt Readings from Last 3 Encounters:  09/19/19 193 lb (87.5 kg)  08/06/19 186 lb 9.6 oz (84.6 kg)  07/03/19 186 lb 9.6 oz (84.6 kg)     GEN:  Well nourished, well developed in no acute distress HEENT: Normal NECK: No JVD; No carotid bruits LYMPHATICS: No lymphadenopathy CARDIAC: RRR, no murmurs, rubs, gallops RESPIRATORY:  Clear to auscultation without rales, wheezing or rhonchi  ABDOMEN: Soft, non-tender, non-distended MUSCULOSKELETAL:  No edema; No deformity  SKIN: Warm and dry NEUROLOGIC:  Alert and oriented x 3 PSYCHIATRIC:  Normal affect   ASSESSMENT:    1. Bradycardia   2. Essential hypertension    PLAN:    In order of problems listed above:  Bradycardia -Prior history of this.  Ranges from mid 50s to 90s, usual for him.  Dizziness has been noticed but this sounds more orthostatic in origin.  Continue to hydrate.  Zio patch monitor has been reassuring.  No changes made.  Avoid AV nodal blocking agents.  Dyspnea on exertion -Stable EF LV function.  Continue  with exercise.  No history of CAD.  Essential hypertension -Excellent control at home.  Previously telmisartan was reduced to 20 mg a day.  Continue with amlodipine 5.  Doing well. Creatinine 0.95, LDL 120, hemoglobin A1c 5.9, ALT 22 outside labs.  Rash -Followed by dermatology, tacrolimus, prednisone for tendon issues.   Medication Adjustments/Labs and Tests Ordered: Current medicines are reviewed at length with the patient today.  Concerns regarding medicines are outlined above.  Orders Placed This Encounter  Procedures  . EKG 12-Lead   No orders of the defined types were placed in this encounter.   Patient Instructions  Medication Instructions:  The current medical regimen is effective;  continue present plan and medications.  *If you need a refill on your cardiac medications before your next appointment, please call your pharmacy*  Follow-Up: At [facility], you and your health needs are our priority.  As part of our continuing mission to provide you with exceptional heart care, we have created designated Provider Care Teams.  These Care Teams include your primary Cardiologist (physician) and Advanced Practice Providers (APPs -  Physician Assistants and Nurse Practitioners) who all work together to provide you with the care you need, when you need it.  Your next appointment:   12 month(s)  The format for your next appointment:   In Person  Provider:   Candee Furbish, MD  Thank you for choosing [facility]!!        Signed, Candee Furbish, MD  09/19/2019 10:25 AM    [place]

## 2019-09-26 ENCOUNTER — Ambulatory Visit: Payer: Medicare Other

## 2019-10-09 ENCOUNTER — Ambulatory Visit: Payer: Medicare Other | Attending: Internal Medicine

## 2019-10-09 DIAGNOSIS — Z23 Encounter for immunization: Secondary | ICD-10-CM | POA: Insufficient documentation

## 2019-10-09 NOTE — Progress Notes (Signed)
   Covid-19 Vaccination Clinic  Name:  Anthony [name]    MRN: [id number] DOB: [birth year]  10/09/2019  [name] was observed post Covid-19 immunization for 15 minutes without incident. He was provided with Vaccine Information Sheet and instruction to access the V-Safe system.   [name] was instructed to call 911 with any severe reactions post vaccine: Marland Kitchen Difficulty breathing  . Swelling of face and throat  . A fast heartbeat  . A bad rash all over body  . Dizziness and weakness   Immunizations Administered    Name Date Dose VIS Date Route   Pfizer COVID-19 Vaccine 10/09/2019  9:46 AM 0.3 mL 07/20/2019 Intramuscular   Manufacturer: [place]   Lot: [id number]   [place]: [id number]

## 2019-12-31 DIAGNOSIS — Z1159 Encounter for screening for other viral diseases: Secondary | ICD-10-CM | POA: Diagnosis not present

## 2020-01-02 DIAGNOSIS — R12 Heartburn: Secondary | ICD-10-CM | POA: Diagnosis not present

## 2020-01-02 DIAGNOSIS — K2930 Chronic superficial gastritis without bleeding: Secondary | ICD-10-CM | POA: Diagnosis not present

## 2020-01-02 DIAGNOSIS — K2970 Gastritis, unspecified, without bleeding: Secondary | ICD-10-CM | POA: Diagnosis not present

## 2020-01-02 DIAGNOSIS — K2270 Barrett's esophagus without dysplasia: Secondary | ICD-10-CM | POA: Diagnosis not present

## 2020-01-02 DIAGNOSIS — K3189 Other diseases of stomach and duodenum: Secondary | ICD-10-CM | POA: Diagnosis not present

## 2020-01-02 DIAGNOSIS — K2100 Gastro-esophageal reflux disease with esophagitis, without bleeding: Secondary | ICD-10-CM | POA: Diagnosis not present

## 2020-01-03 DIAGNOSIS — H401331 Pigmentary glaucoma, bilateral, mild stage: Secondary | ICD-10-CM | POA: Diagnosis not present

## 2020-01-03 DIAGNOSIS — Z961 Presence of intraocular lens: Secondary | ICD-10-CM | POA: Diagnosis not present

## 2020-01-03 IMAGING — MR MRI HEAD WITHOUT AND WITH CONTRAST
11 series · 40 of 48 positions shown · IV contrast (multihance)
Comparison: None

CLINICAL DATA: Loss of sense of taste and smell after viral
infection in [REDACTED] of last year.

Creatinine was obtained on site at [HOSPITAL] at [HOSPITAL].
Results: Creatinine 1.2 mg/dL.
EXAM:
MRI HEAD WITHOUT AND WITH CONTRAST
TECHNIQUE: Multiplanar, multiecho pulse sequences of the brain and surrounding
structures were obtained without and with intravenous contrast.
CONTRAST:  17mL MULTIHANCE GADOBENATE DIMEGLUMINE 529 MG/ML IV SOLN

[Series 3: t1_se_sag · sagittal · 5.0mm · 0.45mm/px · 3 of 23 slices shown]
[im 1/23]
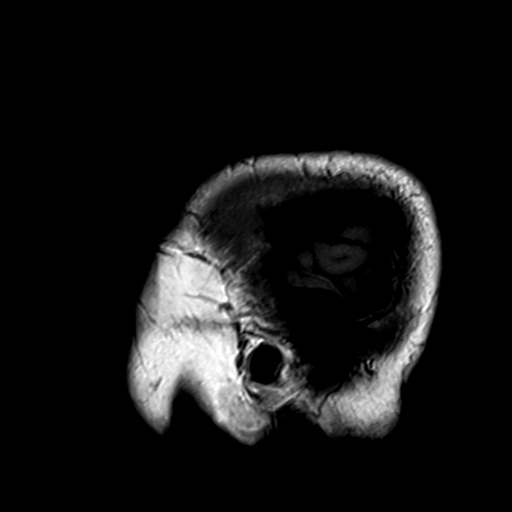
[im 12/23]
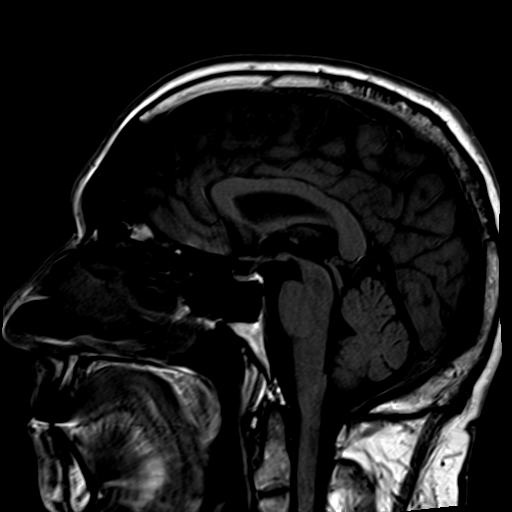
[im 23/23]
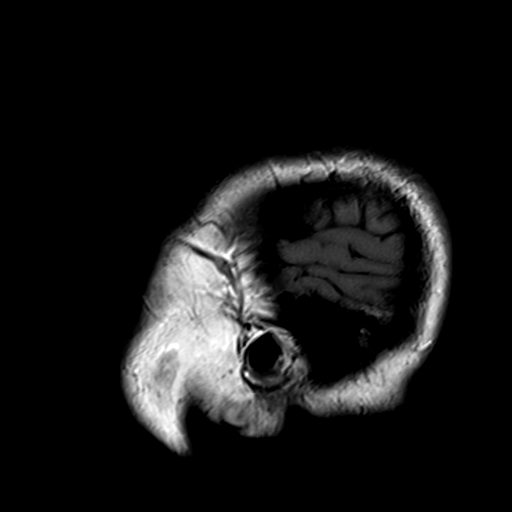

[Series 5: swi_images · axial · 2.0mm · 0.90mm/px · z∈[-81,+88]mm · 7 of 88 slices shown]
[im 1/88]
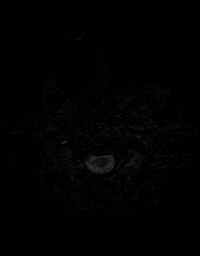
[im 15/88]
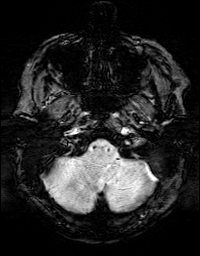
[im 30/88]
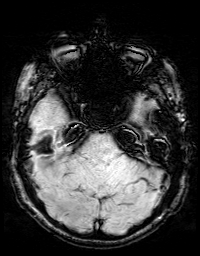
[im 44/88]
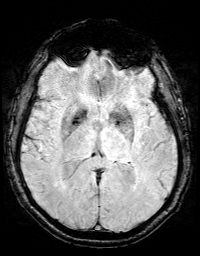
[im 59/88]
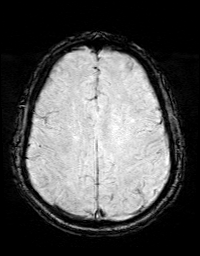
[im 73/88]
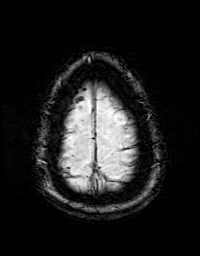
[im 88/88]
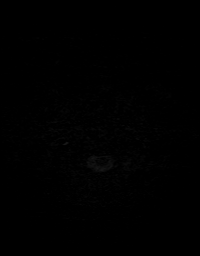

[Series 6: t2_tse_tra_512 · axial · 5.0mm · 0.60mm/px · z∈[-72,+79]mm · 2 of 25 slices shown]
[im 1/25]
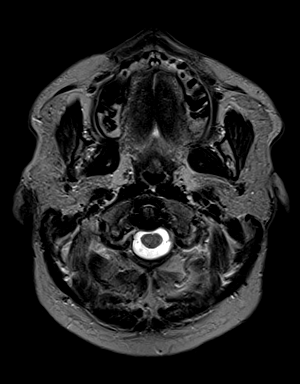
[im 25/25]
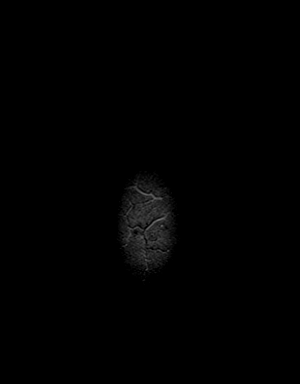

[Series 7: ep2d_diff_(id)_trace · axial · 5.0mm · 1.80mm/px · z∈[-75,+82]mm · 4 of 52 slices shown]
[im 1/52]
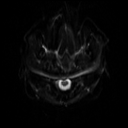
[im 18/52]
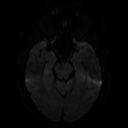
[im 35/52]
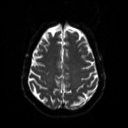
[im 52/52]
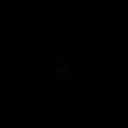

[Series 8: ep2d_diff_(id)_trace_adc · axial · 5.0mm · 1.80mm/px · z∈[-75,+82]mm · 2 of 27 slices shown]
[im 1/27]
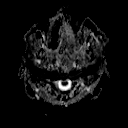
[im 27/27]
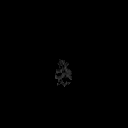

[Series 9: FLAIR · axial · 3.0mm · 0.43mm/px · z∈[-77,+81]mm · 2 of 28 slices shown]
[im 1/28]
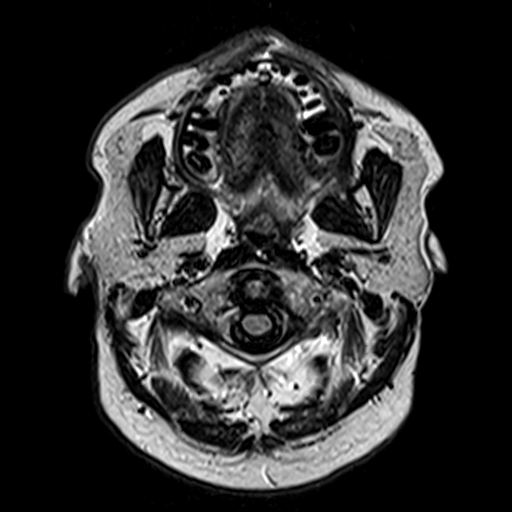
[im 28/28]
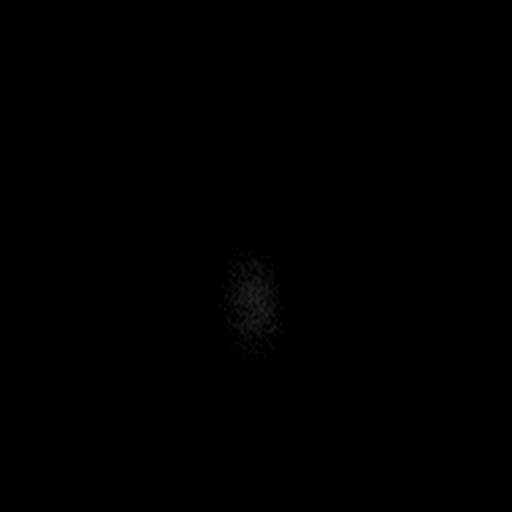

[Series 10: t2_cor_fs_3mm · coronal · 3.0mm · 0.35mm/px · 2 of 30 slices shown]
[im 1/30]
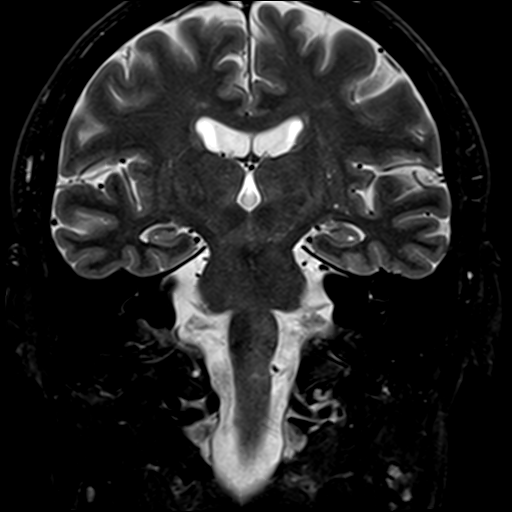
[im 30/30]
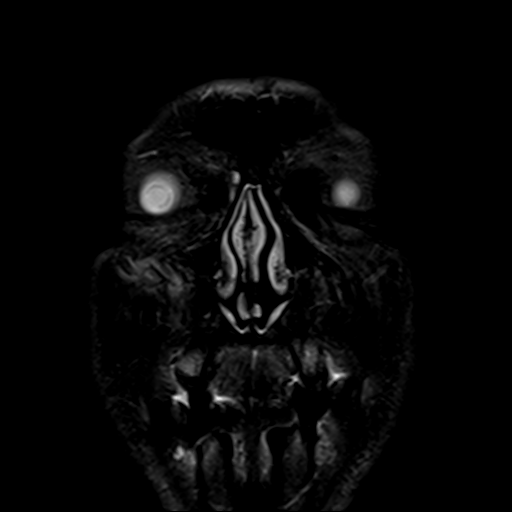

[Series 11: t1_mpr_tra · axial · 1.1mm · 0.72mm/px · z∈[-73,+80]mm · 8 of 144 slices shown]
[im 1/144]
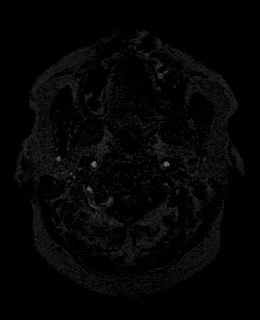
[im 29/144]
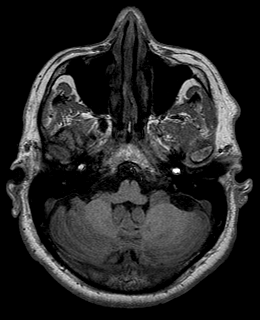
[im 43/144]
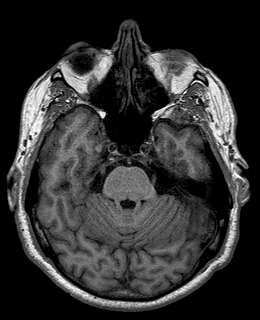
[im 58/144]
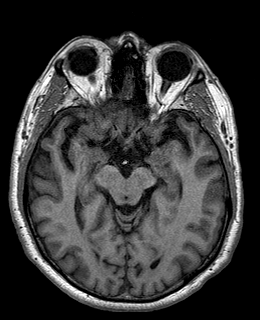
[im 86/144]
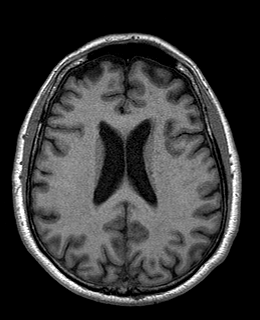
[im 101/144]
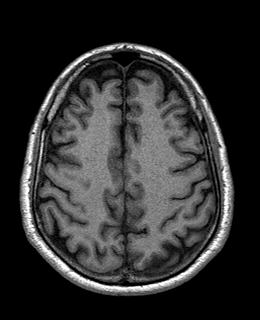
[im 115/144]
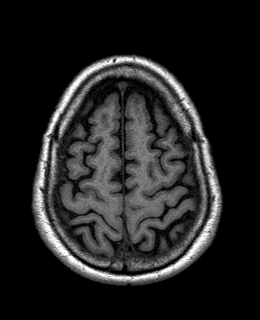
[im 144/144]
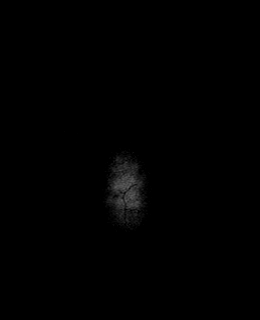

[Series 12: t2_ax_fs_3mm · axial · 3.0mm · 0.35mm/px · z∈[-68,+9]mm · 2 of 27 slices shown]
[im 1/27]
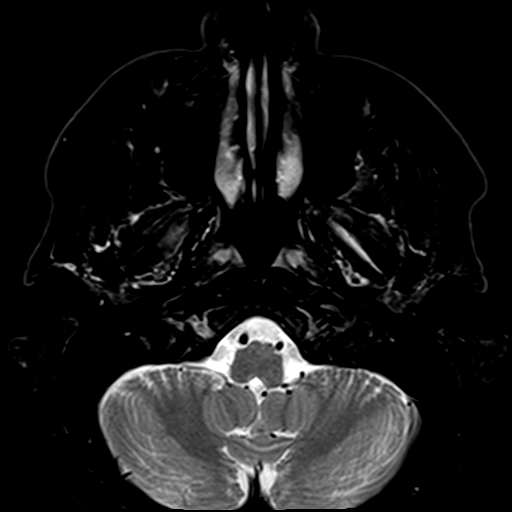
[im 27/27]
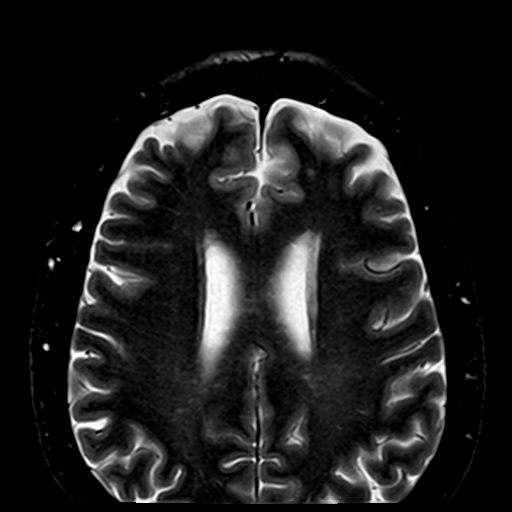

[Series 13: post cor · coronal · 5.0mm · 0.45mm/px · 2 of 30 slices shown]
[im 1/30]
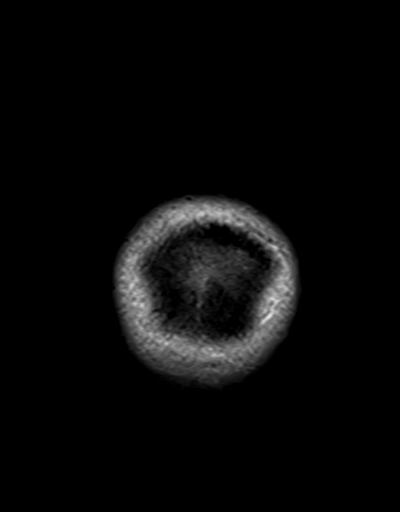
[im 30/30]
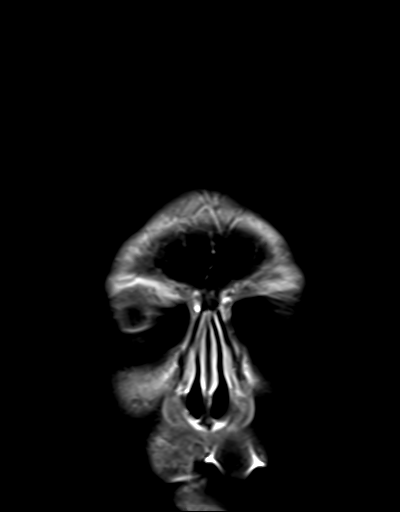

[Series 14: post t1_mpr_tra · axial · 1.1mm · 0.72mm/px · z∈[-73,+34]mm · 6 of 144 slices shown]
[im 1/144]
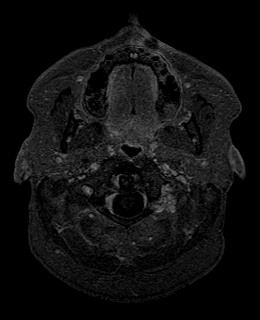
[im 29/144]
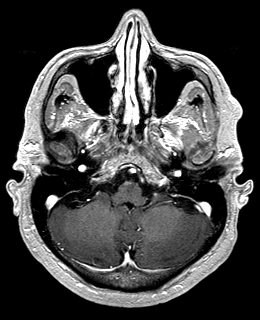
[im 43/144]
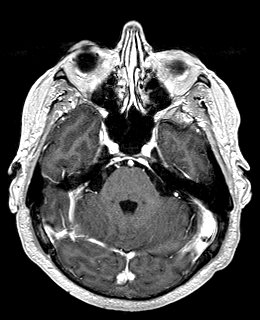
[im 58/144]
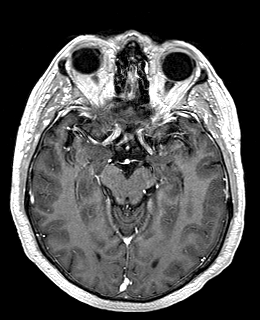
[im 86/144]
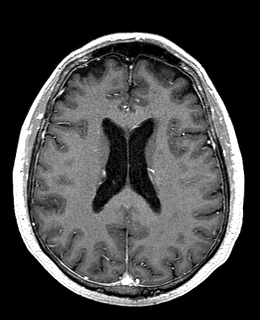
[im 101/144]
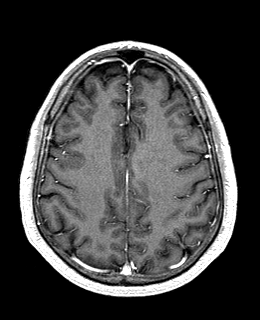

[40 of 48 positions shown; findings below may reference images not displayed]

FINDINGS: Brain: Diffusion imaging does not show any acute or subacute
infarction. Brainstem and cerebellum are normal. Cerebral
hemispheres show scattered small foci of T2 and FLAIR signal within
the white matter consistent with mild small vessel ischemic change,
often seen at this age. No cortical or large vessel territory
infarction. No abnormality seen in the inferior frontal regions.
First cranial nerves appear within normal limits. No lesion of
either olfactory recess. No abnormal contrast enhancement occurs. In
general, there is no hydrocephalus or extra-axial collection. No
brain mass lesion. No abnormal enhancement in any location.

Vascular: Major vessels at the base of the brain show flow.

Skull and upper cervical spine: Normal

Sinuses/Orbits: Paranasal sinuses appear clear. Orbits appear
normal.

Other: None
IMPRESSION: No cause of the presenting symptoms is identified.

Mild small vessel change of the cerebral hemispheric white matter,
frequently seen at this age.

## 2020-01-09 DIAGNOSIS — K2930 Chronic superficial gastritis without bleeding: Secondary | ICD-10-CM | POA: Diagnosis not present

## 2020-01-09 DIAGNOSIS — K2100 Gastro-esophageal reflux disease with esophagitis, without bleeding: Secondary | ICD-10-CM | POA: Diagnosis not present

## 2020-02-18 ENCOUNTER — Ambulatory Visit: Payer: Medicare Other | Admitting: Internal Medicine

## 2020-02-25 ENCOUNTER — Encounter: Payer: Self-pay | Admitting: Internal Medicine

## 2020-02-25 ENCOUNTER — Other Ambulatory Visit: Payer: Self-pay

## 2020-02-25 ENCOUNTER — Ambulatory Visit (INDEPENDENT_AMBULATORY_CARE_PROVIDER_SITE_OTHER): Payer: Medicare Other | Admitting: Internal Medicine

## 2020-02-25 DIAGNOSIS — G4733 Obstructive sleep apnea (adult) (pediatric): Secondary | ICD-10-CM

## 2020-02-25 DIAGNOSIS — Z77090 Contact with and (suspected) exposure to asbestos: Secondary | ICD-10-CM

## 2020-02-25 NOTE — Progress Notes (Signed)
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 PCP concerned about BP. Discussed relationship of BP to OSA.   Observations/Objective: No download this televist  Assessment and Plan: OSA- Compliance good and sleeping ok. - Plan: change auto range to 10-20, request download  Follow Up Instructions: 1 year    02/25/20- 69 year old male former smoker followed for OSA, Asbestos exposure, PE/ Eliquis 2019 , Urticaria, complicated by seasonal allergy, Glaucoma, GERD, bradycardia CPAP auto 7-20/ APS/ [facility] Download- compliance 97%, AHI 1.6/ hr Body weight today-  2 Phizer Covax He is very satisfied with CPAP. No concerns. Discussed comfort issues and reviewed download. Denies interval other health issues.   // Will need imaging update for hx asbestos exposure//  ROS-see HPI + = positive Constitutional:   No-   weight loss, night sweats, fevers, chills, fatigue, lassitude. HEENT:   +  headaches, +difficulty swallowing, tooth/dental problems, sore throat,       No-  sneezing, itching, ear ache, +nasal congestion, post nasal drip,  CV:  +  chest pain, orthopnea, PND, swelling in lower extremities, anasarca, dizziness,  palpitations Resp: No-   shortness of breath with exertion or at rest.              No-   productive cough,  No non-productive cough,  No- coughing up of blood.              No-   change in color of mucus.  No- wheezing.   Skin: No-   rash or lesions. GI:  + heartburn,+ indigestion,  No-abdominal pain, nausea, vomiting,  GU: . MS:    +joint pain or swelling.  . Neuro-     nothing unusual Psych:  No- change in mood or affect. No depression, + anxiety.  No memory loss.  OBJ- Physical Exam   + = positive General- Alert, Oriented, Affect-appropriate, Distress- none acute, + overweight. +Small mandible. Skin- rash-none, lesions- none, excoriation- none Lymphadenopathy- none Head- atraumatic            Eyes- Gross vision intact, PERRLA, conjunctivae and secretions clear            Ears- Hearing, canals-normal            Nose- Clear, no-Septal dev, mucus, polyps, erosion, perforation             Throat- Mallampati II , mucosa clear , drainage- none, tonsils- atrophic.                        Own teeth, + retrognathia Neck- flexible , trachea midline, no stridor , thyroid nl, carotid no bruit Chest - symmetrical excursion , unlabored           Heart/CV- RRR , no murmur , no gallop  , no rub, nl s1 s2                           -  JVD- none , edema- none, stasis changes- none, varices- none           Lung- clear to P&A/ no crackles heard, wheeze- none, cough- none , dullness-none, rub- none, rales-none           Chest wall-  Abd-  Br/ Gen/ Rectal- Not done, not indicated Extrem- cyanosis- none, clubbing, none, atrophy- none, strength- nl Neuro- grossly intact to observation

## 2020-02-25 NOTE — Patient Instructions (Signed)
We can continue CPAP auto 7-20  Please call if we can help

## 2020-02-27 ENCOUNTER — Encounter: Payer: Self-pay | Admitting: Internal Medicine

## 2020-02-27 DIAGNOSIS — M159 Polyosteoarthritis, unspecified: Secondary | ICD-10-CM | POA: Diagnosis not present

## 2020-02-27 DIAGNOSIS — Z7952 Long term (current) use of systemic steroids: Secondary | ICD-10-CM | POA: Diagnosis not present

## 2020-02-27 DIAGNOSIS — I2699 Other pulmonary embolism without acute cor pulmonale: Secondary | ICD-10-CM | POA: Diagnosis not present

## 2020-02-27 DIAGNOSIS — R7303 Prediabetes: Secondary | ICD-10-CM | POA: Diagnosis not present

## 2020-02-27 NOTE — Assessment & Plan Note (Signed)
Benefits with good compliance and control Plan- continue auto 7-20

## 2020-02-27 NOTE — Assessment & Plan Note (Signed)
No concerning symptoms currently Anticipate update CXR at upcoming visit

## 2020-04-07 DIAGNOSIS — Z23 Encounter for immunization: Secondary | ICD-10-CM | POA: Diagnosis not present

## 2020-04-30 ENCOUNTER — Other Ambulatory Visit: Payer: Self-pay | Admitting: Cardiology

## 2020-05-24 DIAGNOSIS — Z23 Encounter for immunization: Secondary | ICD-10-CM | POA: Diagnosis not present

## 2020-07-14 DIAGNOSIS — R972 Elevated prostate specific antigen [PSA]: Secondary | ICD-10-CM | POA: Diagnosis not present

## 2020-07-14 DIAGNOSIS — R7309 Other abnormal glucose: Secondary | ICD-10-CM | POA: Diagnosis not present

## 2020-07-14 DIAGNOSIS — Z79899 Other long term (current) drug therapy: Secondary | ICD-10-CM | POA: Diagnosis not present

## 2020-07-14 DIAGNOSIS — E7800 Pure hypercholesterolemia, unspecified: Secondary | ICD-10-CM | POA: Diagnosis not present

## 2020-07-14 DIAGNOSIS — Z0001 Encounter for general adult medical examination with abnormal findings: Secondary | ICD-10-CM | POA: Diagnosis not present

## 2020-07-15 DIAGNOSIS — Z86711 Personal history of pulmonary embolism: Secondary | ICD-10-CM | POA: Diagnosis not present

## 2020-07-15 DIAGNOSIS — G4700 Insomnia, unspecified: Secondary | ICD-10-CM | POA: Diagnosis not present

## 2020-07-15 DIAGNOSIS — E7800 Pure hypercholesterolemia, unspecified: Secondary | ICD-10-CM | POA: Diagnosis not present

## 2020-07-15 DIAGNOSIS — F419 Anxiety disorder, unspecified: Secondary | ICD-10-CM | POA: Diagnosis not present

## 2020-07-15 DIAGNOSIS — I10 Essential (primary) hypertension: Secondary | ICD-10-CM | POA: Diagnosis not present

## 2020-07-15 DIAGNOSIS — Z0001 Encounter for general adult medical examination with abnormal findings: Secondary | ICD-10-CM | POA: Diagnosis not present

## 2020-07-15 DIAGNOSIS — Z79899 Other long term (current) drug therapy: Secondary | ICD-10-CM | POA: Diagnosis not present

## 2020-09-01 DIAGNOSIS — Z7952 Long term (current) use of systemic steroids: Secondary | ICD-10-CM | POA: Diagnosis not present

## 2020-09-01 DIAGNOSIS — I2699 Other pulmonary embolism without acute cor pulmonale: Secondary | ICD-10-CM | POA: Diagnosis not present

## 2020-09-01 DIAGNOSIS — M159 Polyosteoarthritis, unspecified: Secondary | ICD-10-CM | POA: Diagnosis not present

## 2020-09-01 DIAGNOSIS — R7303 Prediabetes: Secondary | ICD-10-CM | POA: Diagnosis not present

## 2020-09-11 DIAGNOSIS — H524 Presbyopia: Secondary | ICD-10-CM | POA: Diagnosis not present

## 2020-09-11 DIAGNOSIS — H401331 Pigmentary glaucoma, bilateral, mild stage: Secondary | ICD-10-CM | POA: Diagnosis not present

## 2020-09-11 DIAGNOSIS — Z961 Presence of intraocular lens: Secondary | ICD-10-CM | POA: Diagnosis not present

## 2020-09-14 ENCOUNTER — Other Ambulatory Visit: Payer: Self-pay | Admitting: Cardiology

## 2020-09-22 ENCOUNTER — Ambulatory Visit (INDEPENDENT_AMBULATORY_CARE_PROVIDER_SITE_OTHER): Payer: Medicare Other | Admitting: Cardiology

## 2020-09-22 ENCOUNTER — Other Ambulatory Visit: Payer: Self-pay

## 2020-09-22 ENCOUNTER — Encounter: Payer: Self-pay | Admitting: Cardiology

## 2020-09-22 VITALS — BP 130/80 | HR 57 | Ht 67.0 in | Wt 195.0 lb

## 2020-09-22 DIAGNOSIS — R001 Bradycardia, unspecified: Secondary | ICD-10-CM | POA: Diagnosis not present

## 2020-09-22 DIAGNOSIS — R0600 Dyspnea, unspecified: Secondary | ICD-10-CM

## 2020-09-22 DIAGNOSIS — R0609 Other forms of dyspnea: Secondary | ICD-10-CM

## 2020-09-22 DIAGNOSIS — I10 Essential (primary) hypertension: Secondary | ICD-10-CM

## 2020-09-22 NOTE — Progress Notes (Signed)
Cardiology Office Note:    Date:  09/22/2020   ID:  Anthony [name], DOB Dec 07, 1950, MRN [id number]  PCP:  Anthony [name], Anthony [name]  Cardiologist:  Anthony Furbish, MD  Advanced Practice Provider:  No care team member to display Electrophysiologist:  None       Referring MD: Anthony Amel, MD     History of Present Illness:    Anthony [name] is a 70 y.o. male here for the follow-up of prior PE, sleep apnea, bradycardia.  Chronotropic competence noted on ZIO monitor in 2020.  Average heart rate was 63.  Overall been doing quite well.  No fevers chills nausea vomiting.  He does take Zyrtec for pressure rashes.  He also takes a very low-dose of prednisone for his arthritis.  Previously was having occasional dizzy spells when squatting to standing.  Prednisone being prescribed by rheumatology Zyrtec prescribed by dermatology Had rash with his BP cuff.  Overall spent 6 months with Eliquis, negative DVT negative hypercoagulable work-up no further recurrence.  Past Medical History:  Diagnosis Date  . 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 Depression   . GERD (gastroesophageal reflux disease)   . Glaucoma   . Headache(784.0)   . Obstructive sleep apnea 12/20/2012   NPSG 10/01/12- AHI 16.2/ hr, weight 172 lbs, mild limb jerks CPAP auto> 9 cwp [facility]   . Psoriasis   . Pulmonary embolism ([place]) 08/22/2017   Multiple pulmonary emboli identified on CTa chest 08/15/17 at [facility]. Report in Media.  Eliquis begun 08/2017  . Seasonal allergies   . Sleep apnea    using a cpap-moderate    Past Surgical History:  Procedure Laterality Date  . APPENDECTOMY    . CARPAL TUNNEL WITH CUBITAL TUNNEL  2008   right  . CARPAL TUNNEL WITH CUBITAL TUNNEL  2009   and ulnar nerve-left  . ESOPHAGEAL MANOMETRY N/A 10/25/2018   Procedure: ESOPHAGEAL  MANOMETRY (EM);  Surgeon: Anthony Corner, MD;  Location: [facility];  Service: Endoscopy;  Laterality: N/A;  . EXTERNAL EAR SURGERY     ears reset surgically age 27  . [place] IMPEDANCE STUDY N/A 10/25/2018   Procedure: [place] IMPEDANCE STUDY;  Surgeon: Anthony Corner, MD;  Location: [facility];  Service: Endoscopy;  Laterality: N/A;  . RETINAL DETACHMENT SURGERY  2004   right  . RETINAL DETACHMENT SURGERY  2002   left  . SHOULDER ARTHROSCOPY  2006   left  . TONSILLECTOMY    . TRIGGER FINGER RELEASE Left 02/15/2013   Procedure: RELEASE TRIGGER FINGER/A-1 PULLEY LEFT LONG FINGER;  Surgeon: Anthony [name]., MD;  Location: [place];  Service: Orthopedics;  Laterality: Left;  . TRIGGER FINGER RELEASE Left 09/06/2013   Procedure: RELEASE TRIGGER FINGER/A-1 PULLEY;  Surgeon: Anthony [name]., MD;  Location: [place];  Service: Orthopedics;  Laterality: Left;  . TRIGGER FINGER RELEASE Right 08/05/2014   Procedure: RIGHT SMALL TRIGGER RELEASE ;  Surgeon: Anthony Cover, MD;  Location: [place];  Service: Orthopedics;  Laterality: Right;  . ULNAR NERVE TRANSPOSITION Left 09/06/2013   Procedure: LEFT ANTERIOR SUBCUTANEOUS TRANSPOSITION;  Surgeon: Anthony [name]., MD;  Location: [place];  Service: Orthopedics;  Laterality: Left;    Current Medications: Current Meds  Medication Sig  . ALPRAZolam (XANAX) 0.5 MG tablet Take  0.5 mg by mouth at bedtime as needed for anxiety.  Anthony [name] amLODipine (NORVASC) 5 MG tablet TAKE 1 TABLET BY MOUTH EVERY DAY  . aspirin EC 81 MG tablet Take 81 mg by mouth daily.  Anthony [name] atorvastatin (LIPITOR) 20 MG tablet Take 20 mg by mouth daily.  . Clobetasol Propionate (TEMOVATE) 0.05 % external spray Apply topically 2 (two) times daily.  Anthony [name] EPINEPHrine 0.3 mg/0.3 mL IJ SOAJ injection   . escitalopram (LEXAPRO) 20 MG tablet Take 20 mg by mouth daily.  Anthony [name] latanoprost (XALATAN) 0.005 % ophthalmic solution Place 1  drop into both eyes at bedtime. 1 drop OU HS  . Multiple Vitamins-Minerals (PRESERVISION AREDS PO) Take 1 capsule by mouth daily.  . predniSONE (DELTASONE) 5 MG tablet 5 mg daily. Takes 5 mg total; 1/2 in AM & 1/2 in PM  . rosuvastatin (CRESTOR) 5 MG tablet Take 5 mg by mouth daily.  . tacrolimus (PROTOPIC) 0.1 % ointment Apply topically as needed.  Anthony [name] telmisartan (MICARDIS) 20 MG tablet Take 1 tablet (20 mg total) by mouth daily. Must keep upcoming appt for further refills.  . valACYclovir (VALTREX) 500 MG tablet as needed.     Allergies:   Augmentin [amoxicillin-pot clavulanate], Levaquin [levofloxacin], Nsaids, Suvorexant, Tolmetin, and Trazodone   Social History   Socioeconomic History  . Marital status: Married    Spouse name: Not on file  . Number of children: Not on file  . Years of education: Not on file  . Highest education level: Not on file  Occupational History  . Not on file  Tobacco Use  . Smoking status: Former Smoker    Packs/day: 1.00    Years: 5.00    Pack years: 5.00    Types: Cigarettes    Quit date: 08/09/1970    Years since quitting: 50.1  . Smokeless tobacco: Never Used  Substance and Sexual Activity  . Alcohol use: Yes    Comment: rare  . Drug use: Not on file  . Sexual activity: Not on file  Other Topics Concern  . Not on file  Social History Narrative  . Not on file   Social Determinants of Health   Financial Resource Strain: Not on file  Food Insecurity: Not on file  Transportation Needs: Not on file  Physical Activity: Not on file  Stress: Not on file  Social Connections: Not on file     EKGs/Labs/Other Studies Reviewed:    The following studies were reviewed today:  Echocardiogram-EF 28% grade 2 diastolic dysfunction  EKG:  EKG is  ordered today.  The ekg ordered today demonstrates sinus bradycardia 57 no other abnormalities, prior EKG sinus rhythm 60 with no other abnormalities  Recent Labs: No results found for requested labs  within last 8760 hours.  Recent Lipid Panel No results found for: CHOL, TRIG, HDL, CHOLHDL, VLDL, LDLCALC, LDLDIRECT   Risk Assessment/Calculations:      Physical Exam:    VS:  BP 130/80 (BP Location: Left Arm, Patient Position: Sitting, Cuff Size: Normal)   Pulse (!) 57   Ht 5\' 7"  (1.702 m)   Wt 195 lb (88.5 kg)   SpO2 97%   BMI 30.54 kg/m     Wt Readings from Last 3 Encounters:  09/22/20 195 lb (88.5 kg)  02/25/20 194 lb 9.6 oz (88.3 kg)  09/19/19 193 lb (87.5 kg)     GEN: Well nourished, well developed in no acute distress HEENT: Normal NECK: No JVD; No carotid bruits LYMPHATICS: No lymphadenopathy CARDIAC:  Bradycardic regular, no murmurs, rubs, gallops RESPIRATORY:  Clear to auscultation without rales, wheezing or rhonchi  ABDOMEN: Soft, non-tender, non-distended MUSCULOSKELETAL:  No edema; No deformity  SKIN: Warm and dry NEUROLOGIC:  Alert and oriented x 3 PSYCHIATRIC:  Normal affect   ASSESSMENT:    1. Bradycardia   2. Essential hypertension   3. DOE (dyspnea on exertion)    PLAN:    In order of problems listed above:  Bradycardia -Prior history ranging from 50s to 90s usual for him.  Sounds more orthostatic when he explains his dizziness.  Continue to hydrate well.  Monitor has been reassuring.  Avoid AV nodal blocking agents.  Prior dyspnea on exertion -Continue exercising. Yawn Sigh periodically.  Overall been doing quite well.  This has improved.  Essential hypertension -Exercise, stable medications.  No changes made.  Prior rash, followed by dermatology. Zyrtec, pressure hives.  LDL 110 hemoglobin A1c 6.2 hemoglobin 14.4 creatinine 1.03      Medication Adjustments/Labs and Tests Ordered: Current medicines are reviewed at length with the patient today.  Concerns regarding medicines are outlined above.  Orders Placed This Encounter  Procedures  . EKG 12-Lead   No orders of the defined types were placed in this encounter.   Patient  Instructions  Medication Instructions:  The current medical regimen is effective;  continue present plan and medications.  *If you need a refill on your cardiac medications before your next appointment, please call your pharmacy*  Follow-Up: At [facility], you and your health needs are our priority.  As part of our continuing mission to provide you with exceptional heart care, we have created designated Provider Care Teams.  These Care Teams include your primary Cardiologist (physician) and Advanced Practice Providers (APPs -  Physician Assistants and Nurse Practitioners) who all work together to provide you with the care you need, when you need it.  We recommend signing up for the patient portal called "MyChart".  Sign up information is provided on this After Visit Summary.  MyChart is used to connect with patients for Virtual Visits (Telemedicine).  Patients are able to view lab/test results, encounter notes, upcoming appointments, etc.  Non-urgent messages can be sent to your provider as well.   To learn more about what you can do with MyChart, go to [email or web address].    Your next appointment:   12 month(s)  The format for your next appointment:   In Person  Provider:   Candee Furbish, MD   Thank you for choosing [facility]!!        Signed, Anthony Furbish, MD  09/22/2020 4:46 PM    [place]

## 2020-09-22 NOTE — Patient Instructions (Signed)
Medication Instructions:  The current medical regimen is effective;  continue present plan and medications.  *If you need a refill on your cardiac medications before your next appointment, please call your pharmacy*  Follow-Up: At [facility], you and your health needs are our priority.  As part of our continuing mission to provide you with exceptional heart care, we have created designated Provider Care Teams.  These Care Teams include your primary Cardiologist (physician) and Advanced Practice Providers (APPs -  Physician Assistants and Nurse Practitioners) who all work together to provide you with the care you need, when you need it.  We recommend signing up for the patient portal called "MyChart".  Sign up information is provided on this After Visit Summary.  MyChart is used to connect with patients for Virtual Visits (Telemedicine).  Patients are able to view lab/test results, encounter notes, upcoming appointments, etc.  Non-urgent messages can be sent to your provider as well.   To learn more about what you can do with MyChart, go to [email or web address].    Your next appointment:   12 month(s)  The format for your next appointment:   In Person  Provider:   Mark Skains, MD   Thank you for choosing [facility]!!      

## 2020-09-24 DIAGNOSIS — N401 Enlarged prostate with lower urinary tract symptoms: Secondary | ICD-10-CM | POA: Diagnosis not present

## 2020-09-24 DIAGNOSIS — R3915 Urgency of urination: Secondary | ICD-10-CM | POA: Diagnosis not present

## 2020-10-22 ENCOUNTER — Other Ambulatory Visit: Payer: Self-pay | Admitting: Cardiology

## 2020-12-30 DIAGNOSIS — H401331 Pigmentary glaucoma, bilateral, mild stage: Secondary | ICD-10-CM | POA: Diagnosis not present

## 2020-12-31 ENCOUNTER — Telehealth: Payer: Self-pay | Admitting: Internal Medicine

## 2020-12-31 DIAGNOSIS — G4733 Obstructive sleep apnea (adult) (pediatric): Secondary | ICD-10-CM

## 2020-12-31 NOTE — Telephone Encounter (Signed)
Order- DME [facility]/ [facility]- please replace old CPAP machine ( screen warning motor life exceeded)  auto 10-20, mask of choice, humidifier, supplies. Please install AirView/ card

## 2020-12-31 NOTE — Telephone Encounter (Signed)
Called and spoke with patient wife, Anthony [name] per [place] and updated her on Dr Janee Morn response and recommendations for new CPAP. Anthony [name] expressed full understanding and agreeable with order being placed. Order placed per Dr Annamaria Boots. Provided Anthony [name] with phone number to [facility]. Nothing further needed at this time.

## 2020-12-31 NOTE — Telephone Encounter (Signed)
Called and spoke with Patient's wife, Anthony [name] ([place]). Anthony [name] stated Patient cpap is reading "blowing life is exceeded".  Anthony [name] stated machine is over 71 years old and is requesting a new cpap machine. Anthony [name] stated she has spoke with a [facility] rep and a cpap specialist is suppose to call her back sometime. Anthony [name] stated they are going to [facility] Saturday, and was hoping they could get a cpap by then.  Explained Cpap's are on back order and I was unsure how long it may take for Patient to receive his.  Understanding stated.  Message routed to Dr. Annamaria Boots to advise  Allergies  Allergen Reactions  . Augmentin [Amoxicillin-Pot Clavulanate]     Severe rash  . Levaquin [Levofloxacin]     Muscle tightness-told to never take it again  . Nsaids     Gi upset  . Suvorexant     Other reaction(s): tongue,lip, face swelling  . Tolmetin Nausea Only    Gi upset  . Trazodone     Other reaction(s): prolonged erection   Current Outpatient Medications on File Prior to Visit  Medication Sig Dispense Refill  . ALPRAZolam (XANAX) 0.5 MG tablet Take 0.5 mg by mouth at bedtime as needed for anxiety.    Marland Kitchen amLODipine (NORVASC) 5 MG tablet TAKE 1 TABLET BY MOUTH EVERY DAY 90 tablet 3  . aspirin EC 81 MG tablet Take 81 mg by mouth daily.    Marland Kitchen atorvastatin (LIPITOR) 20 MG tablet Take 20 mg by mouth daily.    . Clobetasol Propionate (TEMOVATE) 0.05 % external spray Apply topically 2 (two) times daily.    Marland Kitchen EPINEPHrine 0.3 mg/0.3 mL IJ SOAJ injection     . escitalopram (LEXAPRO) 20 MG tablet Take 20 mg by mouth daily.    Marland Kitchen latanoprost (XALATAN) 0.005 % ophthalmic solution Place 1 drop into both eyes at bedtime. 1 drop OU HS    . Multiple Vitamins-Minerals (PRESERVISION AREDS PO) Take 1 capsule by mouth daily.    . predniSONE (DELTASONE) 5 MG tablet 5 mg daily. Takes 5 mg total; 1/2 in AM & 1/2 in PM    . rosuvastatin (CRESTOR) 5 MG tablet Take 5 mg by mouth daily.    . tacrolimus (PROTOPIC) 0.1 % ointment Apply  topically as needed.    Marland Kitchen telmisartan (MICARDIS) 20 MG tablet Take 1 tablet (20 mg total) by mouth daily. Must keep upcoming appt for further refills. 90 tablet 0  . valACYclovir (VALTREX) 500 MG tablet as needed.     No current facility-administered medications on file prior to visit.

## 2021-01-02 ENCOUNTER — Telehealth: Payer: Self-pay | Admitting: Internal Medicine

## 2021-01-02 NOTE — Telephone Encounter (Signed)
Called and spoke with Thayer Headings ([place]).  Thayer Headings stated she called [facility] and was told no cpap orders have been received on Patient.  Advised Dr. Annamaria Boots ordered new cpap 12/31/20 and cpap's are on back order. Thayer Headings is asking order be resent to [place].   Message routed to [facility]' s to assist

## 2021-01-06 NOTE — Telephone Encounter (Signed)
I sent order to [facility] on 5/26.  I called them and spoke to [facility].  She verified they have order.  She checked and they have all that is needed except for documentation pt has been seen within 6 months and benefiting from cpap.  I told her pt has appt in July.  She is making note on order of that appt.  I called pt & explained to him order is at [place] and gave him their phone #.  Explained what Nira Conn stated and he said ok.  Nothing further needed.

## 2021-02-10 DIAGNOSIS — L03012 Cellulitis of left finger: Secondary | ICD-10-CM | POA: Diagnosis not present

## 2021-02-24 ENCOUNTER — Ambulatory Visit: Payer: Medicare Other | Admitting: Internal Medicine

## 2021-02-28 ENCOUNTER — Other Ambulatory Visit: Payer: Self-pay | Admitting: Cardiology

## 2021-03-02 DIAGNOSIS — R091 Pleurisy: Secondary | ICD-10-CM | POA: Diagnosis not present

## 2021-03-02 DIAGNOSIS — R079 Chest pain, unspecified: Secondary | ICD-10-CM | POA: Diagnosis not present

## 2021-03-02 DIAGNOSIS — J029 Acute pharyngitis, unspecified: Secondary | ICD-10-CM | POA: Diagnosis not present

## 2021-03-02 DIAGNOSIS — I771 Stricture of artery: Secondary | ICD-10-CM | POA: Diagnosis not present

## 2021-03-02 DIAGNOSIS — R0789 Other chest pain: Secondary | ICD-10-CM | POA: Diagnosis not present

## 2021-03-05 NOTE — Progress Notes (Signed)
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 PCP concerned about BP. Discussed relationship of BP to OSA.   Observations/Objective: No download this televist  Assessment and Plan: OSA- Compliance good and sleeping ok. - Plan: change auto range to 10-20, request download  Follow Up Instructions: 1 year    02/25/20- 70 year old male former smoker followed for OSA, Asbestos exposure, PE/ Eliquis 2019 , Urticaria, complicated by seasonal allergy, Glaucoma, GERD, bradycardia CPAP auto 7-20/ [facility] Download- compliance 97%, AHI 1.6/ hr Body weight today-  2 Phizer Covax He is very satisfied with CPAP. No concerns. Discussed comfort issues and reviewed download. Denies interval other health issues.   03/06/21- 70 year old male former smoker followed for OSA, Asbestos exposure, PE/ Eliquis 2019 , Urticaria, complicated by seasonal allergy, Glaucoma, GERD, bradycardia CPAP auto 7-20/ [facility] Download- compliance 99%, AHI 1.5/ hr Body weight today-191 lbs Covid vax- Doing well with CPAP. Expecting delivery of replacement machine. Current machine still works well.  Asbestos exposure- had R lat chest pain lasting 2 days. Had  CT chest and labs including D-dimer at [facility] last week. He says neg for PE and treated for pleurisy with prednisone.Pain resolved. He wasn't told of other findings of concern. He will get [place] report.   ROS-see HPI + = positive Constitutional:   No-   weight loss, night sweats, fevers, chills, fatigue, lassitude. HEENT:   +  headaches, +difficulty swallowing, tooth/dental problems, sore throat,       No-  sneezing, itching, ear ache, +nasal congestion, post nasal drip,  CV:  +  chest pain, orthopnea, PND, swelling in lower extremities, anasarca, dizziness, palpitations Resp: No-   shortness of breath with exertion or at rest.              No-   productive cough,  No non-productive cough,  No- coughing up of blood.              No-   change in color of mucus.  No- wheezing.   Skin: No-   rash or lesions. GI:  + heartburn,+ indigestion,  No-abdominal pain, nausea, vomiting,  GU: . MS:    +joint pain or swelling.  . Neuro-     nothing unusual Psych:  No- change in mood or affect. No depression, + anxiety.  No memory loss.  OBJ- Physical Exam   + = positive General- Alert, Oriented, Affect-appropriate, Distress- none acute, + overweight. +Small mandible. Skin- rash-none, lesions- none, excoriation- none Lymphadenopathy- none Head- atraumatic            Eyes- Gross vision intact, PERRLA, conjunctivae and secretions clear            Ears- Hearing, canals-normal            Nose- Clear, no-Septal dev, mucus, polyps, erosion, perforation  Throat- Mallampati II , mucosa clear , drainage- none, tonsils- atrophic.                        Own teeth, + retrognathia Neck- flexible , trachea midline, no stridor , thyroid nl, carotid no bruit Chest - symmetrical excursion , unlabored           Heart/CV- RRR , no murmur , no gallop  , no rub, nl s1 s2                           - JVD- none , edema- none, stasis changes- none, varices- none           Lung- clear to P&A/ no crackles heard,  wheeze- none, cough- none , dullness-none, rub- none, rales-none           Chest wall-  Abd-  Br/ Gen/ Rectal- Not done, not indicated Extrem- cyanosis- none, clubbing, none, atrophy- none, strength- nl Neuro- grossly intact to observation

## 2021-03-06 ENCOUNTER — Other Ambulatory Visit: Payer: Self-pay

## 2021-03-06 ENCOUNTER — Ambulatory Visit (INDEPENDENT_AMBULATORY_CARE_PROVIDER_SITE_OTHER): Payer: Medicare Other | Admitting: Internal Medicine

## 2021-03-06 ENCOUNTER — Encounter: Payer: Self-pay | Admitting: Internal Medicine

## 2021-03-06 VITALS — BP 128/88 | HR 64 | Ht 67.0 in | Wt 191.4 lb

## 2021-03-06 DIAGNOSIS — Z77090 Contact with and (suspected) exposure to asbestos: Secondary | ICD-10-CM | POA: Diagnosis not present

## 2021-03-06 DIAGNOSIS — G4733 Obstructive sleep apnea (adult) (pediatric): Secondary | ICD-10-CM | POA: Diagnosis not present

## 2021-03-06 DIAGNOSIS — G4700 Insomnia, unspecified: Secondary | ICD-10-CM | POA: Diagnosis not present

## 2021-03-06 DIAGNOSIS — I2602 Saddle embolus of pulmonary artery with acute cor pulmonale: Secondary | ICD-10-CM

## 2021-03-06 DIAGNOSIS — R7309 Other abnormal glucose: Secondary | ICD-10-CM | POA: Diagnosis not present

## 2021-03-06 DIAGNOSIS — F419 Anxiety disorder, unspecified: Secondary | ICD-10-CM | POA: Diagnosis not present

## 2021-03-06 NOTE — Assessment & Plan Note (Addendum)
Benefits with good compliance and control. Pending replacement machine.

## 2021-03-06 NOTE — Patient Instructions (Signed)
Order- DME [facility]- please replace old CPAP machine auto 7-20, mask of choice, humidifier, supplies, airview/ card  Please get [place] a copy of your recent CT scan report if you can. That will help [place] watch out for asbestos related issues as well.  Please call if we can help

## 2021-03-06 NOTE — Assessment & Plan Note (Signed)
CT done July, 2022 at [facility]. No concern reported to him.  Plan- he is getting [place] report

## 2021-03-06 NOTE — Assessment & Plan Note (Signed)
Recent negative work-up after recent R pleuritic pain.

## 2021-04-01 DIAGNOSIS — L603 Nail dystrophy: Secondary | ICD-10-CM | POA: Diagnosis not present

## 2021-04-07 DIAGNOSIS — H401331 Pigmentary glaucoma, bilateral, mild stage: Secondary | ICD-10-CM | POA: Diagnosis not present

## 2021-04-15 ENCOUNTER — Other Ambulatory Visit: Payer: Self-pay | Admitting: Orthopedic Surgery

## 2021-04-15 ENCOUNTER — Telehealth: Payer: Self-pay | Admitting: *Deleted

## 2021-04-15 DIAGNOSIS — L608 Other nail disorders: Secondary | ICD-10-CM | POA: Diagnosis not present

## 2021-04-15 DIAGNOSIS — M71341 Other bursal cyst, right hand: Secondary | ICD-10-CM | POA: Diagnosis not present

## 2021-04-15 DIAGNOSIS — Z808 Family history of malignant neoplasm of other organs or systems: Secondary | ICD-10-CM | POA: Diagnosis not present

## 2021-04-15 NOTE — Telephone Encounter (Signed)
   [facility] Pre-operative Risk Assessment    Patient Name: Anthony [name]  DOB: April 14, 1951 MRN: [id number]  HEARTCARE STAFF:  - IMPORTANT!!!!!! Under Visit Info/Reason for Call, type in Other and utilize the format Clearance MM/DD/YY or Clearance TBD. Do not use dashes or single digits. - Please review there is not already an duplicate clearance open for this procedure. - If request is for dental extraction, please clarify the # of teeth to be extracted. - If the patient is currently at the dentist's office, call Pre-Op Callback Staff (MA/nurse) to input urgent request.  - If the patient is not currently in the dentist office, please route to the Pre-Op pool.  Request for surgical clearance:  What type of surgery is being performed? RIGHT LONG FINGER NAIL UNIT Bx  When is this surgery scheduled? 05/08/21  What type of clearance is required (medical clearance vs. Pharmacy clearance to hold med vs. Both)? MEDICAL  Are there any medications that need to be held prior to surgery and how long? ASA  Practice name and name of physician performing surgery? [facility]; DR. Leanora Cover  What is the office phone number? [phone]   7.   What is the office fax number? [facility].   Anesthesia type (None, local, MAC, general) ? CHOICE   Julaine Hua 04/15/2021, 4:42 PM  _________________________________________________________________   (provider comments below)

## 2021-04-16 DIAGNOSIS — Z23 Encounter for immunization: Secondary | ICD-10-CM | POA: Diagnosis not present

## 2021-04-16 NOTE — Telephone Encounter (Addendum)
Patient was last seen on 09/22/20 by Dr. Marlou Porch. He has no history of ischemic heart disease and has discontinued [place] for hx of PE.   Left VM

## 2021-04-20 DIAGNOSIS — Z7952 Long term (current) use of systemic steroids: Secondary | ICD-10-CM | POA: Diagnosis not present

## 2021-04-20 DIAGNOSIS — M159 Polyosteoarthritis, unspecified: Secondary | ICD-10-CM | POA: Diagnosis not present

## 2021-04-20 DIAGNOSIS — M353 Polymyalgia rheumatica: Secondary | ICD-10-CM | POA: Diagnosis not present

## 2021-04-28 NOTE — Telephone Encounter (Signed)
   Name: Anthony [name]  DOB: [birth year]  MRN: [id number]   Primary Cardiologist: Candee Furbish, MD  Chart reviewed as part of pre-operative protocol coverage. Patient was contacted 04/28/2021 in reference to pre-operative risk assessment for pending surgery as outlined below.  Anthony [name] was last seen on 09/2020 by Dr. Marlou Porch. I reached out to patient for update on how he is doing. The patient affirms he has been doing well without any new cardiac symptoms. Therefore, based on ACC/AHA guidelines, the patient would be at acceptable risk for the planned procedure without further cardiovascular testing. The patient was advised that if he develops new symptoms prior to surgery to contact our office to arrange for a follow-up visit, and he verbalized understanding. Given no history of MI, PCI, stroke or CABG, Ok to hold ASA prior to surgery. Will defer duration to surgeon, but OK to hold for 7 days if needed.   I will route this recommendation to the requesting party via Epic fax function and remove from pre-op pool. Please call with questions.  Charlie Pitter, PA-C 04/28/2021, 12:39 PM

## 2021-05-01 ENCOUNTER — Encounter (HOSPITAL_BASED_OUTPATIENT_CLINIC_OR_DEPARTMENT_OTHER): Payer: Self-pay | Admitting: Orthopedic Surgery

## 2021-05-01 ENCOUNTER — Other Ambulatory Visit: Payer: Self-pay

## 2021-05-07 NOTE — Anesthesia Preprocedure Evaluation (Addendum)
Anesthesia Evaluation  Patient identified by MRN, date of birth, ID band Patient awake    Reviewed: Allergy & Precautions, NPO status , Patient's Chart, lab work & pertinent test results  Airway Mallampati: II  TM Distance: >3 FB Neck ROM: Full    Dental no notable dental hx. (+) Teeth Intact, Dental Advisory Given   Pulmonary sleep apnea and Continuous Positive Airway Pressure Ventilation , former smoker,  Hx of asbestos exposure   Pulmonary exam normal breath sounds clear to auscultation       Cardiovascular hypertension, Pt. on medications Normal cardiovascular exam Rhythm:Regular Rate:Normal  05/2019 TTE 1. Left ventricular ejection fraction, by visual estimation, is 65 to  70%. The left ventricle has hyperdynamic function. There is no left  ventricular hypertrophy.  2. Left ventricular diastolic parameters are consistent with Grade II  diastolic dysfunction (pseudonormalization).  3. Global right ventricle has normal systolic function.The right  ventricular size is normal. No increase in right ventricular wall  thickness.  4. Left atrial size was normal.  5. Right atrial size was normal.  6. The mitral valve is normal in structure. No evidence of mitral valve  regurgitation. No evidence of mitral stenosis.  7. The tricuspid valve is normal in structure. Tricuspid valve  regurgitation is not demonstrated.  8. The aortic valve is normal in structure. Aortic valve regurgitation is  not visualized. No evidence of aortic valve sclerosis or stenosis.  9. The pulmonic valve was normal in structure. Pulmonic valve  regurgitation is not visualized.  10. Normal pulmonary artery systolic pressure.  11. The atrial septum is grossly normal.    Neuro/Psych  Headaches, PSYCHIATRIC DISORDERS Depression    GI/Hepatic Neg liver ROS, GERD  ,  Endo/Other  negative endocrine ROS  Renal/GU negative Renal ROS      Musculoskeletal negative musculoskeletal ROS (+)   Abdominal (+) + obese (BMI 30.54),   Peds  Hematology negative hematology ROS (+)   Anesthesia Other Findings   Reproductive/Obstetrics                            Anesthesia Physical Anesthesia Plan  ASA: 3  Anesthesia Plan: Regional   Post-op Pain Management:    Induction:   PONV Risk Score and Plan: 2 and Treatment may vary due to age or medical condition and Midazolam  Airway Management Planned: Natural Airway and Nasal Cannula  Additional Equipment: None  Intra-op Plan:   Post-operative Plan:   Informed Consent: I have reviewed the patients History and Physical, chart, labs and discussed the procedure including the risks, benefits and alternatives for the proposed anesthesia with the patient or authorized representative who has indicated his/her understanding and acceptance.     Dental advisory given  Plan Discussed with:   Anesthesia Plan Comments: (R SupraCL + mac)       Anesthesia Quick Evaluation

## 2021-05-08 ENCOUNTER — Encounter (HOSPITAL_BASED_OUTPATIENT_CLINIC_OR_DEPARTMENT_OTHER)
Admission: RE | Disposition: A | Discharge status: Home / Self Care | Payer: Self-pay | Source: Home / Self Care | Attending: Orthopedic Surgery

## 2021-05-08 ENCOUNTER — Ambulatory Visit (HOSPITAL_BASED_OUTPATIENT_CLINIC_OR_DEPARTMENT_OTHER): Payer: Medicare Other | Admitting: Anesthesiology

## 2021-05-08 ENCOUNTER — Ambulatory Visit (HOSPITAL_BASED_OUTPATIENT_CLINIC_OR_DEPARTMENT_OTHER)
Admission: RE | Admit: 2021-05-08 | Discharge: 2021-05-08 | Disposition: A | Discharge status: Home / Self Care | Payer: Medicare Other | Attending: Orthopedic Surgery | Admitting: Orthopedic Surgery

## 2021-05-08 ENCOUNTER — Encounter (HOSPITAL_BASED_OUTPATIENT_CLINIC_OR_DEPARTMENT_OTHER): Payer: Self-pay | Admitting: Orthopedic Surgery

## 2021-05-08 ENCOUNTER — Other Ambulatory Visit: Payer: Self-pay

## 2021-05-08 DIAGNOSIS — Z86711 Personal history of pulmonary embolism: Secondary | ICD-10-CM | POA: Insufficient documentation

## 2021-05-08 DIAGNOSIS — Z881 Allergy status to other antibiotic agents status: Secondary | ICD-10-CM | POA: Diagnosis not present

## 2021-05-08 DIAGNOSIS — Z880 Allergy status to penicillin: Secondary | ICD-10-CM | POA: Insufficient documentation

## 2021-05-08 DIAGNOSIS — Z87891 Personal history of nicotine dependence: Secondary | ICD-10-CM | POA: Insufficient documentation

## 2021-05-08 DIAGNOSIS — E669 Obesity, unspecified: Secondary | ICD-10-CM | POA: Diagnosis not present

## 2021-05-08 DIAGNOSIS — Z77090 Contact with and (suspected) exposure to asbestos: Secondary | ICD-10-CM | POA: Insufficient documentation

## 2021-05-08 DIAGNOSIS — L608 Other nail disorders: Secondary | ICD-10-CM | POA: Diagnosis not present

## 2021-05-08 DIAGNOSIS — L814 Other melanin hyperpigmentation: Secondary | ICD-10-CM | POA: Diagnosis not present

## 2021-05-08 DIAGNOSIS — G4733 Obstructive sleep apnea (adult) (pediatric): Secondary | ICD-10-CM | POA: Diagnosis not present

## 2021-05-08 DIAGNOSIS — J302 Other seasonal allergic rhinitis: Secondary | ICD-10-CM | POA: Diagnosis not present

## 2021-05-08 DIAGNOSIS — Z9989 Dependence on other enabling machines and devices: Secondary | ICD-10-CM | POA: Diagnosis not present

## 2021-05-08 DIAGNOSIS — Z886 Allergy status to analgesic agent status: Secondary | ICD-10-CM | POA: Insufficient documentation

## 2021-05-08 DIAGNOSIS — Z6829 Body mass index (BMI) 29.0-29.9, adult: Secondary | ICD-10-CM | POA: Insufficient documentation

## 2021-05-08 DIAGNOSIS — Z888 Allergy status to other drugs, medicaments and biological substances status: Secondary | ICD-10-CM | POA: Diagnosis not present

## 2021-05-08 HISTORY — PX: NAILBED REPAIR: SHX5028

## 2021-05-08 HISTORY — DX: Essential (primary) hypertension: I10

## 2021-05-08 SURGERY — REPAIR, NAIL BED
Anesthesia: Regional | Site: Finger | Laterality: Right

## 2021-05-08 MED ORDER — CLINDAMYCIN PHOSPHATE 900 MG/50ML IV SOLN
INTRAVENOUS | Status: AC
  Filled 2021-05-08: qty 50

## 2021-05-08 MED ORDER — MIDAZOLAM HCL 2 MG/2ML IJ SOLN
1.0000 mg | Freq: Once | INTRAMUSCULAR | Status: AC
  Administered 2021-05-08: 1 mg via INTRAVENOUS

## 2021-05-08 MED ORDER — HYDROCODONE-ACETAMINOPHEN 5-325 MG PO TABS: ORAL_TABLET | ORAL | 0 refills | Status: DC

## 2021-05-08 MED ORDER — LACTATED RINGERS IV SOLN: INTRAVENOUS | Status: DC

## 2021-05-08 MED ORDER — FENTANYL CITRATE (PF) 100 MCG/2ML IJ SOLN: 25.0000 ug | INTRAMUSCULAR | Status: DC | PRN

## 2021-05-08 MED ORDER — BUPIVACAINE HCL (PF) 0.5 % IJ SOLN
INTRAMUSCULAR | Status: DC | PRN
  Administered 2021-05-08: 20 mL via PERINEURAL

## 2021-05-08 MED ORDER — FENTANYL CITRATE (PF) 100 MCG/2ML IJ SOLN
50.0000 ug | Freq: Once | INTRAMUSCULAR | Status: AC
  Administered 2021-05-08: 50 ug via INTRAVENOUS

## 2021-05-08 MED ORDER — ACETAMINOPHEN 10 MG/ML IV SOLN: 1000.0000 mg | Freq: Once | INTRAVENOUS | Status: DC | PRN

## 2021-05-08 MED ORDER — FENTANYL CITRATE (PF) 100 MCG/2ML IJ SOLN
INTRAMUSCULAR | Status: AC
  Filled 2021-05-08: qty 2

## 2021-05-08 MED ORDER — ONDANSETRON HCL 4 MG/2ML IJ SOLN: 4.0000 mg | Freq: Once | INTRAMUSCULAR | Status: DC | PRN

## 2021-05-08 MED ORDER — ONDANSETRON HCL 4 MG/2ML IJ SOLN
INTRAMUSCULAR | Status: AC
  Filled 2021-05-08: qty 2

## 2021-05-08 MED ORDER — 0.9 % SODIUM CHLORIDE (POUR BTL) OPTIME
TOPICAL | Status: DC | PRN
  Administered 2021-05-08: 60 mL

## 2021-05-08 MED ORDER — MIDAZOLAM HCL 2 MG/2ML IJ SOLN
INTRAMUSCULAR | Status: AC
  Filled 2021-05-08: qty 2

## 2021-05-08 MED ORDER — ONDANSETRON HCL 4 MG/2ML IJ SOLN
INTRAMUSCULAR | Status: DC | PRN
  Administered 2021-05-08: 4 mg via INTRAVENOUS

## 2021-05-08 MED ORDER — LIDOCAINE HCL (PF) 2 % IJ SOLN
INTRAMUSCULAR | Status: DC | PRN
  Administered 2021-05-08: 200 mg via PERINEURAL

## 2021-05-08 MED ORDER — PROPOFOL 500 MG/50ML IV EMUL
INTRAVENOUS | Status: DC | PRN
  Administered 2021-05-08: 125 ug/kg/min via INTRAVENOUS

## 2021-05-08 MED ORDER — CLINDAMYCIN PHOSPHATE 900 MG/50ML IV SOLN
INTRAVENOUS | Status: DC | PRN
  Administered 2021-05-08: 900 mg via INTRAVENOUS

## 2021-05-08 SURGICAL SUPPLY — 28 items
APL PRP STRL LF DISP 70% ISPRP (MISCELLANEOUS) ×1
BLADE MINI RND TIP GREEN BEAV (BLADE) ×2 IMPLANT
BLADE SURG 15 STRL LF DISP TIS (BLADE) ×1 IMPLANT
BLADE SURG 15 STRL SS (BLADE) ×2
BNDG CMPR 9X4 STRL LF SNTH (GAUZE/BANDAGES/DRESSINGS) ×1
BNDG ELASTIC 2X5.8 VLCR STR LF (GAUZE/BANDAGES/DRESSINGS) ×1 IMPLANT
BNDG ESMARK 4X9 LF (GAUZE/BANDAGES/DRESSINGS) ×1 IMPLANT
CHLORAPREP W/TINT 26 (MISCELLANEOUS) ×2 IMPLANT
CORD BIPOLAR FORCEPS 12FT (ELECTRODE) ×2 IMPLANT
COVER BACK TABLE 60X90IN (DRAPES) ×2 IMPLANT
COVER MAYO STAND STRL (DRAPES) ×2 IMPLANT
DRAPE EXTREMITY T 121X128X90 (DISPOSABLE) ×2 IMPLANT
DRAPE SURG 17X23 STRL (DRAPES) ×2 IMPLANT
GAUZE SPONGE 4X4 12PLY STRL (GAUZE/BANDAGES/DRESSINGS) ×2 IMPLANT
GAUZE XEROFORM 1X8 LF (GAUZE/BANDAGES/DRESSINGS) ×2 IMPLANT
GLOVE SRG 8 PF TXTR STRL LF DI (GLOVE) ×1 IMPLANT
GLOVE SURG ENC MOIS LTX SZ7.5 (GLOVE) ×2 IMPLANT
GLOVE SURG UNDER POLY LF SZ8 (GLOVE) ×4
GOWN STRL REUS W/TWL 2XL LVL3 (GOWN DISPOSABLE) ×1 IMPLANT
GOWN STRL REUS W/TWL XL LVL3 (GOWN DISPOSABLE) ×2 IMPLANT
NS IRRIG 1000ML POUR BTL (IV SOLUTION) ×2 IMPLANT
PACK BASIN DAY SURGERY FS (CUSTOM PROCEDURE TRAY) ×2 IMPLANT
STOCKINETTE 4X48 STRL (DRAPES) ×2 IMPLANT
SUT CHROMIC 6 0 PS 4 (SUTURE) ×1 IMPLANT
SUT ETHILON 4 0 PS 2 18 (SUTURE) IMPLANT
SYR BULB EAR ULCER 3OZ GRN STR (SYRINGE) ×2 IMPLANT
TOWEL GREEN STERILE FF (TOWEL DISPOSABLE) ×4 IMPLANT
UNDERPAD 30X36 HEAVY ABSORB (UNDERPADS AND DIAPERS) ×2 IMPLANT

## 2021-05-08 NOTE — Op Note (Signed)
NAME: Anthony [name] [facility] MEDICAL RECORD NO: [id number] DATE OF BIRTH: Aug 29, 1950 FACILITY: [facility] LOCATION: [facility] PHYSICIAN: Tennis Must, MD   OPERATIVE REPORT   DATE OF PROCEDURE: 05/08/21    PREOPERATIVE DIAGNOSIS: Right long finger melanonychia   POSTOPERATIVE DIAGNOSIS: Right long finger melanonychia   PROCEDURE: Right long finger nail unit biopsy   SURGEON:  Leanora Cover, M.D.   ASSISTANT: none   ANESTHESIA:  Regional with sedation   INTRAVENOUS FLUIDS:  Per anesthesia flow sheet.   ESTIMATED BLOOD LOSS:  Minimal.   COMPLICATIONS:  None.   SPECIMENS: Right long finger nail to micro for fungal culture, right long finger nail unit to pathology   TOURNIQUET TIME:    Total Tourniquet Time Documented: Upper Arm (Right) - 18 minutes Total: Upper Arm (Right) - 18 minutes    DISPOSITION:  Stable to PACU.   INDICATIONS: 70 year old male with discoloration of right long finger nail.  This is not resolved with fungal treatments.  He wishes to proceed with nail unit biopsy.  Risks, benefits and alternatives of surgery were discussed including the risks of blood loss, infection, damage to nerves, vessels, tendons, ligaments, bone for surgery, need for additional surgery, complications with wound healing, continued pain, stiffness.  He voiced understanding of these risks and elected to proceed.  OPERATIVE COURSE:  After being identified preoperatively by myself,  the patient and I agreed on the procedure and site of the procedure.  The surgical site was marked.  Surgical consent had been signed. He was given IV antibiotics as preoperative antibiotic prophylaxis. He was transferred to the operating room and placed on the operating table in supine position with the Right upper extremity on an arm board.  Sedation was induced by the anesthesiologist. A regional block had been performed by anesthesia in preoperative holding.    Right upper extremity was prepped  and draped in normal sterile orthopedic fashion.  A surgical pause was performed between the surgeons, anesthesia, and operating room staff and all were in agreement as to the patient, procedure, and site of procedure.  Tourniquet at the proximal aspect of the extremity was inflated to 250 mmHg after exsanguination of the arm with an Esmarch bandage.  The melanonychia was marked on the dorsal nail fold.  The nail was removed with a freer elevator.  Was thickened at the radial side and the location of the discoloration.  The nail was sent to micro for fungal culture.  There was 1 small spot of discoloration in the nailbed.  The marked area included the radial side of the nailbed to the nail gutter.  A biopsy was performed excising all nailbed that was in the location of the discolored nail.  This included the dorsal nail fold.  The tissue was sent to pathology for examination.  The wound was copiously irrigated with sterile saline.  The radial soft tissues were able to be brought over the edge of the bone and reapproximated to the remaining nailbed.  6-0 chromic suture was used in interrupted fashion to reapproximate the soft tissues including the dorsal nail fold.  A piece of Xeroform was then placed into the nail fold and the wound dressed with sterile Xeroform and 4 x 4 and wrapped with a Coban dressing lightly.  An AlumaFoam splint was placed and wrapped lightly with Coban dressing.  The tourniquet was deflated at 18 minutes.  Fingertips were pink with brisk capillary refill after deflation of tourniquet.  The operative  drapes  were broken down.  The patient was awoken from anesthesia safely.  He was transferred back to the stretcher and taken to PACU in stable condition.  I will see him back in the office in 1 week for postoperative followup.  I will give him a prescription for Norco 5/325 1-2 tabs PO q6 hours prn pain, dispense #15.   Leanora Cover, MD Electronically signed, 05/08/21

## 2021-05-08 NOTE — Progress Notes (Signed)
Assisted Dr. Houser with right, ultrasound guided, supraclavicular block. Side rails up, monitors on throughout procedure. See vital signs in flow sheet. Tolerated Procedure well. 

## 2021-05-08 NOTE — Discharge Instructions (Addendum)

## 2021-05-08 NOTE — Transfer of Care (Signed)
Immediate Anesthesia Transfer of Care Note  Patient: Anthony [name]  Procedure(s) Performed: RIGHT LONG FINGER NAIL UNIT BIOPSY (Right)  Patient Location: PACU  Anesthesia Type:MAC combined with regional for post-op pain  Level of Consciousness: drowsy  Airway & Oxygen Therapy: Patient Spontanous Breathing and Patient connected to face mask oxygen  Post-op Assessment: Report given to RN and Post -op Vital signs reviewed and stable  Post vital signs: Reviewed and stable  Last Vitals:  Vitals Value Taken Time  BP    Temp    Pulse    Resp    SpO2      Last Pain:  Vitals:   05/08/21 1304  TempSrc: Oral  PainSc: 0-No pain         Complications: No notable events documented.

## 2021-05-08 NOTE — Anesthesia Procedure Notes (Addendum)
Anesthesia Regional Block: Supraclavicular block   Pre-Anesthetic Checklist: , timeout performed,  Correct Patient, Correct Site, Correct Laterality,  Correct Procedure, Correct Position, site marked,  Risks and benefits discussed,  Surgical consent,  Pre-op evaluation,  At surgeon's request and post-op pain management  Laterality: Right and Upper  Prep: chloraprep       Needles:  Injection technique: Single-shot  Needle Type: Echogenic Needle     Needle Length: 5cm  Needle Gauge: 21     Additional Needles:   Procedures:,,,, ultrasound used (permanent image in chart),,    Narrative:  Start time: 05/08/2021 1:15 PM End time: 05/08/2021 1:22 PM Injection made incrementally with aspirations every 5 mL.  Performed by: Personally  Anesthesiologist: Barnet Glasgow, MD

## 2021-05-08 NOTE — H&P (Signed)
Anthony [name] is an 70 y.o. male.   Chief Complaint: nail discoloration HPI: 70 yo male with discoloration right long fingernail.  He wishes to proceed with nail unit biopsy.  Allergies:  Allergies  Allergen Reactions   Augmentin [Amoxicillin-Pot Clavulanate]     Severe rash   Levaquin [Levofloxacin]     Muscle tightness-told to never take it again   Nsaids     Gi upset   Suvorexant     Other reaction(s): tongue,lip, face swelling   Tolmetin Nausea Only    Gi upset   Trazodone     Other reaction(s): prolonged erection    Past Medical History:  Diagnosis Date   Asbestos exposure 09/14/2016   Worked as a Chief Financial Officer many years ago, disturbing asbestos insulation and also smoking at that time   Depression    GERD (gastroesophageal reflux disease)    Glaucoma    Headache(784.0)    Hypertension    Obstructive sleep apnea 12/20/2012   NPSG 10/01/12- AHI 16.2/ hr, weight 172 lbs, mild limb jerks CPAP auto> 9 cwp APS    Psoriasis    Pulmonary embolism ([place]) 08/22/2017   Multiple pulmonary emboli identified on CTa chest 08/15/17 at [facility]. Report in Media.  Eliquis begun 08/2017   Seasonal allergies    Sleep apnea    using a cpap-moderate    Past Surgical History:  Procedure Laterality Date   APPENDECTOMY     CARPAL TUNNEL WITH CUBITAL TUNNEL  2008   right   CARPAL TUNNEL WITH CUBITAL TUNNEL  2009   and ulnar nerve-left   ESOPHAGEAL MANOMETRY N/A 10/25/2018   Procedure: ESOPHAGEAL MANOMETRY (EM);  Surgeon: Wilford Corner, MD;  Location: [facility];  Service: Endoscopy;  Laterality: N/A;   EXTERNAL EAR SURGERY     ears reset surgically age 48   [place] IMPEDANCE STUDY N/A 10/25/2018   Procedure: [place] IMPEDANCE STUDY;  Surgeon: Wilford Corner, MD;  Location: [facility];  Service: Endoscopy;  Laterality: N/A;   RETINAL DETACHMENT SURGERY  2004   right   RETINAL DETACHMENT SURGERY  2002   left   SHOULDER ARTHROSCOPY  2006   left   TONSILLECTOMY      TRIGGER FINGER RELEASE Left 02/15/2013   Procedure: RELEASE TRIGGER FINGER/A-1 PULLEY LEFT LONG FINGER;  Surgeon: Cammie Sickle., MD;  Location: [place];  Service: Orthopedics;  Laterality: Left;   TRIGGER FINGER RELEASE Left 09/06/2013   Procedure: RELEASE TRIGGER FINGER/A-1 PULLEY;  Surgeon: Cammie Sickle., MD;  Location: [place];  Service: Orthopedics;  Laterality: Left;   TRIGGER FINGER RELEASE Right 08/05/2014   Procedure: RIGHT SMALL TRIGGER RELEASE ;  Surgeon: Leanora Cover, MD;  Location: [place];  Service: Orthopedics;  Laterality: Right;   ULNAR NERVE TRANSPOSITION Left 09/06/2013   Procedure: LEFT ANTERIOR SUBCUTANEOUS TRANSPOSITION;  Surgeon: Cammie Sickle., MD;  Location: [place];  Service: Orthopedics;  Laterality: Left;    Family History: History reviewed. No pertinent family history.  Social History:   reports that he quit smoking about 50 years ago. His smoking use included cigarettes. He has a 5.00 pack-year smoking history. He has never used smokeless tobacco. He reports current alcohol use. He reports that he does not use drugs.  Medications: No medications prior to admission.    No results found for this or any previous visit (from the past 48 hour(s)).  No results found.  Height 5\' 7"  (1.702 m), weight 88.5 kg.  General appearance: alert, cooperative, and appears stated age Head: Normocephalic, without obvious abnormality, atraumatic Neck: supple, symmetrical, trachea midline Cardio: regular rate and rhythm Resp: clear to auscultation bilaterally Extremities: Intact sensation and capillary refill all digits.  +epl/fpl/io.  No wounds.  Pulses: 2+ and symmetric Skin: Skin color, texture, turgor normal. No rashes or lesions Neurologic: Grossly normal Incision/Wound: none  Assessment/Plan Right long finger melanonychia.  Plan nail unit biopsy right long finger.  Non  operative and operative treatment options have been discussed with the patient and patient wishes to proceed with operative treatment. Risks, benefits, and alternatives of surgery have been discussed and the patient agrees with the plan of care.   Leanora Cover 05/08/2021, 12:28 PM

## 2021-05-09 NOTE — Anesthesia Postprocedure Evaluation (Signed)
Anesthesia Post Note  Patient: Anthony [name]  Procedure(s) Performed: RIGHT LONG FINGER NAIL UNIT BIOPSY (Right: Finger)     Patient location during evaluation: PACU Anesthesia Type: Regional Level of consciousness: awake and alert Pain management: pain level controlled Vital Signs Assessment: post-procedure vital signs reviewed and stable Respiratory status: spontaneous breathing, nonlabored ventilation, respiratory function stable and patient connected to nasal cannula oxygen Cardiovascular status: stable and blood pressure returned to baseline Postop Assessment: no apparent nausea or vomiting Anesthetic complications: no   No notable events documented.  Last Vitals:  Vitals:   05/08/21 1430 05/08/21 1445  BP: 110/72 129/80  Pulse: (!) 52 (!) 57  Resp: 14 16  Temp:  36.4 C  SpO2: 95% 93%    Last Pain:  Vitals:   05/08/21 1445  TempSrc:   PainSc: 0-No pain                 Barnet Glasgow

## 2021-05-11 ENCOUNTER — Encounter (HOSPITAL_BASED_OUTPATIENT_CLINIC_OR_DEPARTMENT_OTHER): Payer: Self-pay | Admitting: Orthopedic Surgery

## 2021-05-12 LAB — SURGICAL PATHOLOGY

## 2021-05-15 DIAGNOSIS — K219 Gastro-esophageal reflux disease without esophagitis: Secondary | ICD-10-CM | POA: Diagnosis not present

## 2021-05-15 DIAGNOSIS — R12 Heartburn: Secondary | ICD-10-CM | POA: Diagnosis not present

## 2021-05-30 LAB — FUNGAL ORGANISM REFLEX

## 2021-05-30 LAB — FUNGUS CULTURE WITH STAIN

## 2021-05-30 LAB — FUNGUS CULTURE RESULT

## 2021-07-16 DIAGNOSIS — R7301 Impaired fasting glucose: Secondary | ICD-10-CM | POA: Diagnosis not present

## 2021-07-16 DIAGNOSIS — I10 Essential (primary) hypertension: Secondary | ICD-10-CM | POA: Diagnosis not present

## 2021-07-16 DIAGNOSIS — Z0000 Encounter for general adult medical examination without abnormal findings: Secondary | ICD-10-CM | POA: Diagnosis not present

## 2021-07-16 DIAGNOSIS — E7800 Pure hypercholesterolemia, unspecified: Secondary | ICD-10-CM | POA: Diagnosis not present

## 2021-07-16 DIAGNOSIS — Z79899 Other long term (current) drug therapy: Secondary | ICD-10-CM | POA: Diagnosis not present

## 2021-07-16 DIAGNOSIS — R221 Localized swelling, mass and lump, neck: Secondary | ICD-10-CM | POA: Diagnosis not present

## 2021-07-17 DIAGNOSIS — L608 Other nail disorders: Secondary | ICD-10-CM | POA: Diagnosis not present

## 2021-07-18 DIAGNOSIS — U071 COVID-19: Secondary | ICD-10-CM | POA: Diagnosis not present

## 2021-07-19 DIAGNOSIS — U071 COVID-19: Secondary | ICD-10-CM | POA: Diagnosis not present

## 2021-07-29 ENCOUNTER — Other Ambulatory Visit (HOSPITAL_BASED_OUTPATIENT_CLINIC_OR_DEPARTMENT_OTHER): Payer: Self-pay | Admitting: Family Medicine

## 2021-07-29 DIAGNOSIS — R221 Localized swelling, mass and lump, neck: Secondary | ICD-10-CM

## 2021-07-31 DIAGNOSIS — U071 COVID-19: Secondary | ICD-10-CM | POA: Diagnosis not present

## 2021-07-31 DIAGNOSIS — B308 Other viral conjunctivitis: Secondary | ICD-10-CM | POA: Diagnosis not present

## 2021-08-05 ENCOUNTER — Other Ambulatory Visit: Payer: Self-pay

## 2021-08-05 ENCOUNTER — Encounter (HOSPITAL_BASED_OUTPATIENT_CLINIC_OR_DEPARTMENT_OTHER): Payer: Self-pay

## 2021-08-05 ENCOUNTER — Ambulatory Visit (HOSPITAL_BASED_OUTPATIENT_CLINIC_OR_DEPARTMENT_OTHER)
Admission: RE | Admit: 2021-08-05 | Discharge: 2021-08-05 | Disposition: A | Discharge status: Home / Self Care | Payer: Medicare Other | Source: Ambulatory Visit | Attending: Family Medicine | Admitting: Family Medicine

## 2021-08-05 DIAGNOSIS — R221 Localized swelling, mass and lump, neck: Secondary | ICD-10-CM | POA: Diagnosis not present

## 2021-08-05 MED ORDER — IOHEXOL 300 MG/ML  SOLN
75.0000 mL | Freq: Once | INTRAMUSCULAR | Status: AC | PRN
  Administered 2021-08-05: 08:00:00 75 mL via INTRAVENOUS

## 2021-08-13 DIAGNOSIS — Z961 Presence of intraocular lens: Secondary | ICD-10-CM | POA: Diagnosis not present

## 2021-08-13 DIAGNOSIS — H401331 Pigmentary glaucoma, bilateral, mild stage: Secondary | ICD-10-CM | POA: Diagnosis not present

## 2021-09-08 DIAGNOSIS — Z808 Family history of malignant neoplasm of other organs or systems: Secondary | ICD-10-CM | POA: Diagnosis not present

## 2021-09-08 DIAGNOSIS — I10 Essential (primary) hypertension: Secondary | ICD-10-CM | POA: Diagnosis not present

## 2021-09-08 DIAGNOSIS — L821 Other seborrheic keratosis: Secondary | ICD-10-CM | POA: Diagnosis not present

## 2021-09-08 DIAGNOSIS — G4700 Insomnia, unspecified: Secondary | ICD-10-CM | POA: Diagnosis not present

## 2021-09-08 DIAGNOSIS — Z23 Encounter for immunization: Secondary | ICD-10-CM | POA: Diagnosis not present

## 2021-09-08 DIAGNOSIS — K219 Gastro-esophageal reflux disease without esophagitis: Secondary | ICD-10-CM | POA: Diagnosis not present

## 2021-09-08 DIAGNOSIS — D225 Melanocytic nevi of trunk: Secondary | ICD-10-CM | POA: Diagnosis not present

## 2021-09-08 DIAGNOSIS — L219 Seborrheic dermatitis, unspecified: Secondary | ICD-10-CM | POA: Diagnosis not present

## 2021-09-08 DIAGNOSIS — E7800 Pure hypercholesterolemia, unspecified: Secondary | ICD-10-CM | POA: Diagnosis not present

## 2021-09-08 DIAGNOSIS — L578 Other skin changes due to chronic exposure to nonionizing radiation: Secondary | ICD-10-CM | POA: Diagnosis not present

## 2021-09-08 DIAGNOSIS — L814 Other melanin hyperpigmentation: Secondary | ICD-10-CM | POA: Diagnosis not present

## 2021-09-21 ENCOUNTER — Other Ambulatory Visit: Payer: Self-pay | Admitting: Cardiology

## 2021-09-22 ENCOUNTER — Other Ambulatory Visit: Payer: Self-pay

## 2021-09-22 ENCOUNTER — Encounter: Payer: Self-pay | Admitting: Cardiology

## 2021-09-22 ENCOUNTER — Ambulatory Visit (INDEPENDENT_AMBULATORY_CARE_PROVIDER_SITE_OTHER): Payer: Medicare Other | Admitting: Cardiology

## 2021-09-22 VITALS — BP 124/80 | HR 49

## 2021-09-22 DIAGNOSIS — R079 Chest pain, unspecified: Secondary | ICD-10-CM | POA: Insufficient documentation

## 2021-09-22 DIAGNOSIS — R0609 Other forms of dyspnea: Secondary | ICD-10-CM | POA: Diagnosis not present

## 2021-09-22 DIAGNOSIS — R001 Bradycardia, unspecified: Secondary | ICD-10-CM | POA: Diagnosis not present

## 2021-09-22 DIAGNOSIS — I2510 Atherosclerotic heart disease of native coronary artery without angina pectoris: Secondary | ICD-10-CM | POA: Diagnosis not present

## 2021-09-22 DIAGNOSIS — R072 Precordial pain: Secondary | ICD-10-CM

## 2021-09-22 DIAGNOSIS — Z79899 Other long term (current) drug therapy: Secondary | ICD-10-CM

## 2021-09-22 DIAGNOSIS — I10 Essential (primary) hypertension: Secondary | ICD-10-CM | POA: Insufficient documentation

## 2021-09-22 DIAGNOSIS — E7800 Pure hypercholesterolemia, unspecified: Secondary | ICD-10-CM | POA: Insufficient documentation

## 2021-09-22 DIAGNOSIS — Z86711 Personal history of pulmonary embolism: Secondary | ICD-10-CM | POA: Diagnosis not present

## 2021-09-22 DIAGNOSIS — G4733 Obstructive sleep apnea (adult) (pediatric): Secondary | ICD-10-CM

## 2021-09-22 DIAGNOSIS — Z01812 Encounter for preprocedural laboratory examination: Secondary | ICD-10-CM

## 2021-09-22 HISTORY — DX: Atherosclerotic heart disease of native coronary artery without angina pectoris: I25.10

## 2021-09-22 HISTORY — DX: Bradycardia, unspecified: R00.1

## 2021-09-22 LAB — BASIC METABOLIC PANEL
BUN/Creatinine Ratio: 14 (ref 10–24)
BUN: 13 mg/dL (ref 8–27)
CO2: 25 mmol/L (ref 20–29)
Calcium: 9.4 mg/dL (ref 8.6–10.2)
Chloride: 102 mmol/L (ref 96–106)
Creatinine, Ser: 0.95 mg/dL (ref 0.76–1.27)
Glucose: 104 mg/dL — ABNORMAL HIGH (ref 70–99)
Potassium: 4.3 mmol/L (ref 3.5–5.2)
Sodium: 138 mmol/L (ref 134–144)
eGFR: 86 mL/min/{1.73_m2} (ref >59)

## 2021-09-22 MED ORDER — ROSUVASTATIN CALCIUM 20 MG PO TABS: 20.0000 mg | ORAL_TABLET | Freq: Every day | ORAL | 3 refills | Status: DC

## 2021-09-22 NOTE — Progress Notes (Signed)
Cardiology Office Note:    Date:  09/22/2021   ID:  Anthony [name], DOB 1950/11/23, MRN [id number]  PCP:  Lujean Amel, MD   [facility] Providers Cardiologist:  Candee Furbish, MD     Referring MD: Lujean Amel, MD    History of Present Illness:    Anthony [name] is a 71 y.o. male here for follow up prior PE, bradycardia, OSA.  ZIO 2020 showed Chronotropic competence  Average heart rate was 63  Previously was having occasional dizzy spells when squatting to standing.   Prednisone being prescribed by rheumatology Zyrtec prescribed by dermatology Had rash with his BP cuff.   Overall spent 6 months with Eliquis, negative DVT negative hypercoagulable work-up no further recurrence.  Wife here today, nurse. Heart rate always slow.   He has been noticing more shortness of breath with physical exertion.  He also describes a chest tightness, restriction with physical activity as well.  This is relieved with rest.  No radiation of symptoms.  He did have a CT scan in July 2022 that showed some coronary artery calcification at [facility].  Has enjoyed the outdoors with his wife.  Rafting trips, kayak.  [place] trail on a prior Valentine's Day.  Past Medical History:  Diagnosis Date   Asbestos exposure 09/14/2016   Worked as a Chief Financial Officer many years ago, disturbing asbestos insulation and also smoking at that time   Coronary artery disease involving native coronary artery of native heart without angina pectoris 09/22/2021   CTA of chest 02/2021 - coronary artery calcifications   Depression    GERD (gastroesophageal reflux disease)    Glaucoma    Headache(784.0)    Hypertension    Obstructive sleep apnea 12/20/2012   NPSG 10/01/12- AHI 16.2/ hr, weight 172 lbs, mild limb jerks CPAP auto> 9 cwp APS    Psoriasis    Pulmonary embolism ([place]) 08/22/2017   Multiple pulmonary emboli identified on CTa chest 08/15/17 at [facility]. Report in Media.   Eliquis begun 08/2017   Seasonal allergies    Sinus bradycardia 09/22/2021   ZIO 2020 normal, Avg HR 60's. Chronotropic competence   Sleep apnea    using a cpap-moderate    Past Surgical History:  Procedure Laterality Date   APPENDECTOMY     CARPAL TUNNEL WITH CUBITAL TUNNEL  2008   right   CARPAL TUNNEL WITH CUBITAL TUNNEL  2009   and ulnar nerve-left   ESOPHAGEAL MANOMETRY N/A 10/25/2018   Procedure: ESOPHAGEAL MANOMETRY (EM);  Surgeon: Wilford Corner, MD;  Location: [facility];  Service: Endoscopy;  Laterality: N/A;   EXTERNAL EAR SURGERY     ears reset surgically age 87   NAILBED REPAIR Right 05/08/2021   Procedure: RIGHT LONG FINGER NAIL UNIT BIOPSY;  Surgeon: Leanora Cover, MD;  Location: [place];  Service: Orthopedics;  Laterality: Right;   [place] IMPEDANCE STUDY N/A 10/25/2018   Procedure: [place] IMPEDANCE STUDY;  Surgeon: Wilford Corner, MD;  Location: [facility];  Service: Endoscopy;  Laterality: N/A;   RETINAL DETACHMENT SURGERY  2004   right   RETINAL DETACHMENT SURGERY  2002   left   SHOULDER ARTHROSCOPY  2006   left   TONSILLECTOMY     TRIGGER FINGER RELEASE Left 02/15/2013   Procedure: RELEASE TRIGGER FINGER/A-1 PULLEY LEFT LONG FINGER;  Surgeon: Cammie Sickle., MD;  Location: [place];  Service: Orthopedics;  Laterality: Left;   TRIGGER FINGER RELEASE Left 09/06/2013  Procedure: RELEASE TRIGGER FINGER/A-1 PULLEY;  Surgeon: Cammie Sickle., MD;  Location: [place];  Service: Orthopedics;  Laterality: Left;   TRIGGER FINGER RELEASE Right 08/05/2014   Procedure: RIGHT SMALL TRIGGER RELEASE ;  Surgeon: Leanora Cover, MD;  Location: [place];  Service: Orthopedics;  Laterality: Right;   ULNAR NERVE TRANSPOSITION Left 09/06/2013   Procedure: LEFT ANTERIOR SUBCUTANEOUS TRANSPOSITION;  Surgeon: Cammie Sickle., MD;  Location: [place];  Service: Orthopedics;  Laterality: Left;     Current Medications: Current Meds  Medication Sig   ALPRAZolam (XANAX) 0.5 MG tablet Take 0.5 mg by mouth at bedtime as needed for anxiety.   amLODipine (NORVASC) 5 MG tablet TAKE 1 TABLET BY MOUTH EVERY DAY   aspirin EC 81 MG tablet Take 81 mg by mouth daily.   cetirizine (ZYRTEC) 10 MG tablet Take 10 mg by mouth daily.   Clobetasol Propionate (TEMOVATE) 0.05 % external spray Apply topically 2 (two) times daily.   EPINEPHrine 0.3 mg/0.3 mL IJ SOAJ injection    escitalopram (LEXAPRO) 20 MG tablet Take 20 mg by mouth daily.   HYDROcodone-acetaminophen (NORCO) 5-325 MG tablet 1-2 tabs po q6 hours prn pain   latanoprost (XALATAN) 0.005 % ophthalmic solution Place 1 drop into both eyes at bedtime. 1 drop OU HS   Multiple Vitamins-Minerals (PRESERVISION AREDS PO) Take 1 capsule by mouth daily.   omeprazole (PRILOSEC) 40 MG capsule Take 40 mg by mouth daily.   predniSONE (DELTASONE) 5 MG tablet 5 mg daily. Takes 5 mg total; 1/2 in AM & 1/2 in PM   rosuvastatin (CRESTOR) 20 MG tablet Take 1 tablet (20 mg total) by mouth daily.   tacrolimus (PROTOPIC) 0.1 % ointment Apply topically as needed.   telmisartan (MICARDIS) 20 MG tablet Take 1 tablet (20 mg total) by mouth daily.   valACYclovir (VALTREX) 500 MG tablet as needed.   [DISCONTINUED] atorvastatin (LIPITOR) 20 MG tablet Take 20 mg by mouth daily.   [DISCONTINUED] rosuvastatin (CRESTOR) 5 MG tablet Take 5 mg by mouth daily.     Allergies:   Augmentin [amoxicillin-pot clavulanate], Levaquin [levofloxacin], Nsaids, Suvorexant, Tolmetin, and Trazodone   Social History   Socioeconomic History   Marital status: Married    Spouse name: Not on file   Number of children: Not on file   Years of education: Not on file   Highest education level: Not on file  Occupational History   Not on file  Tobacco Use   Smoking status: Former    Packs/day: 1.00    Years: 5.00    Pack years: 5.00    Types: Cigarettes    Quit date: 08/09/1970    Years  since quitting: 51.1   Smokeless tobacco: Never  Substance and Sexual Activity   Alcohol use: Yes    Alcohol/week: 7.0 standard drinks    Types: 7 Cans of beer per week    Comment: social   Drug use: Never   Sexual activity: Not on file  Other Topics Concern   Not on file  Social History Narrative   Not on file   Social Determinants of Health   Financial Resource Strain: Not on file  Food Insecurity: Not on file  Transportation Needs: Not on file  Physical Activity: Not on file  Stress: Not on file  Social Connections: Not on file     Family History: The patient's family history is not on file.  ROS:   Please see  the history of present illness.     All other systems reviewed and are negative.  EKGs/Labs/Other Studies Reviewed:    The following studies were reviewed today: Echocardiogram-EF 28% grade 2 diastolic dysfunction  EKG:  EKG is  ordered today.  The ekg ordered today demonstrates sinus bradycardia 49 no other abnormalities, prior sinus bradycardia 57 no other abnormalities, prior EKG sinus rhythm 60 with no other abnormalities Recent Labs: No results found for requested labs within last 8760 hours.  Recent Lipid Panel No results found for: CHOL, TRIG, HDL, CHOLHDL, VLDL, LDLCALC, LDLDIRECT           Physical Exam:    VS:  BP 124/80 (BP Location: Left Arm, Patient Position: Sitting, Cuff Size: Normal)    Pulse (!) 49    Ht (P) 5\' 7"  (1.702 m)    Wt (P) 195 lb (88.5 kg)    SpO2 94%    BMI (P) 30.54 kg/m     Wt Readings from Last 3 Encounters:  09/22/21 (P) 195 lb (88.5 kg)  05/08/21 188 lb 15 oz (85.7 kg)  03/06/21 191 lb 6.4 oz (86.8 kg)     GEN:  Well nourished, well developed in no acute distress HEENT: Normal NECK: No JVD; No carotid bruits LYMPHATICS: No lymphadenopathy CARDIAC: Brady reg, no murmurs, no rubs, gallops RESPIRATORY:  Clear to auscultation without rales, wheezing or rhonchi  ABDOMEN: Soft, non-tender,  non-distended MUSCULOSKELETAL:  No edema; No deformity  SKIN: Warm and dry NEUROLOGIC:  Alert and oriented x 3 PSYCHIATRIC:  Normal affect   ASSESSMENT:    1. Coronary artery disease involving native coronary artery of native heart without angina pectoris   2. Sinus bradycardia   3. Obstructive sleep apnea   4. History of pulmonary embolus (PE)   5. Primary hypertension   6. Pure hypercholesterolemia   7. Chest pain of uncertain etiology   8. Medication management   9. DOE (dyspnea on exertion)   10. Pre-procedure lab exam   11. Precordial chest pain    PLAN:    In order of problems listed above:  Sinus bradycardia Avoid AV nodal blockers Doing well Prior history ranging from 50s to 90s usual for him.  Sounds more orthostatic when he explains his dizziness.  Continue to hydrate well.  Monitor has been reassuring.   Obstructive sleep apnea Dr. Annamaria Boots following. Note reviewed.   History of pulmonary embolus (PE) Stable no recurrence.  Multiple pulmonary emboli identified on CTa chest 08/15/17 at [facility]. Report in Media.  Eliquis for 6 months. Hypercoag workup normal.  Repeat CTA 02/2021 - no new PE  Primary hypertension Doing well on amlodipine 5 telmisartan 20 At goal Diet and exercise Can sometimes have dizziness when standing up/orthostatic type symptoms.  He knows to be careful.  He showed me a list of his blood pressures at home and they were all elevated.  Pure hypercholesterolemia On Crestor 5mg  once a day.  We will increase his Crestor to 10 mg once a day.  Repeat lipid panel in 3 months. LDL goal < 70 with CAD on CT in 2022, prior LDL 89.  Coronary artery disease involving native coronary artery of native heart without angina pectoris CTA of chest 02/2021 - coronary artery calcifications On Crestor. Asa 81  Chest pain of uncertain etiology Given his tightness, shortness of breath, we will order an echocardiogram to ensure proper structure and function.   Previously he did have normal EF with grade 2 diastolic dysfunction.  I will also check a coronary CT scan with possible FFR analysis.  This will be helpful.         Medication Adjustments/Labs and Tests Ordered: Current medicines are reviewed at length with the patient today.  Concerns regarding medicines are outlined above.  Orders Placed This Encounter  Procedures   CT CORONARY MORPH W/CTA COR W/SCORE W/CA W/CM &/OR WO/CM   Basic metabolic panel   ALT   Lipid panel   EKG 12-Lead   ECHOCARDIOGRAM COMPLETE   Meds ordered this encounter  Medications   rosuvastatin (CRESTOR) 20 MG tablet    Sig: Take 1 tablet (20 mg total) by mouth daily.    Dispense:  90 tablet    Refill:  3    Patient Instructions  Medication Instructions:  Please increase your Crestor to 20 mg a day. Continue all other medications as listed.  *If you need a refill on your cardiac medications before your next appointment, please call your pharmacy*   Lab Work: Please have blood work today (BMP) And Lipid/ALT in 3 months. If you have labs (blood work) drawn today and your tests are completely normal, you will receive your results only by: [place] (if you have MyChart) OR A paper copy in the mail If you have any lab test that is abnormal or we need to change your treatment, we will call you to review the results.   Testing/Procedures: Your physician has requested that you have an echocardiogram. Echocardiography is a painless test that uses sound waves to create images of your heart. It provides your doctor with information about the size and shape of your heart and how well your hearts chambers and valves are working. This procedure takes approximately one hour. There are no restrictions for this procedure.    Your cardiac CT will be scheduled at:   [facility] [address] [place], [place] [address] [phone]  Please arrive at the [facility] main entrance (entrance  A) of [facility] 30 minutes prior to test start time. You can use the FREE valet parking offered at the main entrance (encouraged to control the heart rate for the test) Proceed to the [facility] Radiology Department (first floor) to check-in and test prep.  Please follow these instructions carefully (unless otherwise directed):  Hold all erectile dysfunction medications at least 3 days (72 hrs) prior to test.  On the Night Before the Test: Be sure to Drink plenty of water. Do not consume any caffeinated/decaffeinated beverages or chocolate 12 hours prior to your test. Do not take any antihistamines 12 hours prior to your test.  On the Day of the Test: Drink plenty of water until 1 hour prior to the test. Do not eat any food 4 hours prior to the test. You may take your regular medications prior to the test.  Take metoprolol (Lopressor) two hours prior to test. HOLD Furosemide/Hydrochlorothiazide morning of the test.      After the Test: Drink plenty of water. After receiving IV contrast, you may experience a mild flushed feeling. This is normal. On occasion, you may experience a mild rash up to 24 hours after the test. This is not dangerous. If this occurs, you can take Benadryl 25 mg and increase your fluid intake. If you experience trouble breathing, this can be serious. If it is severe call 911 IMMEDIATELY. If it is mild, please call our office.  We will call to schedule your test 2-4 weeks out understanding  that some insurance companies will need an authorization prior to the service being performed.   For non-scheduling related questions, please contact the cardiac imaging nurse navigator should you have any questions/concerns: Marchia Bond, Cardiac Imaging Nurse Navigator Gordy Clement, Cardiac Imaging Nurse Navigator [place] [facility] Direct Office Dial: [phone]   For scheduling needs, including cancellations and rescheduling, please call  [place], [phone].  Follow-Up: At [facility], you and your health needs are our priority.  As part of our continuing mission to provide you with exceptional heart care, we have created designated Provider Care Teams.  These Care Teams include your primary Cardiologist (physician) and Advanced Practice Providers (APPs -  Physician Assistants and Nurse Practitioners) who all work together to provide you with the care you need, when you need it.  We recommend signing up for the patient portal called "MyChart".  Sign up information is provided on this After Visit Summary.  MyChart is used to connect with patients for Virtual Visits (Telemedicine).  Patients are able to view lab/test results, encounter notes, upcoming appointments, etc.  Non-urgent messages can be sent to your provider as well.   To learn more about what you can do with MyChart, go to [email or web address].    Your next appointment:   1 year(s)  The format for your next appointment:   In Person  Provider:   Candee Furbish, MD     Thank you for choosing [facility]!!      Signed, Candee Furbish, MD  09/22/2021 10:03 AM    [place]

## 2021-09-22 NOTE — Assessment & Plan Note (Addendum)
Doing well on amlodipine 5 telmisartan 20 At goal Diet and exercise Can sometimes have dizziness when standing up/orthostatic type symptoms.  He knows to be careful.  He showed me a list of his blood pressures at home and they were all elevated.

## 2021-09-22 NOTE — Assessment & Plan Note (Signed)
Avoid AV nodal blockers Doing well Prior history ranging from 50s to 90s usual for him.  Sounds more orthostatic when he explains his dizziness.  Continue to hydrate well.  Monitor has been reassuring.

## 2021-09-22 NOTE — Patient Instructions (Addendum)
Medication Instructions:  Please increase your Crestor to 20 mg a day. Continue all other medications as listed.  *If you need a refill on your cardiac medications before your next appointment, please call your pharmacy*   Lab Work: Please have blood work today (BMP) And Lipid/ALT in 3 months. If you have labs (blood work) drawn today and your tests are completely normal, you will receive your results only by: [place] (if you have MyChart) OR A paper copy in the mail If you have any lab test that is abnormal or we need to change your treatment, we will call you to review the results.   Testing/Procedures: Your physician has requested that you have an echocardiogram. Echocardiography is a painless test that uses sound waves to create images of your heart. It provides your doctor with information about the size and shape of your heart and how well your hearts chambers and valves are working. This procedure takes approximately one hour. There are no restrictions for this procedure.    Your cardiac CT will be scheduled at:   [facility] [address] [place], [place] [address] [phone]  Please arrive at the [facility] main entrance (entrance A) of [facility] 30 minutes prior to test start time. You can use the FREE valet parking offered at the main entrance (encouraged to control the heart rate for the test) Proceed to the [facility] Radiology Department (first floor) to check-in and test prep.  Please follow these instructions carefully (unless otherwise directed):  Hold all erectile dysfunction medications at least 3 days (72 hrs) prior to test.  On the Night Before the Test: Be sure to Drink plenty of water. Do not consume any caffeinated/decaffeinated beverages or chocolate 12 hours prior to your test. Do not take any antihistamines 12 hours prior to your test.  On the Day of the Test: Drink plenty of water until 1 hour prior to the  test. Do not eat any food 4 hours prior to the test. You may take your regular medications prior to the test.  Take metoprolol (Lopressor) two hours prior to test. HOLD Furosemide/Hydrochlorothiazide morning of the test.      After the Test: Drink plenty of water. After receiving IV contrast, you may experience a mild flushed feeling. This is normal. On occasion, you may experience a mild rash up to 24 hours after the test. This is not dangerous. If this occurs, you can take Benadryl 25 mg and increase your fluid intake. If you experience trouble breathing, this can be serious. If it is severe call 911 IMMEDIATELY. If it is mild, please call our office.  We will call to schedule your test 2-4 weeks out understanding that some insurance companies will need an authorization prior to the service being performed.   For non-scheduling related questions, please contact the cardiac imaging nurse navigator should you have any questions/concerns: Marchia Bond, Cardiac Imaging Nurse Navigator Gordy Clement, Cardiac Imaging Nurse Navigator [place] [facility] Direct Office Dial: [phone]   For scheduling needs, including cancellations and rescheduling, please call [place], [phone].  Follow-Up: At [facility], you and your health needs are our priority.  As part of our continuing mission to provide you with exceptional heart care, we have created designated Provider Care Teams.  These Care Teams include your primary Cardiologist (physician) and Advanced Practice Providers (APPs -  Physician Assistants and Nurse Practitioners) who all work together to provide you with the care you  need, when you need it.  We recommend signing up for the patient portal called "MyChart".  Sign up information is provided on this After Visit Summary.  MyChart is used to connect with patients for Virtual Visits (Telemedicine).  Patients are able to view lab/test results, encounter notes,  upcoming appointments, etc.  Non-urgent messages can be sent to your provider as well.   To learn more about what you can do with MyChart, go to [email or web address].    Your next appointment:   1 year(s)  The format for your next appointment:   In Person  Provider:   Candee Furbish, MD     Thank you for choosing [facility]!!

## 2021-09-22 NOTE — Assessment & Plan Note (Addendum)
Stable no recurrence.  Multiple pulmonary emboli identified on CTa chest 08/15/17 at [facility]. Report in Media.  Eliquis for 6 months. Hypercoag workup normal.  Repeat CTA 02/2021 - no new PE

## 2021-09-22 NOTE — Assessment & Plan Note (Signed)
Given his tightness, shortness of breath, we will order an echocardiogram to ensure proper structure and function.  Previously he did have normal EF with grade 2 diastolic dysfunction.  I will also check a coronary CT scan with possible FFR analysis.  This will be helpful.

## 2021-09-22 NOTE — Assessment & Plan Note (Signed)
CTA of chest 02/2021 - coronary artery calcifications On Crestor. Asa 81

## 2021-09-22 NOTE — Assessment & Plan Note (Signed)
Dr. Annamaria Boots following. Note reviewed.

## 2021-09-22 NOTE — Assessment & Plan Note (Addendum)
On Crestor 5mg  once a day.  We will increase his Crestor to 10 mg once a day.  Repeat lipid panel in 3 months. LDL goal < 70 with CAD on CT in 2022, prior LDL 89.

## 2021-09-29 DIAGNOSIS — U071 COVID-19: Secondary | ICD-10-CM | POA: Diagnosis not present

## 2021-10-01 ENCOUNTER — Telehealth (HOSPITAL_COMMUNITY): Payer: Self-pay | Admitting: Emergency Medicine

## 2021-10-01 NOTE — Telephone Encounter (Signed)
Reaching out to patient to offer assistance regarding upcoming cardiac imaging study; pt verbalizes understanding of appt date/time, parking situation and where to check in, pre-test NPO status and medications ordered, and verified current allergies; name and call back number provided for further questions should they arise Marchia Bond RN Navigator Cardiac Imaging [facility] [phone] office [phone] cell   Denies iv issues Arrival 400

## 2021-10-02 ENCOUNTER — Other Ambulatory Visit: Payer: Self-pay

## 2021-10-02 ENCOUNTER — Ambulatory Visit (HOSPITAL_COMMUNITY)
Admission: RE | Admit: 2021-10-02 | Discharge: 2021-10-02 | Disposition: A | Discharge status: Home / Self Care | Payer: Medicare Other | Source: Ambulatory Visit | Attending: Cardiology | Admitting: Cardiology

## 2021-10-02 DIAGNOSIS — R0609 Other forms of dyspnea: Secondary | ICD-10-CM | POA: Insufficient documentation

## 2021-10-02 DIAGNOSIS — R072 Precordial pain: Secondary | ICD-10-CM | POA: Diagnosis not present

## 2021-10-02 MED ORDER — NITROGLYCERIN 0.4 MG SL SUBL
SUBLINGUAL_TABLET | SUBLINGUAL | Status: AC
  Filled 2021-10-02: qty 2

## 2021-10-02 MED ORDER — NITROGLYCERIN 0.4 MG SL SUBL
0.8000 mg | SUBLINGUAL_TABLET | Freq: Once | SUBLINGUAL | Status: AC
Start: 2021-10-02 — End: 2021-10-02
  Administered 2021-10-02: 0.8 mg via SUBLINGUAL

## 2021-10-02 MED ORDER — IOHEXOL 350 MG/ML SOLN
95.0000 mL | Freq: Once | INTRAVENOUS | Status: AC | PRN
  Administered 2021-10-02: 95 mL via INTRAVENOUS

## 2021-10-05 DIAGNOSIS — Z125 Encounter for screening for malignant neoplasm of prostate: Secondary | ICD-10-CM | POA: Diagnosis not present

## 2021-10-05 DIAGNOSIS — R3915 Urgency of urination: Secondary | ICD-10-CM | POA: Diagnosis not present

## 2021-10-05 DIAGNOSIS — N401 Enlarged prostate with lower urinary tract symptoms: Secondary | ICD-10-CM | POA: Diagnosis not present

## 2021-10-09 ENCOUNTER — Ambulatory Visit (HOSPITAL_COMMUNITY): Payer: Medicare Other | Attending: Cardiology

## 2021-10-09 ENCOUNTER — Other Ambulatory Visit: Payer: Self-pay

## 2021-10-09 DIAGNOSIS — R0609 Other forms of dyspnea: Secondary | ICD-10-CM | POA: Diagnosis not present

## 2021-10-09 DIAGNOSIS — R079 Chest pain, unspecified: Secondary | ICD-10-CM | POA: Insufficient documentation

## 2021-10-09 LAB — ECHOCARDIOGRAM COMPLETE
Area-P 1/2: 2.96 cm2
S' Lateral: 2.8 cm

## 2021-10-21 DIAGNOSIS — M159 Polyosteoarthritis, unspecified: Secondary | ICD-10-CM | POA: Diagnosis not present

## 2021-10-21 DIAGNOSIS — M67912 Unspecified disorder of synovium and tendon, left shoulder: Secondary | ICD-10-CM | POA: Diagnosis not present

## 2021-10-21 DIAGNOSIS — Z7952 Long term (current) use of systemic steroids: Secondary | ICD-10-CM | POA: Diagnosis not present

## 2021-10-21 DIAGNOSIS — M353 Polymyalgia rheumatica: Secondary | ICD-10-CM | POA: Diagnosis not present

## 2021-12-02 DIAGNOSIS — K219 Gastro-esophageal reflux disease without esophagitis: Secondary | ICD-10-CM | POA: Diagnosis not present

## 2021-12-02 DIAGNOSIS — I10 Essential (primary) hypertension: Secondary | ICD-10-CM | POA: Diagnosis not present

## 2021-12-02 DIAGNOSIS — E7800 Pure hypercholesterolemia, unspecified: Secondary | ICD-10-CM | POA: Diagnosis not present

## 2021-12-02 DIAGNOSIS — G4700 Insomnia, unspecified: Secondary | ICD-10-CM | POA: Diagnosis not present

## 2021-12-10 DIAGNOSIS — H02834 Dermatochalasis of left upper eyelid: Secondary | ICD-10-CM | POA: Diagnosis not present

## 2021-12-10 DIAGNOSIS — H401331 Pigmentary glaucoma, bilateral, mild stage: Secondary | ICD-10-CM | POA: Diagnosis not present

## 2021-12-10 DIAGNOSIS — H02831 Dermatochalasis of right upper eyelid: Secondary | ICD-10-CM | POA: Diagnosis not present

## 2021-12-11 ENCOUNTER — Other Ambulatory Visit: Payer: Medicare Other | Admitting: *Deleted

## 2021-12-11 DIAGNOSIS — E7800 Pure hypercholesterolemia, unspecified: Secondary | ICD-10-CM

## 2021-12-11 DIAGNOSIS — Z79899 Other long term (current) drug therapy: Secondary | ICD-10-CM | POA: Diagnosis not present

## 2021-12-11 LAB — LIPID PANEL
Chol/HDL Ratio: 3.9 ratio (ref 0.0–5.0)
Cholesterol, Total: 148 mg/dL (ref 100–199)
HDL: 38 mg/dL — ABNORMAL LOW (ref >39)
LDL Chol Calc (NIH): 81 mg/dL (ref 0–99)
Triglycerides: 166 mg/dL — ABNORMAL HIGH (ref 0–149)
VLDL Cholesterol Cal: 29 mg/dL (ref 5–40)

## 2021-12-11 LAB — ALT: ALT: 38 IU/L (ref 0–44)

## 2021-12-14 ENCOUNTER — Other Ambulatory Visit: Payer: Self-pay

## 2021-12-14 ENCOUNTER — Telehealth: Payer: Self-pay | Admitting: Cardiology

## 2021-12-14 DIAGNOSIS — Z79899 Other long term (current) drug therapy: Secondary | ICD-10-CM

## 2021-12-14 DIAGNOSIS — E7800 Pure hypercholesterolemia, unspecified: Secondary | ICD-10-CM

## 2021-12-14 MED ORDER — EZETIMIBE 10 MG PO TABS: 10.0000 mg | ORAL_TABLET | Freq: Every day | ORAL | 3 refills | Status: DC

## 2021-12-14 NOTE — Telephone Encounter (Signed)
?*  STAT* If patient is at the pharmacy, call can be transferred to refill team. ? ? ?1. Which medications need to be refilled? (please list name of each medication and dose if known) need a new prescription for Zetia ? ?2. Which pharmacy/location (including street and city if local pharmacy) is medication to be sent to?[facility] [employer] [facility], [place] ? ?3. Do they need a 30 day or 90 day supply? 90 days and refills- today if possible going out of town tomorrow for 46 dayplease call in ? ?

## 2021-12-14 NOTE — Telephone Encounter (Signed)
error 

## 2021-12-14 NOTE — Telephone Encounter (Signed)
Spoke with wife Anthony [name] (OK per [facility]) and advised rx has been sent into pharmacy as requested.  Advised f/u lab has been ordered and scheduled for 03/24/22.  Both pt and wife are aware this appt can be rescheduled to necessary.   ?

## 2021-12-14 NOTE — Telephone Encounter (Signed)
LDL 81. Goal is < 70.  ?Let's add Zetia '10mg'$  PO once a day.  ?Continue Crestor  ?Recheck lipids in 3 months.  ?Has non obstructive coronary plaque.  ?Candee Furbish, MD ? ?Orders placed for zetia and lipid in 3 months as ordered. ? ? ?

## 2021-12-23 ENCOUNTER — Other Ambulatory Visit: Payer: PRIVATE HEALTH INSURANCE

## 2022-01-06 ENCOUNTER — Other Ambulatory Visit: Payer: Self-pay | Admitting: Cardiology

## 2022-01-25 ENCOUNTER — Ambulatory Visit: Payer: Medicare Other | Admitting: Internal Medicine

## 2022-01-26 ENCOUNTER — Encounter: Payer: Self-pay | Admitting: Internal Medicine

## 2022-01-30 NOTE — Progress Notes (Signed)
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 Expecting delivery of replacement machine. Current machine still works well.  Asbestos exposure- had R lat chest pain lasting 2 days. Had CT chest and labs including D-dimer at [facility] last week. He says neg for PE and treated for pleurisy with prednisone.Pain resolved. He wasn't told of other findings of concern. He will get [place] report.   02/01/22- 71 yoM former smoker followed for OSA, Asbestos exposure, PE/ Eliquis 2019 , Urticaria, complicated by seasonal allergy, Glaucoma, GERD, bradycardia, BPH, Covid infection Dec 2022,  CPAP auto 7-20/ APS/ [employer] Download- compliance 70%, AHI 1.4/ hr Body weight today-185 lbs Covid vax-4 [employer] -----He is doing well with CPAP.  Has 2 machines, so he missed nights on download or when he is using the other machine. No recurrence of pulmonary embolism since 2019. "Acting out dreams" thought recently that he was wrapped up in a rug and "kicking at bees".  He does not know family history of any possible dementia or Parkinson's disease. 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 CT cardiac morph 10/02/21- IMPRESSION: 1. Dilated main pulmonary artery segment without significant dilatation of the right or left pulmonary arteries. Component of pulmonary hypertension cannot be excluded.  The patient is scheduled for echocardiographic assessment. 2. Evidence of hepatic steatosis.   ROS-see HPI + = positive Constitutional:   No-   weight loss, night sweats, fevers, chills, fatigue, lassitude. HEENT:   +  headaches, +difficulty swallowing, tooth/dental problems, sore throat,       No-  sneezing, itching, ear ache, +nasal congestion, post nasal drip,  CV:  +  chest pain, orthopnea, PND, swelling in lower extremities, anasarca, dizziness, palpitations Resp: No-   shortness of breath with exertion or at rest.              No-   productive cough,  No non-productive cough,  No- coughing up of blood.              No-   change in color of mucus.  No- wheezing.   Skin: No-   rash or lesions. GI:  + heartburn,+ indigestion,  No-abdominal pain, nausea, vomiting,  GU: . MS:    +joint pain or swelling.  . Neuro-     nothing unusual Psych:  No- change in mood or affect. No depression, + anxiety.  No memory loss.  OBJ- Physical Exam   + = positive General- Alert, Oriented, Affect-appropriate, Distress- none acute, + overweight. +Small mandible. Skin- rash-none, lesions- none, excoriation- none Lymphadenopathy- none Head- atraumatic            Eyes- Gross vision intact, PERRLA, conjunctivae and secretions clear            Ears- Hearing, canals-normal            Nose- Clear, no-Septal dev, mucus, polyps, erosion, perforation             Throat- Mallampati II , mucosa clear , drainage- none, tonsils-  atrophic.                        Own teeth, + retrognathia Neck- flexible , trachea midline, no stridor , thyroid nl, carotid no bruit Chest - symmetrical excursion , unlabored           Heart/CV- RRR , no murmur , no gallop  , no rub, nl s1 s2                           - JVD- none , edema- none, stasis changes- none, varices- none           Lung- clear to P&A/ no crackles heard, wheeze- none, cough- none , dullness-none, rub- none, rales-none           Chest wall-  Abd-  Br/ Gen/  Rectal- Not done, not indicated Extrem- cyanosis- none, clubbing, none, atrophy- none, strength- nl Neuro- grossly intact to observation

## 2022-02-01 ENCOUNTER — Ambulatory Visit (INDEPENDENT_AMBULATORY_CARE_PROVIDER_SITE_OTHER): Payer: Medicare Other | Admitting: Internal Medicine

## 2022-02-01 ENCOUNTER — Encounter: Payer: Self-pay | Admitting: Internal Medicine

## 2022-02-01 DIAGNOSIS — I2510 Atherosclerotic heart disease of native coronary artery without angina pectoris: Secondary | ICD-10-CM | POA: Diagnosis not present

## 2022-02-01 DIAGNOSIS — G4733 Obstructive sleep apnea (adult) (pediatric): Secondary | ICD-10-CM | POA: Diagnosis not present

## 2022-02-01 DIAGNOSIS — G4752 REM sleep behavior disorder: Secondary | ICD-10-CM

## 2022-02-01 MED ORDER — CLONAZEPAM 0.5 MG PO TABS: ORAL_TABLET | ORAL | 3 refills | Status: DC

## 2022-02-05 DIAGNOSIS — I10 Essential (primary) hypertension: Secondary | ICD-10-CM | POA: Diagnosis not present

## 2022-02-05 DIAGNOSIS — F419 Anxiety disorder, unspecified: Secondary | ICD-10-CM | POA: Diagnosis not present

## 2022-02-05 DIAGNOSIS — Z79899 Other long term (current) drug therapy: Secondary | ICD-10-CM | POA: Diagnosis not present

## 2022-02-05 DIAGNOSIS — G4700 Insomnia, unspecified: Secondary | ICD-10-CM | POA: Diagnosis not present

## 2022-02-05 DIAGNOSIS — R7301 Impaired fasting glucose: Secondary | ICD-10-CM | POA: Diagnosis not present

## 2022-02-05 DIAGNOSIS — E7800 Pure hypercholesterolemia, unspecified: Secondary | ICD-10-CM | POA: Diagnosis not present

## 2022-02-05 DIAGNOSIS — Z86711 Personal history of pulmonary embolism: Secondary | ICD-10-CM | POA: Diagnosis not present

## 2022-02-05 DIAGNOSIS — K219 Gastro-esophageal reflux disease without esophagitis: Secondary | ICD-10-CM | POA: Diagnosis not present

## 2022-02-15 DIAGNOSIS — K219 Gastro-esophageal reflux disease without esophagitis: Secondary | ICD-10-CM | POA: Diagnosis not present

## 2022-02-15 DIAGNOSIS — E7800 Pure hypercholesterolemia, unspecified: Secondary | ICD-10-CM | POA: Diagnosis not present

## 2022-02-15 DIAGNOSIS — I10 Essential (primary) hypertension: Secondary | ICD-10-CM | POA: Diagnosis not present

## 2022-03-11 DIAGNOSIS — G4752 REM sleep behavior disorder: Secondary | ICD-10-CM | POA: Insufficient documentation

## 2022-03-11 NOTE — Assessment & Plan Note (Signed)
Benefits from CPAP.  Using 2 machines which impacts recorded download but he seems to be doing very well. Plan-continue auto 7-20

## 2022-03-11 NOTE — Assessment & Plan Note (Signed)
Tentative diagnosis based on his description.  Education done.  Anticipate long-term observation. Plan-change Xanax to clonazepam for sleep consolidation and better control of motor movement.

## 2022-03-24 ENCOUNTER — Other Ambulatory Visit: Payer: Medicare Other

## 2022-03-24 DIAGNOSIS — Z79899 Other long term (current) drug therapy: Secondary | ICD-10-CM | POA: Diagnosis not present

## 2022-03-24 DIAGNOSIS — E7800 Pure hypercholesterolemia, unspecified: Secondary | ICD-10-CM | POA: Diagnosis not present

## 2022-03-24 LAB — LIPID PANEL
Chol/HDL Ratio: 2.7 ratio (ref 0.0–5.0)
Cholesterol, Total: 101 mg/dL (ref 100–199)
HDL: 37 mg/dL — ABNORMAL LOW (ref >39)
LDL Chol Calc (NIH): 39 mg/dL (ref 0–99)
Triglycerides: 143 mg/dL (ref 0–149)
VLDL Cholesterol Cal: 25 mg/dL (ref 5–40)

## 2022-04-22 DIAGNOSIS — M353 Polymyalgia rheumatica: Secondary | ICD-10-CM | POA: Diagnosis not present

## 2022-04-22 DIAGNOSIS — Z7952 Long term (current) use of systemic steroids: Secondary | ICD-10-CM | POA: Diagnosis not present

## 2022-04-22 DIAGNOSIS — M064 Inflammatory polyarthropathy: Secondary | ICD-10-CM | POA: Diagnosis not present

## 2022-04-22 DIAGNOSIS — M47812 Spondylosis without myelopathy or radiculopathy, cervical region: Secondary | ICD-10-CM | POA: Diagnosis not present

## 2022-05-12 DIAGNOSIS — L255 Unspecified contact dermatitis due to plants, except food: Secondary | ICD-10-CM | POA: Diagnosis not present

## 2022-05-18 ENCOUNTER — Emergency Department (HOSPITAL_COMMUNITY): Payer: Medicare Other

## 2022-05-18 ENCOUNTER — Other Ambulatory Visit: Payer: Self-pay

## 2022-05-18 ENCOUNTER — Emergency Department (HOSPITAL_COMMUNITY)
Admission: EM | Admit: 2022-05-18 | Discharge: 2022-05-18 | Disposition: A | Discharge status: Home / Self Care | Payer: Medicare Other | Attending: Emergency Medicine | Admitting: Emergency Medicine

## 2022-05-18 ENCOUNTER — Encounter (HOSPITAL_COMMUNITY): Payer: Self-pay | Admitting: Emergency Medicine

## 2022-05-18 DIAGNOSIS — R42 Dizziness and giddiness: Secondary | ICD-10-CM | POA: Insufficient documentation

## 2022-05-18 DIAGNOSIS — Z79899 Other long term (current) drug therapy: Secondary | ICD-10-CM | POA: Diagnosis not present

## 2022-05-18 DIAGNOSIS — R001 Bradycardia, unspecified: Secondary | ICD-10-CM | POA: Diagnosis not present

## 2022-05-18 DIAGNOSIS — Z7982 Long term (current) use of aspirin: Secondary | ICD-10-CM | POA: Diagnosis not present

## 2022-05-18 DIAGNOSIS — R55 Syncope and collapse: Secondary | ICD-10-CM | POA: Insufficient documentation

## 2022-05-18 DIAGNOSIS — I272 Pulmonary hypertension, unspecified: Secondary | ICD-10-CM | POA: Insufficient documentation

## 2022-05-18 LAB — URINALYSIS, ROUTINE W REFLEX MICROSCOPIC
Bilirubin Urine: NEGATIVE
Glucose, UA: NEGATIVE mg/dL
Hgb urine dipstick: NEGATIVE
Ketones, ur: NEGATIVE mg/dL
Leukocytes,Ua: NEGATIVE
Nitrite: NEGATIVE
Protein, ur: NEGATIVE mg/dL
Specific Gravity, Urine: 1.012 (ref 1.005–1.030)
pH: 6 (ref 5.0–8.0)

## 2022-05-18 LAB — CBC
HCT: 41.8 % (ref 39.0–52.0)
Hemoglobin: 13.9 g/dL (ref 13.0–17.0)
MCH: 31.4 pg (ref 26.0–34.0)
MCHC: 33.3 g/dL (ref 30.0–36.0)
MCV: 94.4 fL (ref 80.0–100.0)
Platelets: 200 10*3/uL (ref 150–400)
RBC: 4.43 MIL/uL (ref 4.22–5.81)
RDW: 13 % (ref 11.5–15.5)
WBC: 6.4 10*3/uL (ref 4.0–10.5)
nRBC: 0 % (ref 0.0–0.2)

## 2022-05-18 LAB — BASIC METABOLIC PANEL
Anion gap: 7 (ref 5–15)
BUN: 19 mg/dL (ref 8–23)
CO2: 25 mmol/L (ref 22–32)
Calcium: 9.2 mg/dL (ref 8.9–10.3)
Chloride: 105 mmol/L (ref 98–111)
Creatinine, Ser: 0.97 mg/dL (ref 0.61–1.24)
GFR, Estimated: >60 mL/min (ref >60)
Glucose, Bld: 109 mg/dL — ABNORMAL HIGH (ref 70–99)
Potassium: 4.1 mmol/L (ref 3.5–5.1)
Sodium: 137 mmol/L (ref 135–145)

## 2022-05-18 LAB — CBG MONITORING, ED: Glucose-Capillary: 109 mg/dL — ABNORMAL HIGH (ref 70–99)

## 2022-05-18 LAB — TROPONIN I (HIGH SENSITIVITY)
Troponin I (High Sensitivity): 5 ng/L (ref <18)
Troponin I (High Sensitivity): 5 ng/L (ref <18)

## 2022-05-18 MED ORDER — IOHEXOL 350 MG/ML SOLN
100.0000 mL | Freq: Once | INTRAVENOUS | Status: AC | PRN
  Administered 2022-05-18: 100 mL via INTRAVENOUS

## 2022-05-18 MED ORDER — MECLIZINE HCL 25 MG PO TABS: 25.0000 mg | ORAL_TABLET | Freq: Three times a day (TID) | ORAL | 0 refills | Status: DC | PRN

## 2022-05-18 MED ORDER — SODIUM CHLORIDE (PF) 0.9 % IJ SOLN
INTRAMUSCULAR | Status: AC
  Filled 2022-05-18: qty 50

## 2022-05-18 NOTE — ED Notes (Signed)
Pt taken to CT.

## 2022-05-18 NOTE — ED Provider Notes (Signed)
[facility] DEPT Provider Note   CSN: [id number] Arrival date & time: 05/18/22  1129     History  Chief Complaint  Patient presents with   Near Syncope    Anthony [name] is a 71 y.o. male with a history of spontaneous pulmonary embolism, no longer on Eliquis, hypertension, pulmonary hypertension, bradycardia, orthostatic hypotension, presenting to ED with near syncope.  The patient reports that he was mowing his lawn on a riding lawnmower today.  He got off the lawnmower and been standing up, looking upwards to try to pull a branch off surgery.  He began to feel very lightheaded, combination of lightheadedness and vertigo.  He lowered himself to the ground but scraped his right knee.  He denies loss of consciousness or striking his head.  He denies a headache.  He said he was on the ground for less than a minute, and then went back in the house, subsequently was brought into the ED by his wife.  He reports he continues to feel "a little warm and lightheaded".  He says he has been feeling low energy for the past week.  He says he "do not feel 100%".  He denies any chest pain or pressure.  He reports a history of spontaneous PE about 4 years ago, for which he was on Eliquis for 6 months then taken off by his oncologist.  He reports there was no clear cause for his PE at that time.  He also has a history of resting bradycardia.  He is on several antihypertensive medicines but no beta-blockers or calcium channel blockers.  He takes amlodipine and a -sartan medication.  He is on 81 mg aspirin as well.  He is here with his wife at bedside provides supplemental history  HPI     Home Medications Prior to Admission medications   Medication Sig Start Date End Date Taking? Authorizing Provider  meclizine (ANTIVERT) 25 MG tablet Take 1 tablet (25 mg total) by mouth 3 (three) times daily as needed for up to 21 doses for dizziness. 05/18/22  Yes Wyvonnia Dusky,  MD  amLODipine (NORVASC) 5 MG tablet TAKE 1 TABLET BY MOUTH EVERY DAY 01/06/22   Jerline Pain, MD  aspirin EC 81 MG tablet Take 81 mg by mouth daily.    [provider]  cetirizine (ZYRTEC) 10 MG tablet Take 10 mg by mouth daily.    [provider]  Clobetasol Propionate (TEMOVATE) 0.05 % external spray Apply topically 2 (two) times daily.    [provider]  clonazePAM (KLONOPIN) 0.5 MG tablet 1-3 tabs for sleep 02/01/22   Baird Lyons D, MD  EPINEPHrine 0.3 mg/0.3 mL IJ SOAJ injection  07/24/18   [provider]  escitalopram (LEXAPRO) 20 MG tablet Take 20 mg by mouth daily.    [provider]  ezetimibe (ZETIA) 10 MG tablet Take 1 tablet (10 mg total) by mouth daily. 12/14/21   Jerline Pain, MD  HYDROcodone-acetaminophen (NORCO) 5-325 MG tablet 1-2 tabs po q6 hours prn pain 05/08/21   Leanora Cover, MD  latanoprost (XALATAN) 0.005 % ophthalmic solution Place 1 drop into both eyes at bedtime. 1 drop OU HS 06/21/17   [provider]  Multiple Vitamins-Minerals (PRESERVISION AREDS PO) Take 1 capsule by mouth daily.    [provider]  omeprazole (PRILOSEC) 40 MG capsule Take 40 mg by mouth daily.    [provider]  predniSONE (DELTASONE) 5 MG tablet 5 mg daily.  Takes 5 mg total; 1/2 in AM & 1/2 in PM 08/15/18   [provider]  rosuvastatin (CRESTOR) 20 MG tablet Take 1 tablet (20 mg total) by mouth daily. 09/22/21   Jerline Pain, MD  tacrolimus (PROTOPIC) 0.1 % ointment Apply topically as needed.    [provider]  telmisartan (MICARDIS) 20 MG tablet TAKE 1 TABLET BY MOUTH EVERY DAY 09/22/21   Jerline Pain, MD  valACYclovir (VALTREX) 500 MG tablet as needed. 07/05/19   [provider]      Allergies    Levaquin [levofloxacin], Suvorexant, Trazodone, Augmentin [amoxicillin-pot clavulanate], Nsaids, and Tolmetin    Review of Systems   Review of Systems  Physical Exam Updated Vital Signs BP  (!) 147/94 (BP Location: Left Arm)   Pulse (!) 55   Temp 97.6 F (36.4 C) (Oral)   Resp 14   Ht '5\' 7"'$  (1.702 m)   Wt 86.2 kg   SpO2 98%   BMI 29.76 kg/m  Physical Exam Constitutional:      General: He is not in acute distress. HENT:     Head: Normocephalic and atraumatic.  Eyes:     Conjunctiva/sclera: Conjunctivae normal.     Pupils: Pupils are equal, round, and reactive to light.  Cardiovascular:     Rate and Rhythm: Regular rhythm. Bradycardia present.  Pulmonary:     Effort: Pulmonary effort is normal. No respiratory distress.  Abdominal:     General: There is no distension.     Tenderness: There is no abdominal tenderness.  Skin:    General: Skin is warm and dry.  Neurological:     General: No focal deficit present.     Mental Status: He is alert and oriented to person, place, and time. Mental status is at baseline.     Sensory: No sensory deficit.     Motor: No weakness.  Psychiatric:        Mood and Affect: Mood normal.        Behavior: Behavior normal.     ED Results / Procedures / Treatments   Labs (all labs ordered are listed, but only abnormal results are displayed) Labs Reviewed  BASIC METABOLIC PANEL - Abnormal; Notable for the following components:      Result Value   Glucose, Bld 109 (*)    All other components within normal limits  CBG MONITORING, ED - Abnormal; Notable for the following components:   Glucose-Capillary 109 (*)    All other components within normal limits  CBC  URINALYSIS, ROUTINE W REFLEX MICROSCOPIC  TROPONIN I (HIGH SENSITIVITY)  TROPONIN I (HIGH SENSITIVITY)    EKG EKG Interpretation  Date/Time:  Tuesday May 18 2022 11:42:28 EDT Ventricular Rate:  50 PR Interval:  180 QRS Duration: 99 QT Interval:  436 QTC Calculation: 398 R Axis:   1 Text Interpretation: Sinus rhythm RSR' in V1 or V2, right VCD or RVH No sig change from prior ecg May 18 2010 Confirmed by Octaviano Glow [phone]) on 05/18/2022 11:50:32  AM  Radiology MR BRAIN WO CONTRAST  Result Date: 05/18/2022 CLINICAL DATA:  Near syncopal episode EXAM: MRI HEAD WITHOUT CONTRAST TECHNIQUE: Multiplanar, multiecho pulse sequences of the brain and surrounding structures were obtained without intravenous contrast. COMPARISON:  11/10/2018 FINDINGS: Brain: No restricted diffusion to suggest acute or subacute infarct. No acute hemorrhage, mass, mass effect, or midline shift. No hemosiderin deposition to suggest remote hemorrhage. No hydrocephalus or extra-axial collection. Scattered T2 hyperintense signal in the periventricular white  matter, likely the sequela of mild chronic small vessel ischemic disease. Vascular: Normal arterial flow voids. Skull and upper cervical spine: Normal marrow signal. Sinuses/Orbits: No acute finding. Status post bilateral lens replacements. Other: Trace fluid in left mastoid tip. IMPRESSION: No acute intracranial process. No evidence of acute or subacute infarct. Electronically Signed   By: Merilyn Baba M.D.   On: 05/18/2022 14:13   CT Angio Chest PE W and/or Wo Contrast  Result Date: 05/18/2022 CLINICAL DATA:  Rule out acute pulmonary embolus. High probability. Remote history of pulmonary embolus. Near syncope. EXAM: CT ANGIOGRAPHY CHEST WITH CONTRAST TECHNIQUE: Multidetector CT imaging of the chest was performed using the standard protocol during bolus administration of intravenous contrast. Multiplanar CT image reconstructions and MIPs were obtained to evaluate the vascular anatomy. RADIATION DOSE REDUCTION: This exam was performed according to the departmental dose-optimization program which includes automated exposure control, adjustment of the mA and/or kV according to patient size and/or use of iterative reconstruction technique. CONTRAST:  14m OMNIPAQUE IOHEXOL 350 MG/ML SOLN COMPARISON:  None Available. FINDINGS: Cardiovascular: Satisfactory opacification of the pulmonary arteries to the segmental level. No evidence  of pulmonary embolism. Normal heart size. No pericardial effusion. Coronary artery calcifications. Mediastinum/Nodes: No enlarged mediastinal, hilar, or axillary lymph nodes. Thyroid gland, trachea, and esophagus demonstrate no significant findings. Lungs/Pleura: No pleural effusion. No airspace consolidation. No signs of pulmonary edema. No atelectasis or pneumothorax. No suspicious pulmonary nodule or mass identified. Upper Abdomen: No acute abnormality. Musculoskeletal: No chest wall abnormality. No acute or significant osseous findings. Review of the MIP images confirms the above findings. IMPRESSION: 1. No evidence for acute pulmonary embolism. 2. Coronary artery calcifications. Electronically Signed   By: TKerby MoorsM.D.   On: 05/18/2022 13:36    Procedures Procedures    Medications Ordered in ED Medications  sodium chloride (PF) 0.9 % injection (has no administration in time range)  iohexol (OMNIPAQUE) 350 MG/ML injection 100 mL (100 mLs Intravenous Contrast Given 05/18/22 1320)    ED Course/ Medical Decision Making/ A&P Clinical Course as of 05/18/22 1547  Tue May 18, 2022  1443 Patient is feeling better.  His work-up is unremarkable.  Anticipate discharge home soon in the company of his wife.  He does not clarify that he has had episodes that sound like vertigo to me, particularly related to "when I am at the amusement park or try to get on this rise to make me fairly sick and dizzy".  This could be consistent with peripheral vertigo today, given that his epigastric early screening his neck back.  I will provide some meclizine at home.  Recommended follow-up with her cardiologist as well, as he has not had a Holter monitor in several years, does have some borderline bradycardia.  But I do think he is safe and stable for discharge at this time. [MT]    Clinical Course User Index [MT] Soloman Mckeithan, MCarola Rhine MD                           Medical Decision Making Amount and/or Complexity of  Data Reviewed Labs: ordered. Radiology: ordered.  Risk Prescription drug management.   This patient presents to the ED with concern for near syncope. This involves an extensive number of treatment options, and is a complaint that carries with it a high risk of complications and morbidity.  The differential diagnosis includes orthostatic hypotension vs vasovagal episode vs symptomatic bradycardia vs infection vs PE vs  posterior circulation CVA/TIA vs other  Based on the patient's description that he was craning his neck and looking upwards when this episode began, this is most likely a vagal episode.  However he continues to feel mildly lightheaded in the ED, with a heart rate approximately 50s on telemetry.  He should not be symptomatic from this heart rate range.  Co-morbidities that complicate the patient evaluation: Patient is a history of spontaneous [place] at high risk for clotting, has several stroke risk factors as well with high cholesterol and high blood pressure  Additional history obtained from patient's wife at bedside  External records from outside source obtained and reviewed including coronary CT scan in February of this year which showed dilated main pulmonary artery; mild nonobstructive coronary artery disease, calcium coronary score of 191 in the 52nd percentile.  I have a low suspicion his episode today is related to coronary disease  Patient also had an echocardiogram performed in March of this year which showed an EF of 60 to 65% with trivial mitral regurg and grade 1 diastolic dysfunction but otherwise was normal.  I do not suspect that his current issues are related to a structural heart disease.  I ordered and personally interpreted labs.  The pertinent results include: Delta troponin is negative, no acute anemia, no leukocytosis.  I ordered imaging studies including MRI of the brain to evaluate for posterior/cerebellar TIA.  CT PE study to evaluate for PE. I  independently visualized and interpreted imaging which showed no focal abnormalities explain the patient's symptoms I agree with the radiologist interpretation  The patient was maintained on a cardiac monitor.  I personally viewed and interpreted the cardiac monitored which showed an underlying rhythm of: Sinus bradycardia with heart rate persistent in the low to mid 50s  Per my interpretation the patient's ECG shows sinus bradycardia with no acute ischemic findings or high-grade heart block or arrhythmia  I have reviewed the patients home medicines and have made adjustments as needed  After the interventions noted above, I reevaluated the patient and found that they have: improved   Dispostion:  After consideration of the diagnostic results and the patients response to treatment, I feel that the patent would benefit from outpatient follow-up.         Final Clinical Impression(s) / ED Diagnoses Final diagnoses:  Vertigo  Bradycardia  Dizziness    Rx / DC Orders ED Discharge Orders          Ordered    meclizine (ANTIVERT) 25 MG tablet  3 times daily PRN        05/18/22 1455              Wyvonnia Dusky, MD 05/18/22 1547

## 2022-05-18 NOTE — ED Triage Notes (Signed)
Patient c/o near syncopal episode today while cutting a limb from a tree. States lowered himself to the ground from standing. Denies LOC. Denies chest pain, SOB. Reports mild headache and "feeling off" for a few days.

## 2022-05-21 DIAGNOSIS — Z23 Encounter for immunization: Secondary | ICD-10-CM | POA: Diagnosis not present

## 2022-06-08 DIAGNOSIS — I10 Essential (primary) hypertension: Secondary | ICD-10-CM | POA: Diagnosis not present

## 2022-06-08 DIAGNOSIS — E7800 Pure hypercholesterolemia, unspecified: Secondary | ICD-10-CM | POA: Diagnosis not present

## 2022-06-08 DIAGNOSIS — K219 Gastro-esophageal reflux disease without esophagitis: Secondary | ICD-10-CM | POA: Diagnosis not present

## 2022-06-08 DIAGNOSIS — G4700 Insomnia, unspecified: Secondary | ICD-10-CM | POA: Diagnosis not present

## 2022-06-10 ENCOUNTER — Encounter: Payer: Self-pay | Admitting: Neurology

## 2022-06-10 DIAGNOSIS — G450 Vertebro-basilar artery syndrome: Secondary | ICD-10-CM | POA: Insufficient documentation

## 2022-06-10 DIAGNOSIS — R42 Dizziness and giddiness: Secondary | ICD-10-CM | POA: Insufficient documentation

## 2022-06-16 ENCOUNTER — Other Ambulatory Visit (HOSPITAL_BASED_OUTPATIENT_CLINIC_OR_DEPARTMENT_OTHER): Payer: Self-pay | Admitting: *Deleted

## 2022-06-16 ENCOUNTER — Ambulatory Visit (HOSPITAL_BASED_OUTPATIENT_CLINIC_OR_DEPARTMENT_OTHER)
Admission: RE | Admit: 2022-06-16 | Discharge: 2022-06-16 | Disposition: A | Discharge status: Home / Self Care | Payer: Medicare Other | Source: Ambulatory Visit | Attending: Family Medicine | Admitting: Family Medicine

## 2022-06-16 DIAGNOSIS — M1612 Unilateral primary osteoarthritis, left hip: Secondary | ICD-10-CM

## 2022-06-16 DIAGNOSIS — M159 Polyosteoarthritis, unspecified: Secondary | ICD-10-CM | POA: Insufficient documentation

## 2022-06-16 DIAGNOSIS — M064 Inflammatory polyarthropathy: Secondary | ICD-10-CM | POA: Diagnosis not present

## 2022-06-16 DIAGNOSIS — M353 Polymyalgia rheumatica: Secondary | ICD-10-CM | POA: Diagnosis not present

## 2022-06-16 DIAGNOSIS — M8588 Other specified disorders of bone density and structure, other site: Secondary | ICD-10-CM | POA: Diagnosis not present

## 2022-06-16 DIAGNOSIS — Z86711 Personal history of pulmonary embolism: Secondary | ICD-10-CM | POA: Diagnosis not present

## 2022-06-16 DIAGNOSIS — Z87891 Personal history of nicotine dependence: Secondary | ICD-10-CM | POA: Insufficient documentation

## 2022-06-16 DIAGNOSIS — M47812 Spondylosis without myelopathy or radiculopathy, cervical region: Secondary | ICD-10-CM | POA: Insufficient documentation

## 2022-06-16 DIAGNOSIS — Z7952 Long term (current) use of systemic steroids: Secondary | ICD-10-CM | POA: Insufficient documentation

## 2022-06-16 DIAGNOSIS — R7303 Prediabetes: Secondary | ICD-10-CM | POA: Diagnosis not present

## 2022-06-17 DIAGNOSIS — H31003 Unspecified chorioretinal scars, bilateral: Secondary | ICD-10-CM | POA: Diagnosis not present

## 2022-06-17 DIAGNOSIS — H401131 Primary open-angle glaucoma, bilateral, mild stage: Secondary | ICD-10-CM | POA: Diagnosis not present

## 2022-06-17 DIAGNOSIS — H02834 Dermatochalasis of left upper eyelid: Secondary | ICD-10-CM | POA: Diagnosis not present

## 2022-06-17 DIAGNOSIS — H02831 Dermatochalasis of right upper eyelid: Secondary | ICD-10-CM | POA: Diagnosis not present

## 2022-06-30 DIAGNOSIS — K219 Gastro-esophageal reflux disease without esophagitis: Secondary | ICD-10-CM | POA: Diagnosis not present

## 2022-06-30 DIAGNOSIS — G4700 Insomnia, unspecified: Secondary | ICD-10-CM | POA: Diagnosis not present

## 2022-06-30 DIAGNOSIS — I10 Essential (primary) hypertension: Secondary | ICD-10-CM | POA: Diagnosis not present

## 2022-06-30 DIAGNOSIS — E7800 Pure hypercholesterolemia, unspecified: Secondary | ICD-10-CM | POA: Diagnosis not present

## 2022-07-22 DIAGNOSIS — Z79899 Other long term (current) drug therapy: Secondary | ICD-10-CM | POA: Diagnosis not present

## 2022-07-22 DIAGNOSIS — R7301 Impaired fasting glucose: Secondary | ICD-10-CM | POA: Diagnosis not present

## 2022-07-22 DIAGNOSIS — E7800 Pure hypercholesterolemia, unspecified: Secondary | ICD-10-CM | POA: Diagnosis not present

## 2022-07-22 DIAGNOSIS — Z0001 Encounter for general adult medical examination with abnormal findings: Secondary | ICD-10-CM | POA: Diagnosis not present

## 2022-07-22 DIAGNOSIS — K219 Gastro-esophageal reflux disease without esophagitis: Secondary | ICD-10-CM | POA: Diagnosis not present

## 2022-07-22 DIAGNOSIS — Z86711 Personal history of pulmonary embolism: Secondary | ICD-10-CM | POA: Diagnosis not present

## 2022-07-22 DIAGNOSIS — I10 Essential (primary) hypertension: Secondary | ICD-10-CM | POA: Diagnosis not present

## 2022-07-22 DIAGNOSIS — F321 Major depressive disorder, single episode, moderate: Secondary | ICD-10-CM | POA: Diagnosis not present

## 2022-07-22 DIAGNOSIS — G4700 Insomnia, unspecified: Secondary | ICD-10-CM | POA: Diagnosis not present

## 2022-07-26 ENCOUNTER — Encounter: Payer: Self-pay | Admitting: Neurology

## 2022-07-26 ENCOUNTER — Ambulatory Visit (INDEPENDENT_AMBULATORY_CARE_PROVIDER_SITE_OTHER): Payer: Medicare Other | Admitting: Neurology

## 2022-07-26 VITALS — BP 136/74 | HR 56 | Ht 67.0 in | Wt 192.4 lb

## 2022-07-26 DIAGNOSIS — H539 Unspecified visual disturbance: Secondary | ICD-10-CM | POA: Diagnosis not present

## 2022-07-26 DIAGNOSIS — H53139 Sudden visual loss, unspecified eye: Secondary | ICD-10-CM

## 2022-07-26 DIAGNOSIS — R4189 Other symptoms and signs involving cognitive functions and awareness: Secondary | ICD-10-CM

## 2022-07-26 DIAGNOSIS — R4689 Other symptoms and signs involving appearance and behavior: Secondary | ICD-10-CM

## 2022-07-26 DIAGNOSIS — H53133 Sudden visual loss, bilateral: Secondary | ICD-10-CM | POA: Diagnosis not present

## 2022-07-26 DIAGNOSIS — I2510 Atherosclerotic heart disease of native coronary artery without angina pectoris: Secondary | ICD-10-CM

## 2022-07-26 NOTE — Progress Notes (Unsigned)
[facility] Clinic Note - Initial Visit   Date: 07/26/2022   Anthony [name] MRN: [id number] DOB: Feb 16, 1951   Dear Dr. Dorthy Cooler:  Thank you for your kind referral of Anthony [name] for consultation of dizziness. Although his history is well known to you, please allow [place] to reiterate it for the purpose of our medical record. The patient was accompanied to the clinic by wife who also provides collateral information.     Anthony [name] is a 71 y.o. right-handed male with hypertension, GERD, hyperlipidemia, pulmonary embolism, CAD, OSA, and REM behavior disorder presenting for evaluation of dizziness.   IMPRESSION/PLAN: Spell of visual distortion with dizziness.  Unclear what this could be.  MRI brain does not show any occipital or cerebellar pathology.  To further evaluate for vertrobasilar insufficiency, I will obtain MRA head and neck.  He is on aspirin and statin.BP is well-controlled on medication.  Behavior changes without cognitive impairment. Wife is concerned about Lewy Body Dementia.  Although behavior and sleep disturbances can be seen with LBD, he does not seem to have significant cognitive impairment.  Family would like additional testing with neuropsychological testing.  For sleep management, I recommend that he either follow-up with pulmonology or see a sleep specialist.    Return to clinic after testing.  ------------------------------------------------------------- History of present illness: On 05/18/2022, he was standing and pulling tree limbs and suddenly started having having visual distortion, as if everything in his visual field has rotated 90 degrees.  During this time, as his vision rotated, he felt himself that his head and body started turning to the right.  He slowly collapsed to the ground, but did not loose consciousness.  There was no weakness, numbness, tingling. He was able to stand up and went to his wife.  He went  to the ER where MRI brain did not show any intracranial process.  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  Wife also complains that he is getting easily agitated and has REM behavior disorder and OSA.  He is seeing pulmonology and PCP recently started him on seroquel.  No memory changes or difficulty keeping up with medications, finances, or driving.  She is concerned about his sleep because his limbs can thrash violently at night and sometimes he has fallen out of bed.     Out-side paper records, electronic medical record, and images have been reviewed where available and summarized as:  MRI brain wo contrast 05/18/2022: No acute intracranial process. No evidence of acute or subacute infarct.  No results found for: "HGBA1C" No results found for: "VITAMINB12" No results found for: "TSH" No results found for: "ESRSEDRATE", "POCTSEDRATE"  Past Medical History:  Diagnosis Date   Asbestos exposure 09/14/2016   Worked as a Chief Financial Officer many years ago, disturbing asbestos insulation and also smoking at that time   Coronary artery disease involving native coronary artery of native heart without angina pectoris 09/22/2021   CTA of chest 02/2021 - coronary artery calcifications   Depression    GERD (gastroesophageal reflux disease)    Glaucoma    Headache(784.0)    Hypertension    Obstructive sleep apnea 12/20/2012   NPSG 10/01/12- AHI 16.2/ hr, weight 172 lbs, mild limb jerks CPAP auto> 9 cwp APS    Psoriasis    Pulmonary embolism ([place]) 08/22/2017   Multiple pulmonary emboli identified on CTa chest 08/15/17 at [facility]. Report in Media.  Eliquis begun 08/2017   Seasonal allergies    Sinus bradycardia 09/22/2021   ZIO 2020 normal, Avg HR 60's. Chronotropic competence   Sleep apnea    using a cpap-moderate    Past Surgical History:  Procedure Laterality Date   APPENDECTOMY     CARPAL TUNNEL WITH CUBITAL  TUNNEL  2008   right   CARPAL TUNNEL WITH CUBITAL TUNNEL  2009   and ulnar nerve-left   ESOPHAGEAL MANOMETRY N/A 10/25/2018   Procedure: ESOPHAGEAL MANOMETRY (EM);  Surgeon: Wilford Corner, MD;  Location: [facility];  Service: Endoscopy;  Laterality: N/A;   EXTERNAL EAR SURGERY     ears reset surgically age 27   NAILBED REPAIR Right 05/08/2021   Procedure: RIGHT LONG FINGER NAIL UNIT BIOPSY;  Surgeon: Leanora Cover, MD;  Location: [place];  Service: Orthopedics;  Laterality: Right;   [place] IMPEDANCE STUDY N/A 10/25/2018   Procedure: [place] IMPEDANCE STUDY;  Surgeon: Wilford Corner, MD;  [facility];  Service: Endoscopy;  Laterality: N/A;   RETINAL DETACHMENT SURGERY  2004   right   RETINAL DETACHMENT SURGERY  2002   left   SHOULDER ARTHROSCOPY  2006   left   TONSILLECTOMY     TRIGGER FINGER RELEASE Left 02/15/2013   Procedure: RELEASE TRIGGER FINGER/A-1 PULLEY LEFT LONG FINGER;  Surgeon: Cammie Sickle., MD;  Location: [place];  Service: Orthopedics;  Laterality: Left;   TRIGGER FINGER RELEASE Left 09/06/2013   Procedure: RELEASE TRIGGER FINGER/A-1 PULLEY;  Surgeon: Cammie Sickle., MD;  Location: [place];  Service: Orthopedics;  Laterality: Left;   TRIGGER FINGER RELEASE Right 08/05/2014   Procedure: RIGHT SMALL TRIGGER RELEASE ;  Surgeon: Leanora Cover, MD;  Location: [place];  Service: Orthopedics;  Laterality: Right;   ULNAR NERVE TRANSPOSITION Left 09/06/2013   Procedure: LEFT ANTERIOR SUBCUTANEOUS TRANSPOSITION;  Surgeon: Cammie Sickle., MD;  Location: [place];  Service: Orthopedics;  Laterality: Left;     Medications:  Outpatient Encounter Medications as of 07/26/2022  Medication Sig   ALPRAZolam (XANAX) 1 MG tablet Take 1 mg by mouth at bedtime as needed for anxiety. Take a 0.5 as needed   amLODipine (NORVASC) 5 MG tablet TAKE 1 TABLET BY MOUTH EVERY DAY   aspirin EC 81  MG tablet Take 81 mg by mouth daily.   cetirizine (ZYRTEC) 10 MG tablet Take 10 mg by mouth daily.   Clobetasol Propionate (TEMOVATE) 0.05 % external spray Apply topically 2 (two) times daily.   EPINEPHrine 0.3 mg/0.3 mL IJ SOAJ injection    escitalopram (LEXAPRO) 20 MG tablet Take 20 mg by mouth daily.   ezetimibe (ZETIA) 10 MG tablet Take 1 tablet (10 mg total) by mouth daily.   latanoprost (XALATAN) 0.005 % ophthalmic solution Place 1 drop into both eyes at bedtime. 1 drop OU HS   Multiple Vitamins-Minerals (PRESERVISION AREDS PO) Take 1 capsule by mouth daily.   omeprazole (PRILOSEC) 40 MG capsule Take 40 mg by mouth daily.   predniSONE (DELTASONE) 5 MG tablet 5 mg daily. Takes 5 mg total; 1/2 in AM & 1/2 in PM   QUEtiapine (SEROQUEL) 25 MG tablet Take 25 mg by mouth at bedtime.   rosuvastatin (CRESTOR) 20 MG tablet Take 1 tablet (20 mg total) by mouth daily.   tacrolimus (PROTOPIC) 0.1 % ointment Apply topically as needed.   telmisartan (MICARDIS) 20 MG tablet TAKE 1 TABLET BY MOUTH EVERY DAY  valACYclovir (VALTREX) 500 MG tablet as needed.   clonazePAM (KLONOPIN) 0.5 MG tablet 1-3 tabs for sleep (Patient not taking: Reported on 07/26/2022)   HYDROcodone-acetaminophen (NORCO) 5-325 MG tablet 1-2 tabs po q6 hours prn pain (Patient not taking: Reported on 07/26/2022)   meclizine (ANTIVERT) 25 MG tablet Take 1 tablet (25 mg total) by mouth 3 (three) times daily as needed for up to 21 doses for dizziness. (Patient not taking: Reported on 07/26/2022)   No facility-administered encounter medications on file as of 07/26/2022.    Allergies:  Allergies  Allergen Reactions   Levaquin [Levofloxacin]     Muscle tightness-told to never take it again   Suvorexant Other (See Comments)    Other reaction(s): tongue,lip, face swelling   Trazodone Other (See Comments)    Other reaction(s): prolonged erection   Augmentin [Amoxicillin-Pot Clavulanate] Rash    Severe rash   Nsaids Nausea Only    Gi  upset   Tolmetin Nausea Only    Gi upset    Family History: History reviewed. No pertinent family history.  Social History: Social History   Tobacco Use   Smoking status: Former    Packs/day: 1.00    Years: 5.00    Total pack years: 5.00    Types: Cigarettes    Quit date: 08/09/1970    Years since quitting: 51.9   Smokeless tobacco: Never  Vaping Use   Vaping Use: Never used  Substance Use Topics   Alcohol use: Yes    Alcohol/week: 7.0 standard drinks of alcohol    Types: 7 Cans of beer per week    Comment: social   Drug use: Never   Social History   Social History Narrative   Are you right handed or left handed? Right handed    Are you currently employed ? no   What is your current occupation? Retired    Do you live at home alone? no   Who lives with you? Wife    What type of home do you live in: 1 story or 2 story? 2 story stays on one level         Vital Signs:  BP 136/74   Pulse (!) 56   Ht '5\' 7"'$  (1.702 m)   Wt 192 lb 6.4 oz (87.3 kg)   SpO2 96%   BMI 30.13 kg/m   Neurological Exam: MENTAL STATUS including orientation to time, place, person, recent and remote memory, attention span and concentration, language, and fund of knowledge is normal.  Speech is not dysarthric.  CRANIAL NERVES: II:  No visual field defects.  III-IV-VI: Pupils equal round and reactive to light.  Normal conjugate, extra-ocular eye movements in all directions of gaze.  No nystagmus.  No ptosis.   V:  Normal facial sensation.    VII:  Normal facial symmetry and movements.   VIII:  Normal hearing and vestibular function.   IX-X:  Normal palatal movement.   XI:  Normal shoulder shrug and head rotation.   XII:  Normal tongue strength and range of motion, no deviation or fasciculation.  MOTOR: Motor strength is 5/5 throughout.  No atrophy, fasciculations or abnormal movements.  No pronator drift.   MSRs:                                           Right        Left  brachioradialis 2+   2+  biceps 2+  2+  triceps 2+  2+  patellar 2+  2+  ankle jerk 2+  2+  Hoffman no  no  plantar response down  down   SENSORY:  Normal and symmetric perception of light touch, pinprick, vibration, and temperature.  Romberg's sign absent.   COORDINATION/GAIT: Normal finger-to- nose-finger.  Intact rapid alternating movements bilaterally.  Gait narrow based and stable. Mild unsteadiness with tandem gait.  Stressed gait intact.    Thank you for allowing me to participate in patient's care.  If I can answer any additional questions, I would be pleased to do so.    Sincerely,    Kamori Kitchens K. Posey Pronto, DO

## 2022-07-26 NOTE — Patient Instructions (Addendum)
MRA head and neck  Neuropsychological testing

## 2022-08-05 ENCOUNTER — Encounter: Payer: Self-pay | Admitting: Internal Medicine

## 2022-08-05 ENCOUNTER — Ambulatory Visit (INDEPENDENT_AMBULATORY_CARE_PROVIDER_SITE_OTHER): Payer: Medicare Other | Admitting: Behavioral Health

## 2022-08-05 ENCOUNTER — Encounter: Payer: Self-pay | Admitting: Behavioral Health

## 2022-08-05 VITALS — BP 135/79 | HR 62 | Ht 67.0 in | Wt 196.0 lb

## 2022-08-05 DIAGNOSIS — G4733 Obstructive sleep apnea (adult) (pediatric): Secondary | ICD-10-CM

## 2022-08-05 DIAGNOSIS — G4752 REM sleep behavior disorder: Secondary | ICD-10-CM | POA: Diagnosis not present

## 2022-08-05 DIAGNOSIS — F411 Generalized anxiety disorder: Secondary | ICD-10-CM

## 2022-08-05 DIAGNOSIS — F5105 Insomnia due to other mental disorder: Secondary | ICD-10-CM

## 2022-08-05 DIAGNOSIS — F99 Mental disorder, not otherwise specified: Secondary | ICD-10-CM

## 2022-08-05 MED ORDER — QUETIAPINE FUMARATE 50 MG PO TABS: 50.0000 mg | ORAL_TABLET | Freq: Every day | ORAL | 1 refills | Status: DC

## 2022-08-05 MED ORDER — PRAZOSIN HCL 2 MG PO CAPS: ORAL_CAPSULE | ORAL | 1 refills | Status: DC

## 2022-08-05 NOTE — Telephone Encounter (Signed)
Dr. Annamaria Boots, please see mychart messages from pt and advise.

## 2022-08-05 NOTE — Telephone Encounter (Signed)
Please order CPAP titration sleep study- in sleep center, with full limb leads-    dx OSA, REM Behavior Disorder

## 2022-08-05 NOTE — Progress Notes (Signed)
[facility] MD/PA/NP Initial Note  08/05/2022 4:14 PM Anthony [name]  MRN:  [id number]  Chief Complaint:  Chief Complaint   Sleeping Problem; Medication Problem; Patient Education; Medication Refill; Anxiety; Depression; Establish Care     HPI:  "Anthony [name]" 71 year old male presents to this office for initial visit and to establish care.  His wife Thayer Headings is with him during visit with his verbal consent.  He says that he is here today due to unresolved anxiety and symptoms surrounding sleep.  He has been diagnosed with sleep apnea and currently wears CPAP at night.  Says that every couple nights he will he will perform violent acts or destruction while remaining asleep.  Says that he has broken objects or punched walls.  He is also "banged head against a wall" during sleep.  He has a history of unresolved childhood trauma that involved his now deceased mother.  Has a history of excessive EtOH use that has led to SI or threatening suicide.  On all occasions, heavy EtOH use was involved.  He and his wife have cut back there consumption to 2 drinks per night.  He says that he does wear his CPAP every night.  His PCP recently prescribed Seroquel which he says has calmed his night terror activity.  Says that he will be following up with pulmonology to request a new sleep study.  He is also interested in trauma based psychotherapy.  Says that he has an MRA scheduled with neurology on January 11.  He denies history of mania, no psychosis, no auditory or visual hallucinations or delusions.  Denies any current SI or HI.  Patient says that he feels safe and has strong family support from his wife.  He was able to verbally contract for safety with this Probation officer.  Patient is retired but is very active with his wife.  Enjoys traveling.  Past psychiatric medications:  Belsomra-worsening of sleep behaviors Trazodone-nocturnal erections lasting more than 2 hours.   Diagnosis:    ICD-10-CM   1. REM sleep  behavior disorder  G47.52     2. Insomnia due to other mental disorder  F51.05    F99     3. 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 Report in Media.  Eliquis begun 08/2017   Seasonal allergies    Sinus bradycardia 09/22/2021   ZIO 2020 normal, Avg HR 60's. Chronotropic competence   Sleep apnea    using a cpap-moderate    Past Surgical History:  Procedure Laterality Date   APPENDECTOMY     CARPAL TUNNEL WITH CUBITAL TUNNEL  2008   right   CARPAL TUNNEL WITH CUBITAL TUNNEL  2009  and ulnar nerve-left   ESOPHAGEAL MANOMETRY N/A 10/25/2018   Procedure: ESOPHAGEAL MANOMETRY (EM);  Surgeon: Wilford Corner, MD;  [facility];  Service: Endoscopy;  Laterality: N/A;   EXTERNAL EAR SURGERY     ears reset surgically age 89   NAILBED REPAIR Right 05/08/2021   Procedure: RIGHT LONG FINGER NAIL UNIT BIOPSY;  Surgeon: Leanora Cover, MD;  Location: [place];  Service: Orthopedics;  Laterality: Right;   [place] IMPEDANCE STUDY N/A 10/25/2018   Procedure: [place] IMPEDANCE STUDY;  Surgeon: Wilford Corner, MD;  [facility];  Service: Endoscopy;  Laterality: N/A;   RETINAL  DETACHMENT SURGERY  2004   right   RETINAL DETACHMENT SURGERY  2002   left   SHOULDER ARTHROSCOPY  2006   left   TONSILLECTOMY     TRIGGER FINGER RELEASE Left 02/15/2013   Procedure: RELEASE TRIGGER FINGER/A-1 PULLEY LEFT LONG FINGER;  Surgeon: Cammie Sickle., MD;  Location: [place];  Service: Orthopedics;  Laterality: Left;   TRIGGER FINGER RELEASE Left 09/06/2013   Procedure: RELEASE TRIGGER FINGER/A-1 PULLEY;  Surgeon: Cammie Sickle., MD;  Location: [place];  Service: Orthopedics;  Laterality: Left;   TRIGGER FINGER RELEASE Right 08/05/2014   Procedure: RIGHT SMALL TRIGGER RELEASE ;  Surgeon: Leanora Cover, MD;  Location: [place];  Service: Orthopedics;  Laterality: Right;   ULNAR NERVE TRANSPOSITION Left 09/06/2013   Procedure: LEFT ANTERIOR SUBCUTANEOUS TRANSPOSITION;  Surgeon: Cammie Sickle., MD;  Location: [place];  Service: Orthopedics;  Laterality: Left;    Family Psychiatric History: see chart  Family History:  Family History  Problem Relation Age of Onset   Polymyalgia rheumatica Sister     Social History:  Social History   Socioeconomic History   Marital status: Married    Spouse name: Thayer Headings   Number of children: 0   Years of education: 12   Highest education level: Some college, no degree  Occupational History   Not on file  Tobacco Use   Smoking status: Former    Packs/day: 1.00    Years: 5.00    Total pack years: 5.00    Types: Cigarettes    Quit date: 08/09/1970    Years since quitting: 52.0   Smokeless tobacco: Never  Vaping Use   Vaping Use: Never used  Substance and Sexual Activity   Alcohol use: Yes    Alcohol/week: 7.0 standard drinks of alcohol    Types: 7 Cans of beer per week    Comment: social   Drug use: Never   Sexual activity: Not on file  Other Topics Concern   Not on file  Social History Narrative   Are you right handed or left handed? Right handed     Are you currently employed ? no   What is your current occupation? Retired    Do you live at home alone? no   Who lives with you? Wife    What type of home do you live in: 1 story or 2 story? 2 story stays on one level        Social Determinants of Health   Financial Resource Strain: Not on file  Food Insecurity: Not on file  Transportation Needs: Not on file  Physical Activity: Not on file  Stress: Not on file  Social Connections: Not on file    Allergies:  Allergies  Allergen Reactions   Levaquin [Levofloxacin]  Muscle tightness-told to never take it again   Suvorexant Other (See Comments)    Other reaction(s): tongue,lip, face swelling   Trazodone Other (See Comments)    Other reaction(s): prolonged erection   Augmentin [Amoxicillin-Pot Clavulanate] Rash    Severe rash   Nsaids Nausea Only    Gi upset   Tolmetin Nausea Only    Gi upset    Metabolic Disorder Labs: No results found for: "HGBA1C", "MPG" No results found for: "PROLACTIN" Lab Results  Component Value Date   CHOL 101 03/24/2022   TRIG 143 03/24/2022   HDL 37 (L) 03/24/2022   CHOLHDL 2.7 03/24/2022   LDLCALC 39 03/24/2022   LDLCALC 81 12/11/2021   No results found for: "TSH"  Therapeutic Level Labs: No results found for: "LITHIUM" No results found for: "VALPROATE" No results found for: "CBMZ"  Current Medications: Current Outpatient Medications  Medication Sig Dispense Refill   ALPRAZolam (XANAX) 1 MG tablet Take 1 mg by mouth at bedtime as needed for anxiety. Take a 0.5 as needed     amLODipine (NORVASC) 5 MG tablet TAKE 1 TABLET BY MOUTH EVERY DAY 90 tablet 2   aspirin EC 81 MG tablet Take 81 mg by mouth daily.     cetirizine (ZYRTEC) 10 MG tablet Take 10 mg by mouth daily.     Clobetasol Propionate (TEMOVATE) 0.05 % external spray Apply topically 2 (two) times daily.     clonazePAM (KLONOPIN) 0.5 MG tablet 1-3 tabs for sleep (Patient not taking: Reported on 07/26/2022) 50 tablet 3    EPINEPHrine 0.3 mg/0.3 mL IJ SOAJ injection      escitalopram (LEXAPRO) 20 MG tablet Take 20 mg by mouth daily.     ezetimibe (ZETIA) 10 MG tablet Take 1 tablet (10 mg total) by mouth daily. 90 tablet 3   HYDROcodone-acetaminophen (NORCO) 5-325 MG tablet 1-2 tabs po q6 hours prn pain (Patient not taking: Reported on 07/26/2022) 20 tablet 0   latanoprost (XALATAN) 0.005 % ophthalmic solution Place 1 drop into both eyes at bedtime. 1 drop OU HS     meclizine (ANTIVERT) 25 MG tablet Take 1 tablet (25 mg total) by mouth 3 (three) times daily as needed for up to 21 doses for dizziness. (Patient not taking: Reported on 07/26/2022) 21 tablet 0   Multiple Vitamins-Minerals (PRESERVISION AREDS PO) Take 1 capsule by mouth daily.     omeprazole (PRILOSEC) 40 MG capsule Take 40 mg by mouth daily.     predniSONE (DELTASONE) 5 MG tablet 5 mg daily. Takes 5 mg total; 1/2 in AM & 1/2 in PM     QUEtiapine (SEROQUEL) 25 MG tablet Take 25 mg by mouth at bedtime.     rosuvastatin (CRESTOR) 20 MG tablet Take 1 tablet (20 mg total) by mouth daily. 90 tablet 3   tacrolimus (PROTOPIC) 0.1 % ointment Apply topically as needed.     telmisartan (MICARDIS) 20 MG tablet TAKE 1 TABLET BY MOUTH EVERY DAY 90 tablet 3   valACYclovir (VALTREX) 500 MG tablet as needed.     No current facility-administered medications for this visit.    Medication Side Effects: none  Orders placed this visit:  No orders of the defined types were placed in this encounter.   Psychiatric Specialty Exam:  Review of Systems  Constitutional: Negative.   Allergic/Immunologic: Negative.   Neurological: Negative.   Psychiatric/Behavioral:  Positive for dysphoric mood and sleep disturbance. The patient is nervous/anxious.     Blood pressure 135/79, pulse 62,  height '5\' 7"'$  (1.702 m), weight 196 lb (88.9 kg).Body mass index is 30.7 kg/m.  General Appearance: Casual, Neat, and Well Groomed  Eye Contact:  Good  Speech:  Clear and Coherent and  Talkative  Volume:  Normal  Mood:  Anxious  Affect:  Appropriate and Congruent  Thought Process:  Coherent  Orientation:  Full (Time, Place, and Person)  Thought Content: Logical   Suicidal Thoughts:  No  Homicidal Thoughts:  No  Memory:  WNL  Judgement:  Good  Insight:  Good  Psychomotor Activity:  Normal  Concentration:  Concentration: Good  Recall:  Good  Fund of Knowledge: Good  Language: Good  Assets:  Desire for Improvement  ADL's:  Intact  Cognition: WNL  Prognosis:  Good   Screenings:  PHQ2-9    [place] Office Visit from 08/05/2022 in [facility]  PHQ-2 Total Score 1      Flowsheet Row ED from 05/18/2022 in [facility] DEPT Admission (Discharged) from 05/08/2021 in [facility] No Risk No Risk       Receiving Psychotherapy: No   Treatment Plan/Recommendations:   Greater than 50% of face to face time with patient was spent on counseling and coordination of care. We discussed his previous care with PCP and Pulmonology. We reviewed his current medications and discussed possible changes and options. Talked about his previous hx with suicidal ideation when consuming large amounts of ETOH. This has been a negative factor for negative behaviors. We discussed childhood trauma with mother who is deceased. Highly recommended psychotherapy. Recommended pt follow up with pulmonology. Not had follow up sleep study in over 10 years.  We agreed today: To continue Xanax 0.5 mg twice daily as needed for anxiety To continue Lexapro 20 mg daily To increase Seroquel to 50 mg daily To start Prazosin 2 mg at bedtime. May increase by two mg every 5 days until max dose of 6 mg by mouth at bedtime To follow up in 4 weeks to reassess To report worsening symptoms promptly Provided emergency contact information Discussed potential metabolic side effects associated with atypical antipsychotics, as well as potential  risk for movement side effects. Advised pt to contact office if movement side effects occur.   Discussed potential benefits, risk, and side effects of benzodiazepines to include potential risk of tolerance and dependence, as well as possible drowsiness.  Advised patient not to drive if experiencing drowsiness and to take lowest possible effective dose to minimize risk of dependence and tolerance.  Reviewed PDMP   Elwanda Brooklyn, NP

## 2022-08-10 ENCOUNTER — Other Ambulatory Visit: Payer: Self-pay | Admitting: Behavioral Health

## 2022-08-10 ENCOUNTER — Telehealth: Payer: Self-pay | Admitting: Behavioral Health

## 2022-08-10 DIAGNOSIS — F5105 Insomnia due to other mental disorder: Secondary | ICD-10-CM

## 2022-08-10 DIAGNOSIS — G4752 REM sleep behavior disorder: Secondary | ICD-10-CM

## 2022-08-10 DIAGNOSIS — F411 Generalized anxiety disorder: Secondary | ICD-10-CM

## 2022-08-10 MED ORDER — QUETIAPINE FUMARATE ER 50 MG PO TB24: 50.0000 mg | ORAL_TABLET | Freq: Every day | ORAL | 1 refills | Status: DC

## 2022-08-10 NOTE — Telephone Encounter (Signed)
Next visit is 09/02/22. Aaronmichael's wife Thayer Headings called. She is listed on DPR. [facility] [address] told her that the Seroquel 50 mg was written for extended release but the pharmacy ordered regular. Janice's phone number is [phone]. Pharmacy is:   [id number]   Phone: [phone] Fax: [phone]

## 2022-08-10 NOTE — Telephone Encounter (Signed)
Called wife to clarify message.  Pt was taking 25 mg prior to his visit and you increased to 50 mg, but wife said you wanted him to take extended release, but Rx was not sent as XR. Please verify.

## 2022-08-10 NOTE — Telephone Encounter (Signed)
Wife called back again  and said that the need the script sent to today. They are leaving tomorrow morning to go out of town

## 2022-08-12 ENCOUNTER — Other Ambulatory Visit: Payer: Medicare Other

## 2022-08-13 DIAGNOSIS — M064 Inflammatory polyarthropathy: Secondary | ICD-10-CM | POA: Diagnosis not present

## 2022-08-13 DIAGNOSIS — M8588 Other specified disorders of bone density and structure, other site: Secondary | ICD-10-CM | POA: Diagnosis not present

## 2022-08-13 DIAGNOSIS — M353 Polymyalgia rheumatica: Secondary | ICD-10-CM | POA: Diagnosis not present

## 2022-08-13 DIAGNOSIS — M25531 Pain in right wrist: Secondary | ICD-10-CM | POA: Diagnosis not present

## 2022-08-16 ENCOUNTER — Ambulatory Visit: Payer: Medicare Other | Admitting: Neurology

## 2022-08-18 DIAGNOSIS — M25531 Pain in right wrist: Secondary | ICD-10-CM | POA: Diagnosis not present

## 2022-08-18 DIAGNOSIS — F322 Major depressive disorder, single episode, severe without psychotic features: Secondary | ICD-10-CM | POA: Diagnosis not present

## 2022-08-18 DIAGNOSIS — M19031 Primary osteoarthritis, right wrist: Secondary | ICD-10-CM | POA: Diagnosis not present

## 2022-08-19 ENCOUNTER — Ambulatory Visit
Admission: RE | Admit: 2022-08-19 | Discharge: 2022-08-19 | Disposition: A | Discharge status: Home / Self Care | Payer: Medicare Other | Source: Ambulatory Visit | Attending: Neurology | Admitting: Neurology

## 2022-08-19 DIAGNOSIS — H53133 Sudden visual loss, bilateral: Secondary | ICD-10-CM

## 2022-08-19 DIAGNOSIS — R29818 Other symptoms and signs involving the nervous system: Secondary | ICD-10-CM | POA: Diagnosis not present

## 2022-08-20 ENCOUNTER — Ambulatory Visit: Payer: Medicare Other | Admitting: Neurology

## 2022-08-26 ENCOUNTER — Ambulatory Visit (HOSPITAL_BASED_OUTPATIENT_CLINIC_OR_DEPARTMENT_OTHER): Payer: Medicare Other | Attending: Internal Medicine | Admitting: Internal Medicine

## 2022-08-26 VITALS — Ht 67.0 in | Wt 188.0 lb

## 2022-08-26 DIAGNOSIS — G4733 Obstructive sleep apnea (adult) (pediatric): Secondary | ICD-10-CM | POA: Diagnosis not present

## 2022-08-26 DIAGNOSIS — G4752 REM sleep behavior disorder: Secondary | ICD-10-CM | POA: Insufficient documentation

## 2022-08-29 ENCOUNTER — Other Ambulatory Visit: Payer: Self-pay | Admitting: Behavioral Health

## 2022-08-29 DIAGNOSIS — F5105 Insomnia due to other mental disorder: Secondary | ICD-10-CM

## 2022-09-01 ENCOUNTER — Other Ambulatory Visit: Payer: Self-pay | Admitting: Behavioral Health

## 2022-09-01 DIAGNOSIS — F411 Generalized anxiety disorder: Secondary | ICD-10-CM

## 2022-09-01 DIAGNOSIS — G4752 REM sleep behavior disorder: Secondary | ICD-10-CM

## 2022-09-01 DIAGNOSIS — F5105 Insomnia due to other mental disorder: Secondary | ICD-10-CM

## 2022-09-01 NOTE — Telephone Encounter (Signed)
Has an appt with Aaron Edelman tomorrow.

## 2022-09-01 NOTE — Telephone Encounter (Signed)
Has appt with Aaron Edelman tomorrow.

## 2022-09-02 ENCOUNTER — Ambulatory Visit (INDEPENDENT_AMBULATORY_CARE_PROVIDER_SITE_OTHER): Payer: Medicare Other | Admitting: Behavioral Health

## 2022-09-02 ENCOUNTER — Encounter: Payer: Self-pay | Admitting: Behavioral Health

## 2022-09-02 DIAGNOSIS — G4752 REM sleep behavior disorder: Secondary | ICD-10-CM

## 2022-09-02 DIAGNOSIS — F5105 Insomnia due to other mental disorder: Secondary | ICD-10-CM

## 2022-09-02 DIAGNOSIS — F411 Generalized anxiety disorder: Secondary | ICD-10-CM

## 2022-09-02 DIAGNOSIS — F99 Mental disorder, not otherwise specified: Secondary | ICD-10-CM | POA: Diagnosis not present

## 2022-09-02 MED ORDER — QUETIAPINE FUMARATE ER 50 MG PO TB24: 50.0000 mg | ORAL_TABLET | Freq: Every day | ORAL | 2 refills | Status: DC

## 2022-09-02 MED ORDER — ESCITALOPRAM OXALATE 20 MG PO TABS: 20.0000 mg | ORAL_TABLET | Freq: Every day | ORAL | 1 refills | Status: DC

## 2022-09-02 NOTE — Progress Notes (Signed)
[facility]  Patient ID: Anthony [name],  MRN: [id number]  PCP: Lujean Amel, MD  Date of Evaluation: 09/02/2022 Time spent:30 minutes  Chief Complaint:  Chief Complaint   Anxiety; Follow-up; Patient Education; Medication Refill; Sleep Problems     HISTORY/CURRENT STATUS: HPI  "Anthony [name]" 72 year old male presents to this office for follow up and medication management.  His wife Anthony [name] is with him during visit with his verbal consent.  Says that his nightmares and vivid dreams have improved since starting the Prazosin. He feels more calm overall and is getting better quality sleep. Wife says that his extra body movements such as "violent jerking" is still present. Says that he is waiting on review of sleep study.  He and his wife have cut back on alcohol consumption. Says his depression level today is 2/10 and anxiety is 3/10. Says that he is sleeping 9-10 hours per night.  Patient says that he feels safe and has strong family support from his wife.  Denies any hx of mania, no psychosis, no current SI or HI.     Past psychiatric medications:   Belsomra-worsening of sleep behaviors Trazodone-nocturnal erections lasting more than 2 hours.    Individual Medical History/ Review of Systems: Changes? :No   Allergies: Levaquin [levofloxacin], Suvorexant, Trazodone, Augmentin [amoxicillin-pot clavulanate], Nsaids, and Tolmetin  Current Medications:  Current Outpatient Medications:    ALPRAZolam (XANAX) 1 MG tablet, Take 1 mg by mouth at bedtime as needed for anxiety. Take a 0.5 as needed, Disp: , Rfl:    amLODipine (NORVASC) 5 MG tablet, TAKE 1 TABLET BY MOUTH EVERY DAY, Disp: 90 tablet, Rfl: 2   aspirin EC 81 MG tablet, Take 81 mg by mouth daily., Disp: , Rfl:    cetirizine (ZYRTEC) 10 MG tablet, Take 10 mg by mouth daily., Disp: , Rfl:    Clobetasol Propionate (TEMOVATE) 0.05 % external spray, Apply topically 2 (two) times daily., Disp: , Rfl:    clonazePAM  (KLONOPIN) 0.5 MG tablet, 1-3 tabs for sleep (Patient not taking: Reported on 07/26/2022), Disp: 50 tablet, Rfl: 3   EPINEPHrine 0.3 mg/0.3 mL IJ SOAJ injection, , Disp: , Rfl:    escitalopram (LEXAPRO) 20 MG tablet, Take 1 tablet (20 mg total) by mouth daily., Disp: 90 tablet, Rfl: 1   ezetimibe (ZETIA) 10 MG tablet, Take 1 tablet (10 mg total) by mouth daily., Disp: 90 tablet, Rfl: 3   HYDROcodone-acetaminophen (NORCO) 5-325 MG tablet, 1-2 tabs po q6 hours prn pain (Patient not taking: Reported on 07/26/2022), Disp: 20 tablet, Rfl: 0   latanoprost (XALATAN) 0.005 % ophthalmic solution, Place 1 drop into both eyes at bedtime. 1 drop OU HS, Disp: , Rfl:    meclizine (ANTIVERT) 25 MG tablet, Take 1 tablet (25 mg total) by mouth 3 (three) times daily as needed for up to 21 doses for dizziness. (Patient not taking: Reported on 07/26/2022), Disp: 21 tablet, Rfl: 0   Multiple Vitamins-Minerals (PRESERVISION AREDS PO), Take 1 capsule by mouth daily., Disp: , Rfl:    omeprazole (PRILOSEC) 40 MG capsule, Take 40 mg by mouth daily., Disp: , Rfl:    prazosin (MINIPRESS) 2 MG capsule, Take one 2 mg tablet by mouth daily at bedtime. If no improvement may increase by 2 mg every 5 days until reaching max dose of 6 mg total., Disp: 60 capsule, Rfl: 1   predniSONE (DELTASONE) 5 MG tablet, 5 mg daily. Takes 5 mg total; 1/2 in AM & 1/2 in PM, Disp: , Rfl:  QUEtiapine (SEROQUEL XR) 50 MG TB24 24 hr tablet, Take 1 tablet (50 mg total) by mouth at bedtime., Disp: 30 tablet, Rfl: 2   rosuvastatin (CRESTOR) 20 MG tablet, Take 1 tablet (20 mg total) by mouth daily., Disp: 90 tablet, Rfl: 3   tacrolimus (PROTOPIC) 0.1 % ointment, Apply topically as needed., Disp: , Rfl:    telmisartan (MICARDIS) 20 MG tablet, TAKE 1 TABLET BY MOUTH EVERY DAY, Disp: 90 tablet, Rfl: 3   valACYclovir (VALTREX) 500 MG tablet, as needed., Disp: , Rfl:  Medication Side Effects: none  Family Medical/ Social History: Changes? No  MENTAL HEALTH  EXAM:  There were no vitals taken for this visit.There is no height or weight on file to calculate BMI.  General Appearance: Casual, Neat, and Well Groomed  Eye Contact:  Good  Speech:  Clear and Coherent  Volume:  Normal  Mood:  Angry  Affect:  Appropriate  Thought Process:  Coherent  Orientation:  Full (Time, Place, and Person)  Thought Content: Logical   Suicidal Thoughts:  No  Homicidal Thoughts:  No  Memory:  WNL  Judgement:  Good  Insight:  Good  Psychomotor Activity:  Normal  Concentration:  Concentration: Good  Recall:  Good  Fund of Knowledge: Good  Language: Good  Assets:  Desire for Improvement  ADL's:  Intact  Cognition: WNL  Prognosis:  Good    DIAGNOSES:    ICD-10-CM   1. Generalized anxiety disorder  F41.1 QUEtiapine (SEROQUEL XR) 50 MG TB24 24 hr tablet    escitalopram (LEXAPRO) 20 MG tablet    2. 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 Insomnia due to other mental disorder  F51.05 QUEtiapine (SEROQUEL XR) 50 MG TB24 24 hr tablet   F99       Receiving Psychotherapy: No    RECOMMENDATIONS:    Greater than 50% of face to face time with patient was spent on counseling and coordination of care. We talked about his recent sleep study but he has not yet reviewed results. We discussed his report of abnormal movement affecting his entire body during sleep. Wife states that movement are sometimes violent knocking over lamps. Encouraged him to have conversation with PCP and continued follow up with sleep specialist. Reporting anxiety has improved and some improvement with sleep quality. Reports nightmares and disturbing vivid dreams have decreased with use of Prazosin.  We agreed today: To continue Xanax 0.5 mg twice daily as needed for anxiety To continue Lexapro 20 mg daily To increase Seroquel to 50 mg daily Continue Prazosin 6 mg at bedtime.  To follow up in 12 weeks to reassess To report worsening  symptoms promptly Provided emergency contact information Discussed potential metabolic side effects associated with atypical antipsychotics, as well as potential risk for movement side effects. Advised pt to contact office if movement side effects occur.   Discussed potential benefits, risk, and side effects of benzodiazepines to include potential risk of tolerance and dependence, as well as possible drowsiness.  Advised patient not to drive if experiencing drowsiness and to take lowest possible effective dose to minimize risk of dependence and tolerance.  Reviewed PDMP           Elwanda Brooklyn, NP

## 2022-09-04 DIAGNOSIS — G4733 Obstructive sleep apnea (adult) (pediatric): Secondary | ICD-10-CM

## 2022-09-04 NOTE — Procedures (Signed)
Patient Name: Anthony [name], Anthony [name] Date: 08/26/2022 Gender: Male D.O.B: June 25, 1951 Age (years): 43 Referring Provider: Baird Lyons MD, ABSM Height (inches): 67 Interpreting Physician: Baird Lyons MD, ABSM Weight (lbs): 188 RPSGT: Baxter Flattery BMI: 29 MRN: [id number] Neck Size: 15.50  CLINICAL INFORMATION The patient is referred for a CPAP titration to treat sleep apnea.  Date of NPSG, Split Night or HST: NPSG 10/01/2012  AHI 16.2/ hr, desaturation to 88%, body weight 172 lbs  SLEEP STUDY TECHNIQUE As per the AASM Manual for the Scoring of Sleep and Associated Events v2.3 (April 2016) with a hypopnea requiring 4% desaturations.  The channels recorded and monitored were frontal, central and occipital EEG, electrooculogram (EOG), submentalis EMG (chin), nasal and oral airflow, thoracic and abdominal wall motion, anterior tibialis EMG, snore microphone, electrocardiogram, and pulse oximetry. Continuous positive airway pressure (CPAP) was initiated at the beginning of the study and titrated to treat sleep-disordered breathing.  MEDICATIONS Medications self-administered by patient taken the night of the study :  none reported  TECHNICIAN COMMENTS Comments added by technician: Patient had difficulty initiating sleep. Comments added by scorer: N/A  RESPIRATORY PARAMETERS Optimal PAP Pressure (cm): 10 AHI at Optimal Pressure (/hr): 0 Overall Minimal O2 (%): 87.0 Supine % at Optimal Pressure (%): 100 Minimal O2 at Optimal Pressure (%): 89.0   SLEEP ARCHITECTURE The study was initiated at 11:03:07 PM and ended at 4:58:04 AM.  Sleep onset time was 50.4 minutes and the sleep efficiency was 62.4%. The total sleep time was 221.6 minutes.  The patient spent 5.6% of the night in stage N1 sleep, 89.4% in stage N2 sleep, 0.0% in stage N3 and 5% in REM. Stage REM latency was 255.0 minutes  Wake after sleep onset was 83.0. Alpha intrusion was absent. Supine sleep was  100.00%.  CARDIAC DATA The 2 lead EKG demonstrated sinus rhythm. The mean heart rate was 56.5 beats per minute. Other EKG findings include: None.  LEG MOVEMENT DATA The total Periodic Limb Movements of Sleep (PLMS) were 0. The PLMS index was 0.0. A PLMS index of <15 is considered normal in adults.  IMPRESSIONS - The optimal PAP pressure was 10 cm of water. - Mild oxygen desaturations were observed during this titration (min O2 = 87.0%).Mean O2 saturation with CPAP 10 was 92.6%. - No snoring was audible during this study. - No cardiac abnormalities were observed during this study. - Clinically significant periodic limb movements were not noted during this study.  - Sleep talking during REM without significant body movement noted. REM time was limited.  DIAGNOSIS - Obstructive Sleep Apnea (G47.33)  RECOMMENDATIONS - Trial of CPAP therapy on 10 cm H2O or autopap 8-15. - Patient used a Medium size Resmed Nasal Pillow AirFit P10 mask and heated humidification. - Speech during REM was noted, consistent with patient history suggesting REM BehaviorDisorder, although movement during REM was not demonstrated durring short REM time on this study.  - Sleep hygiene should be reviewed to assess factors that may improve sleep quality. - Weight management and regular exercise should be initiated or continued.  [Electronically signed] 09/04/2022 11:08 AM  Baird Lyons MD, ABSM Diplomate, American Board of Sleep Medicine NPI: [id number]                          [place], [place] [facility]  ELECTRONICALLY SIGNED ON:  09/04/2022, 10:54 AM [place]: [phone]   FX: [id number] ACCREDITED  BY THE AMERICAN ACADEMY OF SLEEP MEDICINE

## 2022-09-10 ENCOUNTER — Other Ambulatory Visit: Payer: Self-pay | Admitting: Cardiology

## 2022-09-10 ENCOUNTER — Other Ambulatory Visit: Payer: Self-pay | Admitting: Behavioral Health

## 2022-09-10 DIAGNOSIS — F5105 Insomnia due to other mental disorder: Secondary | ICD-10-CM

## 2022-09-14 DIAGNOSIS — L821 Other seborrheic keratosis: Secondary | ICD-10-CM | POA: Diagnosis not present

## 2022-09-14 DIAGNOSIS — L603 Nail dystrophy: Secondary | ICD-10-CM | POA: Diagnosis not present

## 2022-09-14 DIAGNOSIS — L578 Other skin changes due to chronic exposure to nonionizing radiation: Secondary | ICD-10-CM | POA: Diagnosis not present

## 2022-09-14 DIAGNOSIS — Z808 Family history of malignant neoplasm of other organs or systems: Secondary | ICD-10-CM | POA: Diagnosis not present

## 2022-09-14 DIAGNOSIS — D225 Melanocytic nevi of trunk: Secondary | ICD-10-CM | POA: Diagnosis not present

## 2022-09-14 DIAGNOSIS — L814 Other melanin hyperpigmentation: Secondary | ICD-10-CM | POA: Diagnosis not present

## 2022-09-15 DIAGNOSIS — M25531 Pain in right wrist: Secondary | ICD-10-CM | POA: Diagnosis not present

## 2022-09-15 DIAGNOSIS — M19031 Primary osteoarthritis, right wrist: Secondary | ICD-10-CM | POA: Diagnosis not present

## 2022-09-21 ENCOUNTER — Encounter: Payer: Self-pay | Admitting: Cardiology

## 2022-09-21 ENCOUNTER — Ambulatory Visit: Payer: Medicare Other | Attending: Cardiology | Admitting: Cardiology

## 2022-09-21 VITALS — BP 114/72 | HR 53 | Ht 67.0 in | Wt 195.2 lb

## 2022-09-21 DIAGNOSIS — G4733 Obstructive sleep apnea (adult) (pediatric): Secondary | ICD-10-CM | POA: Diagnosis not present

## 2022-09-21 DIAGNOSIS — R001 Bradycardia, unspecified: Secondary | ICD-10-CM | POA: Diagnosis not present

## 2022-09-21 DIAGNOSIS — I951 Orthostatic hypotension: Secondary | ICD-10-CM | POA: Diagnosis not present

## 2022-09-21 DIAGNOSIS — G4752 REM sleep behavior disorder: Secondary | ICD-10-CM | POA: Insufficient documentation

## 2022-09-21 NOTE — Progress Notes (Signed)
Cardiology Office Note:    Date:  09/21/2022   ID:  Anthony [name], DOB September 03, 1950, MRN [id number]  PCP:  Lujean Amel, MD   [facility] Providers Cardiologist:  Candee Furbish, MD     Referring MD: Lujean Amel, MD    History of Present Illness:    Anthony [name] is a 72 y.o. male here for follow up orthostatic type symptoms, nightmares, prior PE, bradycardia, OSA, moderate nonobstructive coronary disease on coronary CT scan.  ZIO 2020 showed Chronotropic competence  Average heart rate was 63  Previously was having occasional dizzy spells when squatting to standing.  These have worsened.    Overall spent 6 months with Eliquis, negative DVT negative hypercoagulable work-up no further recurrence.  Pulmonary artery was mildly dilated.  Wife here today, nurse. Heart rate always slow.  She is also a patient of mine, Geophysical data processor.  Has enjoyed the outdoors with his wife.  Rafting trips, kayak.  [place] trail on a prior Valentine's Day.  Unfortunately, his anxiety, nightmares have gotten worse.  Seroquel and Minipress has been started.  More episodes of near syncope when standing up.  See below for details.  Past Medical History:  Diagnosis Date   Asbestos exposure 09/14/2016   Worked as a Chief Financial Officer many years ago, disturbing asbestos insulation and also smoking at that time   Coronary artery disease involving native coronary artery of native heart without angina pectoris 09/22/2021   CTA of chest 02/2021 - coronary artery calcifications   Depression    GERD (gastroesophageal reflux disease)    Glaucoma    Headache(784.0)    Hypertension    Obstructive sleep apnea 12/20/2012   NPSG 10/01/12- AHI 16.2/ hr, weight 172 lbs, mild limb jerks CPAP auto> 9 cwp APS    Psoriasis    Pulmonary embolism ([place]) 08/22/2017   Multiple pulmonary emboli identified on CTa chest 08/15/17 at [facility]. Report in Media.  Eliquis begun 08/2017   Seasonal allergies     Sinus bradycardia 09/22/2021   ZIO 2020 normal, Avg HR 60's. Chronotropic competence   Sleep apnea    using a cpap-moderate    Past Surgical History:  Procedure Laterality Date   APPENDECTOMY     CARPAL TUNNEL WITH CUBITAL TUNNEL  2008   right   CARPAL TUNNEL WITH CUBITAL TUNNEL  2009   and ulnar nerve-left   ESOPHAGEAL MANOMETRY N/A 10/25/2018   Procedure: ESOPHAGEAL MANOMETRY (EM);  Surgeon: Wilford Corner, MD;  Location: [facility];  Service: Endoscopy;  Laterality: N/A;   EXTERNAL EAR SURGERY     ears reset surgically age 26   NAILBED REPAIR Right 05/08/2021   Procedure: RIGHT LONG FINGER NAIL UNIT BIOPSY;  Surgeon: Leanora Cover, MD;  Location: [place];  Service: Orthopedics;  Laterality: Right;   [place] IMPEDANCE STUDY N/A 10/25/2018   Procedure: [place] IMPEDANCE STUDY;  Surgeon: Wilford Corner, MD;  Location: [facility];  Service: Endoscopy;  Laterality: N/A;   RETINAL DETACHMENT SURGERY  2004   right   RETINAL DETACHMENT SURGERY  2002   left   SHOULDER ARTHROSCOPY  2006   left   TONSILLECTOMY     TRIGGER FINGER RELEASE Left 02/15/2013   Procedure: RELEASE TRIGGER FINGER/A-1 PULLEY LEFT LONG FINGER;  Surgeon: Cammie Sickle., MD;  Location: [place];  Service: Orthopedics;  Laterality: Left;   TRIGGER FINGER RELEASE Left 09/06/2013   Procedure: RELEASE TRIGGER FINGER/A-1 PULLEY;  Surgeon: Cammie Sickle., MD;  Location: [place];  Service: Orthopedics;  Laterality: Left;   TRIGGER FINGER RELEASE Right 08/05/2014   Procedure: RIGHT SMALL TRIGGER RELEASE ;  Surgeon: Leanora Cover, MD;  Location: [place];  Service: Orthopedics;  Laterality: Right;   ULNAR NERVE TRANSPOSITION Left 09/06/2013   Procedure: LEFT ANTERIOR SUBCUTANEOUS TRANSPOSITION;  Surgeon: Cammie Sickle., MD;  Location: [place];  Service: Orthopedics;  Laterality: Left;    Current Medications: Current Meds  Medication  Sig   ALPRAZolam (XANAX) 1 MG tablet Take 1 mg by mouth at bedtime as needed for anxiety. Take a 0.5 as needed   aspirin EC 81 MG tablet Take 81 mg by mouth daily.   cetirizine (ZYRTEC) 10 MG tablet Take 10 mg by mouth daily.   Clobetasol Propionate (TEMOVATE) 0.05 % external spray Apply topically 2 (two) times daily.   EPINEPHrine 0.3 mg/0.3 mL IJ SOAJ injection    escitalopram (LEXAPRO) 20 MG tablet Take 1 tablet (20 mg total) by mouth daily.   ezetimibe (ZETIA) 10 MG tablet Take 1 tablet (10 mg total) by mouth daily.   latanoprost (XALATAN) 0.005 % ophthalmic solution Place 1 drop into both eyes at bedtime. 1 drop OU HS   Multiple Vitamins-Minerals (PRESERVISION AREDS PO) Take 1 capsule by mouth daily.   omeprazole (PRILOSEC) 40 MG capsule Take 40 mg by mouth daily.   prazosin (MINIPRESS) 2 MG capsule Take 6 mg by mouth at bedtime.   predniSONE (DELTASONE) 5 MG tablet 5 mg daily. Takes 5 mg total; 1/2 in AM & 1/2 in PM   QUEtiapine (SEROQUEL XR) 50 MG TB24 24 hr tablet Take 1 tablet (50 mg total) by mouth at bedtime.   rosuvastatin (CRESTOR) 20 MG tablet TAKE 1 TABLET BY MOUTH EVERY DAY   tacrolimus (PROTOPIC) 0.1 % ointment Apply topically as needed.   valACYclovir (VALTREX) 500 MG tablet as needed.   [DISCONTINUED] amLODipine (NORVASC) 5 MG tablet TAKE 1 TABLET BY MOUTH EVERY DAY   [DISCONTINUED] telmisartan (MICARDIS) 20 MG tablet TAKE 1 TABLET BY MOUTH EVERY DAY     Allergies:   Levaquin [levofloxacin], Suvorexant, Trazodone, Augmentin [amoxicillin-pot clavulanate], Nsaids, and Tolmetin   Social History   Socioeconomic History   Marital status: Married    Spouse name: Thayer Headings   Number of children: 0   Years of education: 12   Highest education level: Some college, no degree  Occupational History   Not on file  Tobacco Use   Smoking status: Former    Packs/day: 1.00    Years: 5.00    Total pack years: 5.00    Types: Cigarettes    Quit date: 08/09/1970    Years since  quitting: 52.1   Smokeless tobacco: Never  Vaping Use   Vaping Use: Never used  Substance and Sexual Activity   Alcohol use: Yes    Alcohol/week: 7.0 standard drinks of alcohol    Types: 7 Cans of beer per week    Comment: social   Drug use: Never   Sexual activity: Not on file  Other Topics Concern   Not on file  Social History Narrative   Are you right handed or left handed? Right handed    Are you currently employed ? no   What is your current occupation? Retired    Do you live at home alone? no   Who lives with you? Wife    What type of home do you live in: 1 story or  2 story? 2 story stays on one level        Social Determinants of Health   Financial Resource Strain: Not on file  Food Insecurity: Not on file  Transportation Needs: Not on file  Physical Activity: Not on file  Stress: Not on file  Social Connections: Not on file     Family History: The patient's family history includes Polymyalgia rheumatica in his sister.  ROS:   Please see the history of present illness.     All other systems reviewed and are negative.  EKGs/Labs/Other Studies Reviewed:    The following studies were reviewed today: Echocardiogram -EF [id number] grade 2 diastolic dysfunction  Coronary CT scan 09/28/2021: Nonobstructive coronary artery disease seen on CT scan.  Continue with Crestor.  Awaiting LDL soon.  This labs should be ordered. Dilated pulmonary artery noted.  This may be from prior PEs.  Awaiting echocardiogram to evaluate pulmonary pressures.  This was ordered at last clinic visit.  EKG:  EKG is  ordered today.  The ekg ordered today demonstrates sinus bradycardia 49 no other abnormalities, prior sinus bradycardia 57 no other abnormalities, prior EKG sinus rhythm 60 with no other abnormalities Recent Labs: 12/11/2021: ALT 38 05/18/2022: BUN 19; Creatinine, Ser 0.97; Hemoglobin 13.9; Platelets 200; Potassium 4.1; Sodium 137  Recent Lipid Panel    Component Value Date/Time   CHOL  101 03/24/2022 0801   TRIG 143 03/24/2022 0801   HDL 37 (L) 03/24/2022 0801   CHOLHDL 2.7 03/24/2022 0801   LDLCALC 39 03/24/2022 0801             Physical Exam:    VS:  BP 114/72   Pulse (!) 53   Ht 5' 7"$  (1.702 m)   Wt 195 lb 3.2 oz (88.5 kg)   SpO2 97%   BMI 30.57 kg/m     Wt Readings from Last 3 Encounters:  09/21/22 195 lb 3.2 oz (88.5 kg)  08/26/22 188 lb (85.3 kg)  07/26/22 192 lb 6.4 oz (87.3 kg)     GEN:  Well nourished, well developed in no acute distress HEENT: Normal NECK: No JVD; No carotid bruits LYMPHATICS: No lymphadenopathy CARDIAC: Brady reg, no murmurs, no rubs, gallops RESPIRATORY:  Clear to auscultation without rales, wheezing or rhonchi  ABDOMEN: Soft, non-tender, non-distended MUSCULOSKELETAL:  No edema; No deformity  SKIN: Warm and dry NEUROLOGIC:  Alert and oriented x 3 PSYCHIATRIC:  Normal affect   ASSESSMENT:    1. Orthostatic hypotension   2. Sinus bradycardia   3. Obstructive sleep apnea   4. REM behavioral disorder     PLAN:    In order of problems listed above:   Sinus bradycardia Avoid AV nodal blockers Doing well Prior history ranging from 50s to 90s usual for him.  Sounds more orthostatic when he explains his dizziness.  We have made changes to his medication, stopping amlodipine and telmisartan.  Continue to hydrate well.  Monitor has been reassuring.   Obstructive sleep apnea Dr. Annamaria Boots following. Note reviewed.  Continuing to monitor.  History of pulmonary embolus (PE) Stable no recurrence.  Multiple pulmonary emboli identified on CTa chest 08/15/17 at [facility]. Report in Media.  Eliquis for 6 months. Hypercoag workup normal.  Has mildly dilated pulmonary artery noted on CT scan.  Likely from prior PEs.  No evidence of elevated pulmonary pressures on echocardiogram.  Primary hypertension with symptoms of orthostatic hypotension We will go ahead and stop his amlodipine 5 mg and telmisartan 20 mg.  Blood pressure  has been getting too low.  He is having symptoms of graying out when he is standing.  He is being ultra careful.  In addition, he has recently been placed on Prazosin increased up to 6 mg for his nightmares.  This has helped out his dreams.  He is also on Seroquel started in December 2023.  Feels better.  Almost to sedate.  Nightmares have been quite fevers from 4 to 7 AM.  He can jump out of the bed and 1 time knocked over the marble nightstand.  Had to have Dermabond in the emergency room.  Clonidine was stopped Had ER visit after hypotension. Went to [place]. Stopped Clonidine.   Pure hypercholesterolemia Crestor to 10 mg once a day.    Coronary artery disease involving native coronary artery of native heart without angina pectoris CT of coronary arteries 09/2021- coronary artery calcifications, nonobstructive coronary disease moderate. On Crestor 20.  Zetia Asa 81 LDL 52    41 minutes spent with review of medical records, discussion with patient, wife, dictation, changes in medications.      Medication Adjustments/Labs and Tests Ordered: Current medicines are reviewed at length with the patient today.  Concerns regarding medicines are outlined above.  No orders of the defined types were placed in this encounter.  No orders of the defined types were placed in this encounter.   Patient Instructions  Medication Instructions:  Please discontinue your Telmisartan and Amlodipine.  Continue all other medications as listed.  *If you need a refill on your cardiac medications before your next appointment, please call your pharmacy*  Follow-Up: At [facility], you and your health needs are our priority.  As part of our continuing mission to provide you with exceptional heart care, we have created designated Provider Care Teams.  These Care Teams include your primary Cardiologist (physician) and Advanced Practice Providers (APPs -  Physician Assistants and Nurse Practitioners) who all  work together to provide you with the care you need, when you need it.  We recommend signing up for the patient portal called "MyChart".  Sign up information is provided on this After Visit Summary.  MyChart is used to connect with patients for Virtual Visits (Telemedicine).  Patients are able to view lab/test results, encounter notes, upcoming appointments, etc.  Non-urgent messages can be sent to your provider as well.   To learn more about what you can do with MyChart, go to [email or web address].    Your next appointment:   3 month(s)  Provider:   Nicholes Rough, PA-C, Ambrose Pancoast, NP, Ermalinda Barrios, PA-C, Christen Bame, NP, or Richardson Dopp, PA-C            Signed, Candee Furbish, MD  09/21/2022 12:31 PM    [place]

## 2022-09-21 NOTE — Patient Instructions (Addendum)
Medication Instructions:  Please discontinue your Telmisartan and Amlodipine.  Continue all other medications as listed.  *If you need a refill on your cardiac medications before your next appointment, please call your pharmacy*  Follow-Up: At [facility], you and your health needs are our priority.  As part of our continuing mission to provide you with exceptional heart care, we have created designated Provider Care Teams.  These Care Teams include your primary Cardiologist (physician) and Advanced Practice Providers (APPs -  Physician Assistants and Nurse Practitioners) who all work together to provide you with the care you need, when you need it.  We recommend signing up for the patient portal called "MyChart".  Sign up information is provided on this After Visit Summary.  MyChart is used to connect with patients for Virtual Visits (Telemedicine).  Patients are able to view lab/test results, encounter notes, upcoming appointments, etc.  Non-urgent messages can be sent to your provider as well.   To learn more about what you can do with MyChart, go to [email or web address].    Your next appointment:   3 month(s)  Provider:   Nicholes Rough, PA-C, Ambrose Pancoast, NP, Ermalinda Barrios, PA-C, Christen Bame, NP, or Richardson Dopp, PA-C

## 2022-09-27 ENCOUNTER — Other Ambulatory Visit: Payer: Self-pay | Admitting: Behavioral Health

## 2022-09-27 ENCOUNTER — Telehealth: Payer: Self-pay | Admitting: Behavioral Health

## 2022-09-27 DIAGNOSIS — F5105 Insomnia due to other mental disorder: Secondary | ICD-10-CM

## 2022-09-27 NOTE — Telephone Encounter (Signed)
Pt's wife called  @ 12:27p.  She is on DPR.   Pt was in the background. Prazosin is doing fine so they wanted to see if you would send in a 90 day script.  He is taking 6 mgs or (3) - 2 mg pills every night.  If there is a larger dose pill available they would like that.  Next appt 4/17

## 2022-09-27 NOTE — Telephone Encounter (Signed)
Please see message and advise 

## 2022-09-28 ENCOUNTER — Other Ambulatory Visit: Payer: Self-pay | Admitting: Behavioral Health

## 2022-09-28 DIAGNOSIS — F5105 Insomnia due to other mental disorder: Secondary | ICD-10-CM

## 2022-09-28 IMAGING — CT CT NECK W/ CM
4 of 5 series · 14 of 33 positions shown, 16 images · IV contrast (APPLIED)
Comparison: None.

CLINICAL DATA: Bilateral neck swelling for couple of years

EXAM:
CT NECK WITH CONTRAST
TECHNIQUE: Multidetector CT imaging of the neck was performed using the
standard protocol following the bolus administration of intravenous
contrast.
CONTRAST:  75mL OMNIPAQUE IOHEXOL 300 MG/ML  SOLN

[Series 2: axial neck (person_name) · axial · 0.57mm/px · z∈[-642,-520]mm · 3 of 123 slices shown]
[im 31/123  bone]
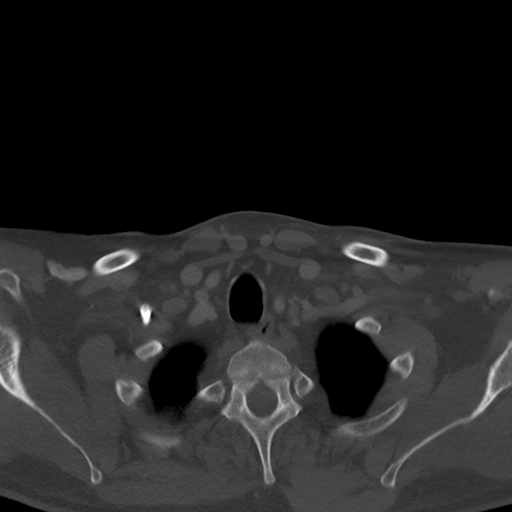
[im 62/123  bone]
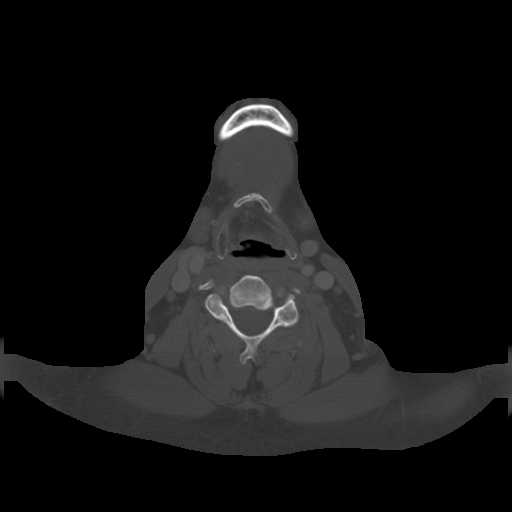
[im 92/123  bone]
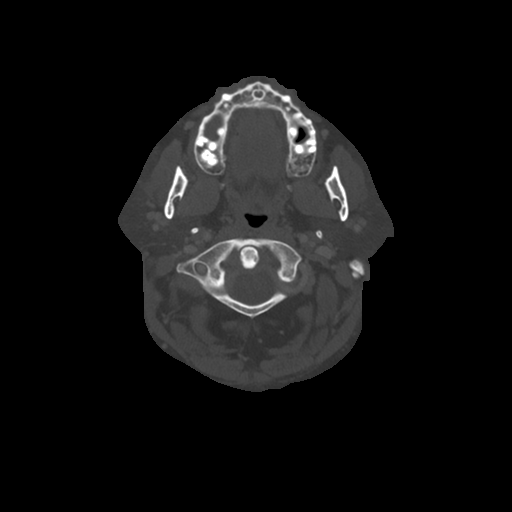

[Series 4: cor neck · coronal · 0.44mm/px · 3 of 155 slices shown]
[im 31/155  bone]
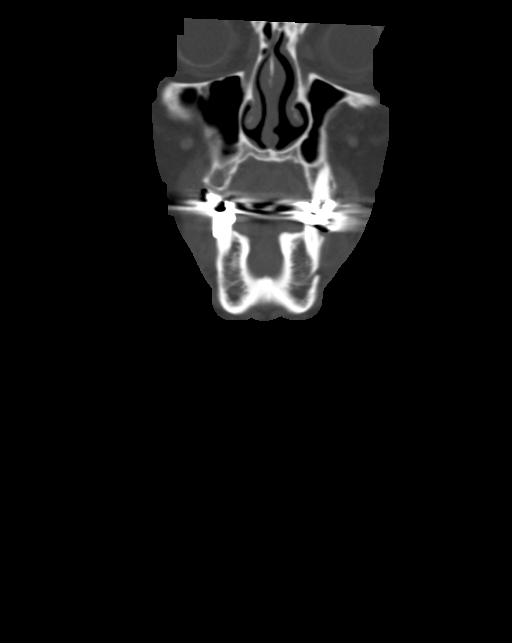
[im 62/155  bone]
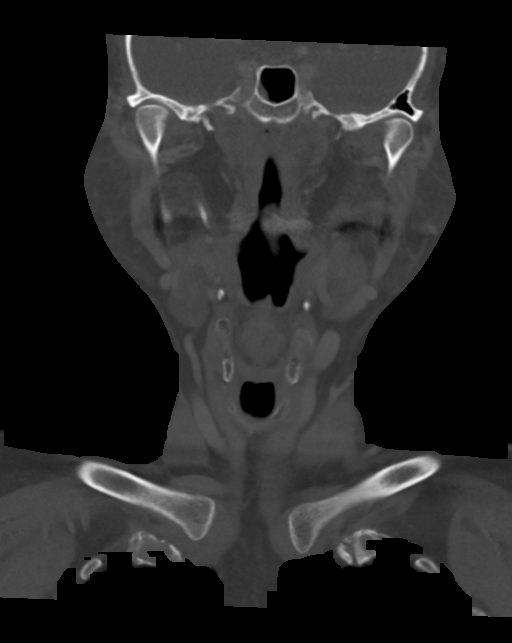
[im 93/155  bone]
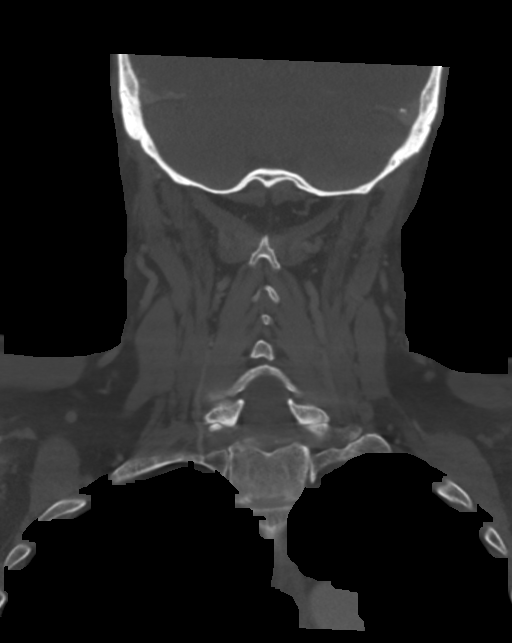

[Series 5: sag neck · sagittal · 0.55mm/px · 5 of 112 slices shown, 6 images]
[im 38/112  bone]
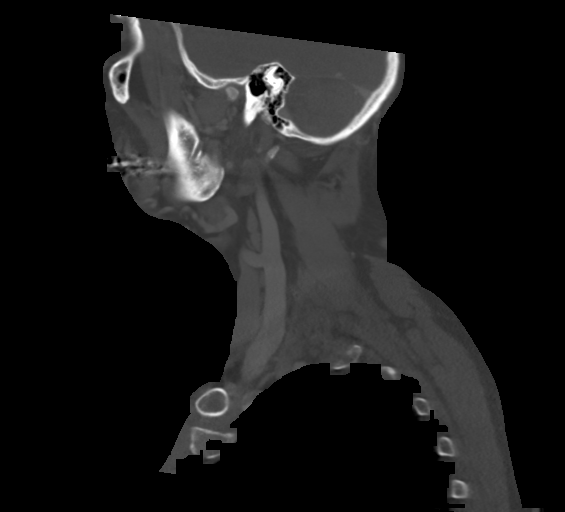
[im 47/112  bone]
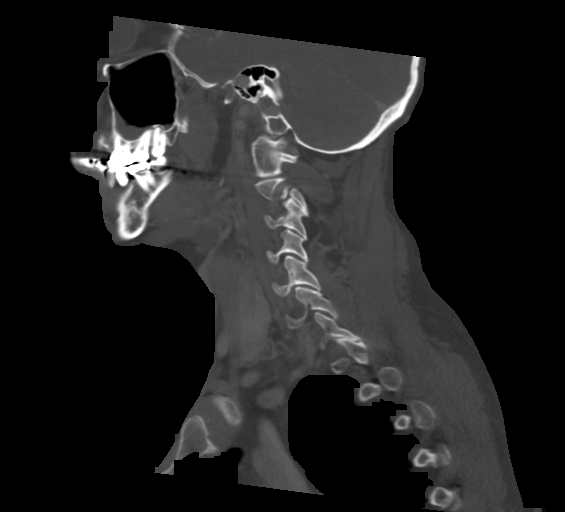
[im 56/112  soft-tissue]
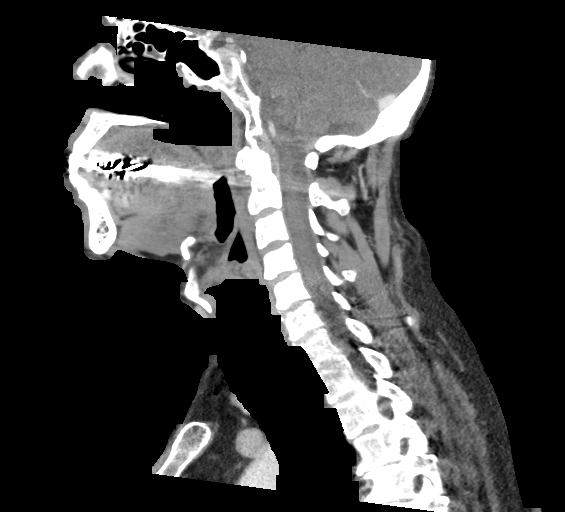
[im 56/112  bone]
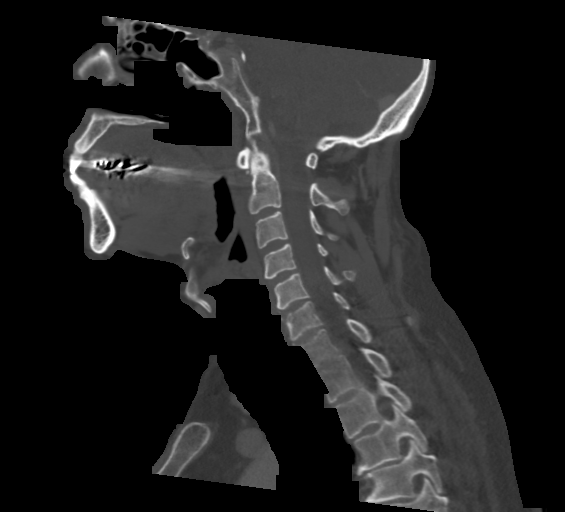
[im 65/112  bone]
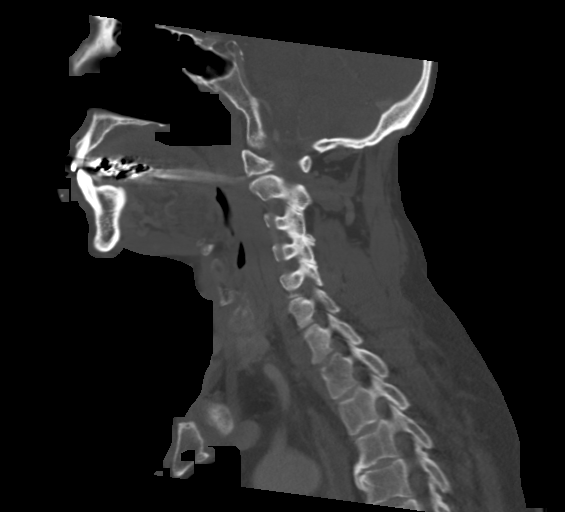
[im 75/112  bone]
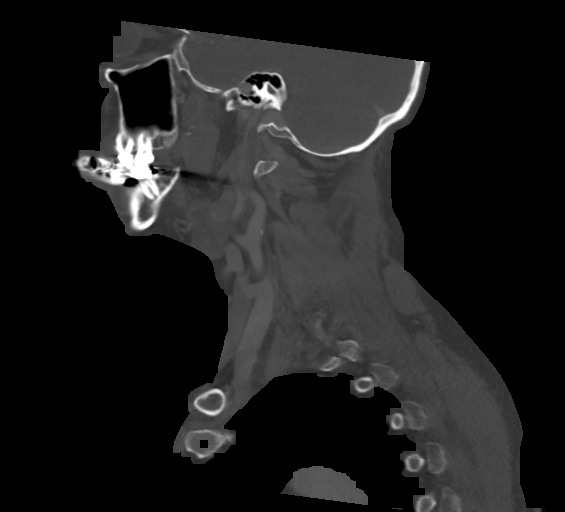

[Series 6: ax oropharynx · axial · 0.44mm/px · z∈[-669,-530]mm · 3 of 141 slices shown, 4 images]
[im 36/141  soft-tissue]
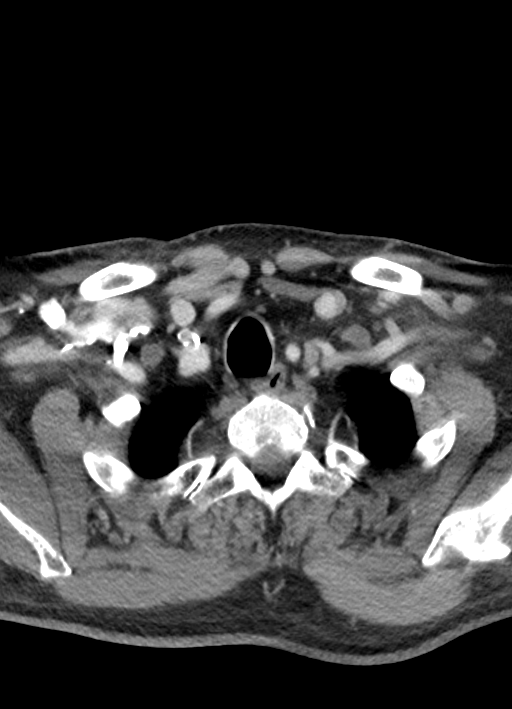
[im 36/141  bone]
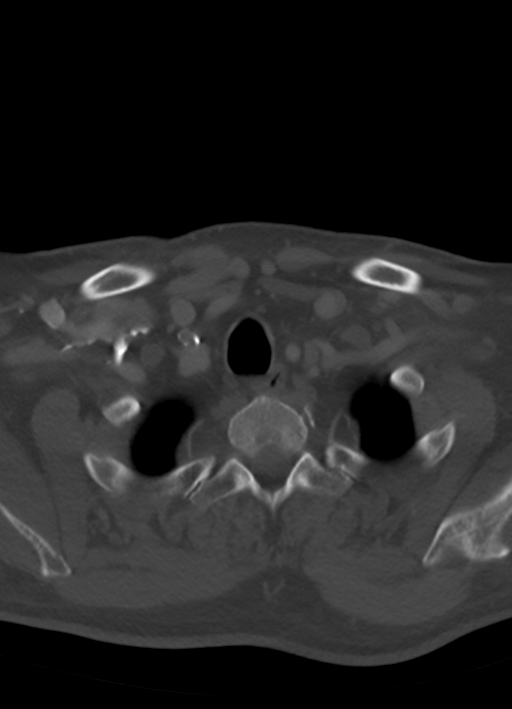
[im 71/141  bone]
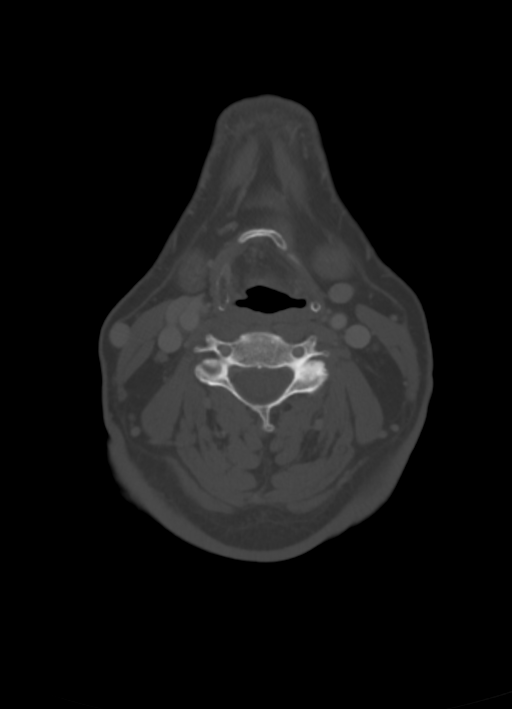
[im 106/141  bone]
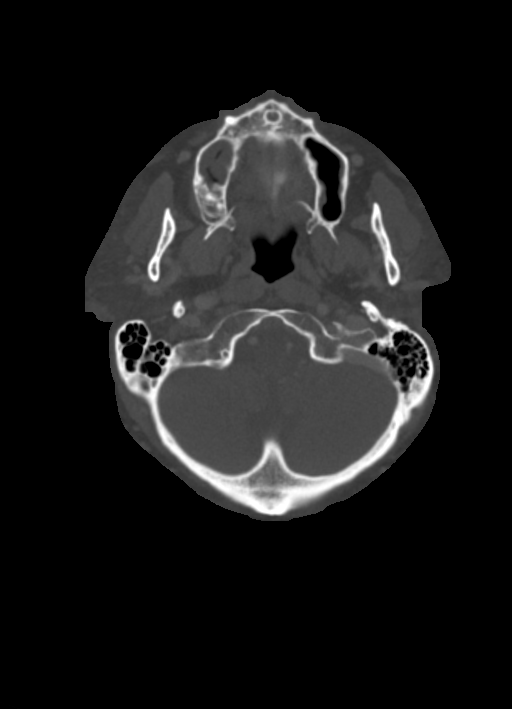

[14 of 33 positions shown; findings below may reference images not displayed]

FINDINGS: Pharynx and larynx: No mass or swelling. Flattening of the left
larynx which may be from old trauma.

Salivary glands: No inflammation, mass, or stone.

Thyroid: Normal.

Lymph nodes: None enlarged or abnormal density.

Vascular: Negative.

Limited intracranial: Negative.

Visualized orbits: Bilateral cataract resection

Mastoids and visualized paranasal sinuses: Mild mucosal thickening
in the paranasal sinuses.

Skeleton: No acute or aggressive finding

Upper chest: Negative.
IMPRESSION: Negative for mass or swelling.  No specific cause for symptoms.

## 2022-09-28 MED ORDER — PRAZOSIN HCL 2 MG PO CAPS: ORAL_CAPSULE | ORAL | 1 refills | Status: DC

## 2022-10-06 ENCOUNTER — Other Ambulatory Visit: Payer: Self-pay | Admitting: Cardiology

## 2022-10-13 DIAGNOSIS — Z961 Presence of intraocular lens: Secondary | ICD-10-CM | POA: Diagnosis not present

## 2022-10-13 DIAGNOSIS — H401331 Pigmentary glaucoma, bilateral, mild stage: Secondary | ICD-10-CM | POA: Diagnosis not present

## 2022-11-23 DIAGNOSIS — K5900 Constipation, unspecified: Secondary | ICD-10-CM | POA: Diagnosis not present

## 2022-11-23 DIAGNOSIS — K219 Gastro-esophageal reflux disease without esophagitis: Secondary | ICD-10-CM | POA: Diagnosis not present

## 2022-11-23 DIAGNOSIS — Z86010 Personal history of colonic polyps: Secondary | ICD-10-CM | POA: Diagnosis not present

## 2022-11-24 ENCOUNTER — Encounter: Payer: Self-pay | Admitting: Behavioral Health

## 2022-11-24 ENCOUNTER — Ambulatory Visit (INDEPENDENT_AMBULATORY_CARE_PROVIDER_SITE_OTHER): Payer: Medicare Other | Admitting: Behavioral Health

## 2022-11-24 ENCOUNTER — Ambulatory Visit: Payer: Medicare Other | Admitting: Behavioral Health

## 2022-11-24 DIAGNOSIS — F5105 Insomnia due to other mental disorder: Secondary | ICD-10-CM

## 2022-11-24 DIAGNOSIS — F411 Generalized anxiety disorder: Secondary | ICD-10-CM | POA: Diagnosis not present

## 2022-11-24 DIAGNOSIS — F99 Mental disorder, not otherwise specified: Secondary | ICD-10-CM | POA: Diagnosis not present

## 2022-11-24 NOTE — Progress Notes (Signed)
[facility]  Patient ID: ALVARO AUNGST,  MRN: [id number]  PCP: Darrow Bussing, MD  Date of Evaluation: 11/24/2022 Time spent:30 minutes  Chief Complaint:  Chief Complaint   Anxiety; Medication Refill; Patient Education; Follow-up     HISTORY/CURRENT STATUS: HPI "Isabel" 72 year old male presents to this office for follow up and medication management.    Says that his nightmares and vivid dreams have improved since starting the Prazosin. He feels more calm overall and is getting better quality sleep.  Overall, he is very happy so far with his medication. Says his wife is having some depression and anxiety problems causing extra stress but he is trying to be supportive. He and his wife have cut back on alcohol consumption. Says his depression level today is 2/10 and anxiety is 2/10. Says that he is sleeping 9-10 hours per night.  Patient says that he feels safe and has strong family support from his wife.  Denies any hx of mania, no psychosis, no current SI or HI.     Past psychiatric medications:   Belsomra-worsening of sleep behaviors Trazodone-nocturnal erections lasting more than 2 hours.        Individual Medical History/ Review of Systems: Changes? :No   Allergies: Levaquin [levofloxacin], Suvorexant, Trazodone, Augmentin [amoxicillin-pot clavulanate], Nsaids, and Tolmetin  Current Medications:  Current Outpatient Medications:    ALPRAZolam (XANAX) 1 MG tablet, Take 1 mg by mouth at bedtime as needed for anxiety. Take a 0.5 as needed, Disp: , Rfl:    aspirin EC 81 MG tablet, Take 81 mg by mouth daily., Disp: , Rfl:    cetirizine (ZYRTEC) 10 MG tablet, Take 10 mg by mouth daily., Disp: , Rfl:    Clobetasol Propionate (TEMOVATE) 0.05 % external spray, Apply topically 2 (two) times daily., Disp: , Rfl:    clonazePAM (KLONOPIN) 0.5 MG tablet, 1-3 tabs for sleep (Patient not taking: Reported on 07/26/2022), Disp: 50 tablet, Rfl: 3   EPINEPHrine 0.3 mg/0.3  mL IJ SOAJ injection, , Disp: , Rfl:    escitalopram (LEXAPRO) 20 MG tablet, Take 1 tablet (20 mg total) by mouth daily., Disp: 90 tablet, Rfl: 1   ezetimibe (ZETIA) 10 MG tablet, Take 1 tablet (10 mg total) by mouth daily., Disp: 90 tablet, Rfl: 3   HYDROcodone-acetaminophen (NORCO) 5-325 MG tablet, 1-2 tabs po q6 hours prn pain (Patient not taking: Reported on 07/26/2022), Disp: 20 tablet, Rfl: 0   latanoprost (XALATAN) 0.005 % ophthalmic solution, Place 1 drop into both eyes at bedtime. 1 drop OU HS, Disp: , Rfl:    meclizine (ANTIVERT) 25 MG tablet, Take 1 tablet (25 mg total) by mouth 3 (three) times daily as needed for up to 21 doses for dizziness. (Patient not taking: Reported on 07/26/2022), Disp: 21 tablet, Rfl: 0   Multiple Vitamins-Minerals (PRESERVISION AREDS PO), Take 1 capsule by mouth daily., Disp: , Rfl:    omeprazole (PRILOSEC) 40 MG capsule, Take 40 mg by mouth daily., Disp: , Rfl:    prazosin (MINIPRESS) 2 MG capsule, Take 6 mg by mouth at bedtime., Disp: , Rfl:    prazosin (MINIPRESS) 2 MG capsule, TAKE 1 TABLET BY MOUTH DAILY AT BEDTIME. IF NO IMPROVEMENT MAY INCREASE BY 1 TAB EVERY 5 DAYS UNTIL REACHING MAX DOSE OF 3 TOTAL., Disp: 270 capsule, Rfl: 1   predniSONE (DELTASONE) 5 MG tablet, 5 mg daily. Takes 5 mg total; 1/2 in AM & 1/2 in PM, Disp: , Rfl:    QUEtiapine (SEROQUEL XR) 50 MG  TB24 24 hr tablet, Take 1 tablet (50 mg total) by mouth at bedtime., Disp: 30 tablet, Rfl: 2   rosuvastatin (CRESTOR) 20 MG tablet, TAKE 1 TABLET BY MOUTH EVERY DAY, Disp: 90 tablet, Rfl: 3   tacrolimus (PROTOPIC) 0.1 % ointment, Apply topically as needed., Disp: , Rfl:    valACYclovir (VALTREX) 500 MG tablet, as needed., Disp: , Rfl:  Medication Side Effects: none  Family Medical/ Social History: Changes? No  MENTAL HEALTH EXAM:  There were no vitals taken for this visit.There is no height or weight on file to calculate BMI.  General Appearance: Casual, Neat, and Well Groomed  Eye Contact:   Good  Speech:  Clear and Coherent  Volume:  Normal  Mood:  NA  Affect:  Appropriate  Thought Process:  Coherent  Orientation:  Full (Time, Place, and Person)  Thought Content: Logical   Suicidal Thoughts:  No  Homicidal Thoughts:  No  Memory:  WNL  Judgement:  Good  Insight:  Good  Psychomotor Activity:  Normal  Concentration:  Concentration: Good  Recall:  Good  Fund of Knowledge: Good  Language: Good  Assets:  Desire for Improvement  ADL's:  Intact  Cognition: WNL  Prognosis:  Good    DIAGNOSES:    ICD-10-CM   1. 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 Says he has greatly improved with sleep. Reports nightmares and disturbing vivid dreams have decreased with use of Prazosin. He is happy with his current meds and request no changes this visit.  We agreed today: To continue Xanax 0.5 mg twice daily as needed for anxiety To continue Lexapro 20 mg daily To continue Seroquel to 50 mg daily Continue Prazosin 6 mg at bedtime.  To follow up in 12 weeks to reassess To report worsening symptoms promptly Provided emergency contact information Discussed potential metabolic side effects associated with atypical antipsychotics, as well as potential risk for movement side effects. Advised pt to contact office if movement side effects occur.   Discussed potential benefits, risk, and side effects of benzodiazepines to include potential risk of tolerance and dependence, as well as possible drowsiness.  Advised patient not to drive if experiencing drowsiness and to take lowest possible effective dose to minimize risk of dependence and tolerance.  Reviewed PDMP       Joan Flores, NP

## 2022-11-30 ENCOUNTER — Ambulatory Visit: Payer: Medicare Other | Admitting: Behavioral Health

## 2022-12-01 ENCOUNTER — Other Ambulatory Visit: Payer: Self-pay | Admitting: Behavioral Health

## 2022-12-01 DIAGNOSIS — F5105 Insomnia due to other mental disorder: Secondary | ICD-10-CM

## 2022-12-01 DIAGNOSIS — F411 Generalized anxiety disorder: Secondary | ICD-10-CM

## 2022-12-01 DIAGNOSIS — G4752 REM sleep behavior disorder: Secondary | ICD-10-CM

## 2022-12-15 ENCOUNTER — Ambulatory Visit: Payer: Medicare Other | Admitting: Physician Assistant

## 2022-12-16 DIAGNOSIS — M6789 Other specified disorders of synovium and tendon, multiple sites: Secondary | ICD-10-CM | POA: Diagnosis not present

## 2022-12-16 DIAGNOSIS — M064 Inflammatory polyarthropathy: Secondary | ICD-10-CM | POA: Diagnosis not present

## 2022-12-16 DIAGNOSIS — M353 Polymyalgia rheumatica: Secondary | ICD-10-CM | POA: Diagnosis not present

## 2022-12-20 ENCOUNTER — Other Ambulatory Visit: Payer: Self-pay | Admitting: Cardiology

## 2022-12-20 DIAGNOSIS — I951 Orthostatic hypotension: Secondary | ICD-10-CM | POA: Insufficient documentation

## 2022-12-20 NOTE — Progress Notes (Unsigned)
Cardiology Office Note:    Date:  12/21/2022  ID:  Anthony [name], DOB [birth year], MRN [id number] PCP: Darrow Bussing, MD  [facility] Providers Cardiologist:  Donato Schultz, MD          Patient Profile:   Nonobstructive coronary artery disease  CCTA 10/05/2021: CAC score 191 (52nd percentile), dilated pulmonary artery, mild nonobstructive CAD (LAD proximal 25-49, D2 ostial 1-24, AV groove LCx distal 1-24), normal-sized aorta TTE 10/09/2021: EF 60-65, no RWMA, mild LVH, G1 DD, normal RVSF, trivial MR, RAP 3 Hypertension  Hyperlipidemia  Bradycardia Monitor 07/2019: NSR, occasional first-degree AV block, rare paroxysmal atrial tachycardia, min HR 45 during sleep, no pauses Orthostatic hypotension Hx of pulmonary embolism  OSA Anxiety, nightmares      History of Present Illness:   Anthony [name] is a 72 y.o. male who returns for follow-up of CAD, hypotension.  He was last seen by Dr. Anne Fu 09/21/2022.  He was having symptomatic hypotension.  His telmisartan and amlodipine were discontinued. He is here with his wife. He brings in a list of his BPs. They have been higher since his medications were changes. He has had systolic readings in the 150-160s at times. He has felt much better. He has not had any further dizziness with standing. His nightmares are controlled with the Prazosin. He has not had chest pain, shortness of breath, syncope, orthopnea, edema.   ROS see HPI    Studies Reviewed:    EKG:  not done  Risk Assessment/Calculations:     HYPERTENSION CONTROL Vitals:   12/21/22 0920 12/21/22 0951 12/21/22 0952  BP: (!) 144/80 (!) 150/90 (!) 148/90    The patient's blood pressure is elevated above target today.  In order to address the patient's elevated BP: A current anti-hypertensive medication was adjusted today.          Physical Exam:   VS:  BP (!) 148/90 (BP Location: Left Arm)   Pulse (!) 58   Ht 5\' 7"  (1.702 m)   Wt 197 lb 9.6 oz (89.6 kg)    SpO2 96%   BMI 30.95 kg/m    Wt Readings from Last 3 Encounters:  12/21/22 197 lb 9.6 oz (89.6 kg)  09/21/22 195 lb 3.2 oz (88.5 kg)  08/26/22 188 lb (85.3 kg)    Constitutional:      Appearance: Healthy appearance. Not in distress.  Neck:     Vascular: No carotid bruit. JVD normal.  Pulmonary:     Breath sounds: Normal breath sounds. No wheezing. No rales.  Cardiovascular:     Normal rate. Regular rhythm.     Murmurs: There is no murmur.  Edema:    Peripheral edema absent.  Abdominal:     Palpations: Abdomen is soft.       ASSESSMENT AND PLAN:   Orthostatic hypotension His symptoms have improved off of Amlodipine and Telmisartan. His BPs are now higher. We will likely have to accept higher readings to avoid recurrent symptoms. I would like to try to get him closer to <140/90. I have recommended increasing Prazosin to 8 mg daily at bedtime. I have also encouraged him to wear compression stockings to avoid orthostatic intolerance. F/u with Dr. Anne Fu in several mos. If symptoms worsen or BP increases, f/u sooner.   Coronary artery disease involving native coronary artery of native heart without angina pectoris Nonobstructive coronary artery disease on CCTA in 2023. He is not having symptoms of chest pain to suggest angina. Labs from PCP  were reviewed via [facility]. LDL in 07/2022 was optimal at 52. Continue ASA 81 mg once daily, Crestor 20 mg once daily, zetia 10 mg once daily.      Dispo:  Return in about 5 months (around 05/23/2023) for Routine Follow Up w/ Dr. Anne Fu.  Signed, Tereso Newcomer, PA-C

## 2022-12-21 ENCOUNTER — Ambulatory Visit: Payer: Medicare Other | Attending: Physician Assistant | Admitting: Physician Assistant

## 2022-12-21 ENCOUNTER — Encounter: Payer: Self-pay | Admitting: Physician Assistant

## 2022-12-21 VITALS — BP 148/90 | HR 58 | Ht 67.0 in | Wt 197.6 lb

## 2022-12-21 DIAGNOSIS — I2510 Atherosclerotic heart disease of native coronary artery without angina pectoris: Secondary | ICD-10-CM | POA: Diagnosis not present

## 2022-12-21 DIAGNOSIS — I951 Orthostatic hypotension: Secondary | ICD-10-CM | POA: Diagnosis not present

## 2022-12-21 MED ORDER — PRAZOSIN HCL 2 MG PO CAPS: 8.0000 mg | ORAL_CAPSULE | Freq: Every day | ORAL | 3 refills | Status: DC

## 2022-12-21 NOTE — Patient Instructions (Addendum)
Medication Instructions:  Your physician has recommended you make the following change in your medication:   INCREASE the Prazosin to 2 mg taking 4 tablets daily at bed time  *If you need a refill on your cardiac medications before your next appointment, please call your pharmacy*   Lab Work: None ordered  If you have labs (blood work) drawn today and your tests are completely normal, you will receive your results only by: MyChart Message (if you have MyChart) OR A paper copy in the mail If you have any lab test that is abnormal or we need to change your treatment, we will call you to review the results.   Testing/Procedures: None ordered   Follow-Up: At [facility], you and your health needs are our priority.  As part of our continuing mission to provide you with exceptional heart care, we have created designated Provider Care Teams.  These Care Teams include your primary Cardiologist (physician) and Advanced Practice Providers (APPs -  Physician Assistants and Nurse Practitioners) who all work together to provide you with the care you need, when you need it.  We recommend signing up for the patient portal called "MyChart".  Sign up information is provided on this After Visit Summary.  MyChart is used to connect with patients for Virtual Visits (Telemedicine).  Patients are able to view lab/test results, encounter notes, upcoming appointments, etc.  Non-urgent messages can be sent to your provider as well.   To learn more about what you can do with MyChart, go to [email or web address].    Your next appointment:   3 month(s)  Provider:    Donato Schultz, MD  Other Instructions

## 2022-12-21 NOTE — Assessment & Plan Note (Signed)
Nonobstructive coronary artery disease on CCTA in 2023. He is not having symptoms of chest pain to suggest angina. Labs from PCP were reviewed via [facility]. LDL in 07/2022 was optimal at 52. Continue ASA 81 mg once daily, Crestor 20 mg once daily, zetia 10 mg once daily.

## 2022-12-21 NOTE — Assessment & Plan Note (Signed)
His symptoms have improved off of Amlodipine and Telmisartan. His BPs are now higher. We will likely have to accept higher readings to avoid recurrent symptoms. I would like to try to get him closer to <140/90. I have recommended increasing Prazosin to 8 mg daily at bedtime. I have also encouraged him to wear compression stockings to avoid orthostatic intolerance. F/u with Dr. Anne Fu in several mos. If symptoms worsen or BP increases, f/u sooner.

## 2023-01-19 ENCOUNTER — Other Ambulatory Visit: Payer: Self-pay | Admitting: Behavioral Health

## 2023-01-19 DIAGNOSIS — F411 Generalized anxiety disorder: Secondary | ICD-10-CM

## 2023-01-19 DIAGNOSIS — F5105 Insomnia due to other mental disorder: Secondary | ICD-10-CM

## 2023-01-19 DIAGNOSIS — G4752 REM sleep behavior disorder: Secondary | ICD-10-CM

## 2023-02-02 ENCOUNTER — Ambulatory Visit: Payer: Medicare Other | Admitting: Behavioral Health

## 2023-02-02 DIAGNOSIS — M7989 Other specified soft tissue disorders: Secondary | ICD-10-CM | POA: Diagnosis not present

## 2023-02-03 ENCOUNTER — Ambulatory Visit: Payer: Medicare Other | Admitting: Internal Medicine

## 2023-02-22 ENCOUNTER — Other Ambulatory Visit: Payer: Self-pay | Admitting: Behavioral Health

## 2023-02-22 DIAGNOSIS — F411 Generalized anxiety disorder: Secondary | ICD-10-CM

## 2023-02-22 DIAGNOSIS — G4752 REM sleep behavior disorder: Secondary | ICD-10-CM

## 2023-02-22 DIAGNOSIS — F5105 Insomnia due to other mental disorder: Secondary | ICD-10-CM

## 2023-03-06 NOTE — Progress Notes (Signed)
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  Has 2 machines, so he missed nights on download or when he is using the other machine. No recurrence of pulmonary embolism since 2019. "Acting out dreams" thought recently that he was wrapped up in a rug and "kicking at bees".  He does not know family history of any possible dementia or Parkinson's disease. 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 CT cardiac morph 10/02/21- IMPRESSION: 1. Dilated main pulmonary artery segment without significant dilatation of the right or left pulmonary arteries. Component of pulmonary hypertension cannot be excluded. The patient is scheduled for echocardiographic assessment. 2. Evidence of hepatic steatosis.  03/08/23- 71 yoM former smoker followed for OSA, ?? RBD, Asbestos exposure, hxPE/ Eliquis 2019 , Urticaria, complicated by seasonal allergy, Glaucoma, GERD, bradycardia, BPH, Covid infection Dec 2022,  -Xanax, prednisone 5 mg daily, CPAP titration sleep study 08/26/22-> 10 cwp, sleep talking in REM (limited REM time) CPAP auto 10-20/ APS/ [employer]                          Has 2 machines Download- compliance  Body weight today- AirSense 11> 17%, AHI 1.5/hr He has seen a caregiver at [employer] who thought his sleep talking and motor  activity, associated with vivid dreams, might reflect PTSD experience in childhood.  Medications from that visit have calmed some of his motor activity.  We discussed his recent sleep study which indicated current CPAP settings are appropriate.  The study did document talking during REM, but REM time was limited and no motor activity was seen. Wife wakes him if he starts punching/kicking.  He and I today again discussed  the potential association of activity during REM with some increased risk of future dementia/Parkinson's.  I advised that future evaluation probably should be through a neurologist. Breathing has been comfortable during the daytime with no acute respiratory events. CTaChest PE 05/18/22- IMPRESSION: 1. No evidence for acute pulmonary embolism. 2. Coronary artery calcifications.  ROS-see HPI + = positive Constitutional:   No-   weight loss, night sweats, fevers, chills, fatigue, lassitude. HEENT:   +  headaches, +difficulty swallowing, tooth/dental problems, sore throat,       No-  sneezing, itching, ear ache, +nasal congestion, post nasal drip,  CV:  +  chest pain, orthopnea, PND, swelling in lower extremities, anasarca, dizziness, palpitations Resp: No-   shortness of breath with exertion or at rest.              No-   productive cough,  No non-productive cough,  No- coughing up of blood.              No-   change in color of mucus.  No- wheezing.   Skin: No-   rash or lesions. GI:  + heartburn,+ indigestion,  No-abdominal pain, nausea, vomiting,  GU: . MS:    +joint pain or swelling.  Marland Kitchen  Neuro-     nothing unusual Psych:  No- change in mood or affect. No depression, + anxiety.  No memory loss.  OBJ- Physical Exam   + = positive General- Alert, Oriented, Affect-appropriate, Distress- none acute, + overweight. +Small mandible. Skin- rash-none, lesions- none, excoriation- none Lymphadenopathy- none Head- atraumatic            Eyes- Gross vision intact, PERRLA, conjunctivae  and secretions clear            Ears- Hearing, canals-normal            Nose- Clear, no-Septal dev, mucus, polyps, erosion, perforation             Throat- Mallampati II , mucosa clear , drainage- none, tonsils- atrophic.                        Own teeth, + retrognathia Neck- flexible , trachea midline, no stridor , thyroid nl, carotid no bruit Chest - symmetrical excursion , unlabored           Heart/CV- RRR , no murmur , no gallop  , no rub, nl s1 s2                           - JVD- none , edema- none, stasis changes- none, varices- none           Lung- clear to P&A/ no crackles heard, wheeze- none, cough- none , dullness-none, rub- none, rales-none           Chest wall-  Abd-  Br/ Gen/ Rectal- Not done, not indicated Extrem- cyanosis- none, clubbing, none, atrophy- none, strength- nl Neuro- grossly intact to observation

## 2023-03-08 ENCOUNTER — Encounter: Payer: Self-pay | Admitting: Behavioral Health

## 2023-03-08 ENCOUNTER — Encounter: Payer: Self-pay | Admitting: Internal Medicine

## 2023-03-08 ENCOUNTER — Ambulatory Visit (INDEPENDENT_AMBULATORY_CARE_PROVIDER_SITE_OTHER): Payer: Medicare Other | Admitting: Internal Medicine

## 2023-03-08 ENCOUNTER — Ambulatory Visit: Payer: Medicare Other | Admitting: Behavioral Health

## 2023-03-08 ENCOUNTER — Telehealth: Payer: Self-pay | Admitting: Internal Medicine

## 2023-03-08 VITALS — BP 128/72 | HR 70 | Ht 67.0 in | Wt 192.8 lb

## 2023-03-08 DIAGNOSIS — F5105 Insomnia due to other mental disorder: Secondary | ICD-10-CM

## 2023-03-08 DIAGNOSIS — G4752 REM sleep behavior disorder: Secondary | ICD-10-CM | POA: Diagnosis not present

## 2023-03-08 DIAGNOSIS — F99 Mental disorder, not otherwise specified: Secondary | ICD-10-CM | POA: Diagnosis not present

## 2023-03-08 DIAGNOSIS — G4733 Obstructive sleep apnea (adult) (pediatric): Secondary | ICD-10-CM | POA: Diagnosis not present

## 2023-03-08 DIAGNOSIS — F411 Generalized anxiety disorder: Secondary | ICD-10-CM | POA: Diagnosis not present

## 2023-03-08 MED ORDER — QUETIAPINE FUMARATE ER 50 MG PO TB24: 50.0000 mg | ORAL_TABLET | Freq: Every day | ORAL | 1 refills | Status: DC

## 2023-03-08 MED ORDER — ESCITALOPRAM OXALATE 20 MG PO TABS: 20.0000 mg | ORAL_TABLET | Freq: Every day | ORAL | 1 refills | Status: DC

## 2023-03-08 NOTE — Patient Instructions (Signed)
We can continue CPAP  As discussed- if acting out in your dreams gets to be a problem, I would want you to see a neurologist.  Please call if we can help

## 2023-03-08 NOTE — Progress Notes (Signed)
[facility]  Patient ID: JANA MASTIN,  MRN: [id number]  PCP: Darrow Bussing, MD  Date of Evaluation: 03/08/2023 Time spent:30 minutes  Chief Complaint:  Chief Complaint   Anxiety; Follow-up; Medication Refill; Patient Education; Insomnia     HISTORY/CURRENT STATUS: HPI  "Omer" 72 year old male presents to this office for follow up and medication management. No new changes since last visit.    Says that his nightmares and vivid dreams have improved since starting the Prazosin. He feels more calm overall and is getting better quality sleep.  Overall, he is very happy so far with his medication.  He is having problems iwht right forearm pain. He is following up regularly with ortho. He and his wife have cut back on alcohol consumption. Says his depression level today is 2/10 and anxiety is 2/10. Says that he is sleeping 9-10 hours per night.  Patient says that he feels safe and has strong family support from his wife.  Denies any hx of mania, no psychosis, no current SI or HI.     Past psychiatric medications:   Belsomra-worsening of sleep behaviors Trazodone-nocturnal erections lasting more than 2 hours.  Individual Medical History/ Review of Systems: Changes? :No   Allergies: Levofloxacin, Suvorexant, Trazodone, Amoxicillin-pot clavulanate, Nsaids, and Tolmetin  Current Medications:  Current Outpatient Medications:    ALPRAZolam (XANAX) 1 MG tablet, Take 1 mg by mouth at bedtime as needed for anxiety. Take a 0.5 as needed, Disp: , Rfl:    aspirin EC 81 MG tablet, Take 81 mg by mouth daily., Disp: , Rfl:    cetirizine (ZYRTEC) 10 MG tablet, Take 10 mg by mouth daily., Disp: , Rfl:    Clobetasol Propionate (TEMOVATE) 0.05 % external spray, Apply topically 2 (two) times daily., Disp: , Rfl:    EPINEPHrine 0.3 mg/0.3 mL IJ SOAJ injection, , Disp: , Rfl:    escitalopram (LEXAPRO) 20 MG tablet, Take 1 tablet (20 mg total) by mouth daily., Disp: 90 tablet, Rfl:  1   ezetimibe (ZETIA) 10 MG tablet, Take 1 tablet by mouth once daily, Disp: 90 tablet, Rfl: 0   latanoprost (XALATAN) 0.005 % ophthalmic solution, Place 1 drop into both eyes at bedtime. 1 drop OU HS, Disp: , Rfl:    Multiple Vitamins-Minerals (PRESERVISION AREDS PO), Take 1 capsule by mouth daily., Disp: , Rfl:    omeprazole (PRILOSEC) 40 MG capsule, Take 40 mg by mouth daily., Disp: , Rfl:    prazosin (MINIPRESS) 2 MG capsule, Take 4 capsules (8 mg total) by mouth at bedtime., Disp: 360 capsule, Rfl: 3   predniSONE (DELTASONE) 5 MG tablet, 5 mg daily. Takes 5 mg total; 1/2 in AM & 1/2 in PM, Disp: , Rfl:    QUEtiapine (SEROQUEL XR) 50 MG TB24 24 hr tablet, Take 1 tablet (50 mg total) by mouth at bedtime., Disp: 90 tablet, Rfl: 1   rosuvastatin (CRESTOR) 20 MG tablet, TAKE 1 TABLET BY MOUTH EVERY DAY, Disp: 90 tablet, Rfl: 3   tacrolimus (PROTOPIC) 0.1 % ointment, Apply topically as needed., Disp: , Rfl:    valACYclovir (VALTREX) 500 MG tablet, as needed., Disp: , Rfl:  Medication Side Effects: none  Family Medical/ Social History: Changes? No  MENTAL HEALTH EXAM:  There were no vitals taken for this visit.There is no height or weight on file to calculate BMI.  General Appearance: Casual and Neat  Eye Contact:  Good  Speech:  Clear and Coherent  Volume:  Normal  Mood:  Depressed  Affect:  Appropriate  Thought Process:  Coherent  Orientation:  Full (Time, Place, and Person)  Thought Content: Logical   Suicidal Thoughts:  No  Homicidal Thoughts:  No  Memory:  WNL  Judgement:  Good  Insight:  Good  Psychomotor Activity:  Normal  Concentration:  Concentration: Good  Recall:  Good  Fund of Knowledge: Good  Language: Good  Assets:  Desire for Improvement  ADL's:  Intact  Cognition: WNL  Prognosis:  Good    DIAGNOSES:    ICD-10-CM   1. Generalized anxiety disorder  F41.1 escitalopram (LEXAPRO) 20 MG tablet    QUEtiapine (SEROQUEL XR) 50 MG TB24 24 hr tablet    2. 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 Says he has greatly improved with sleep. Reports nightmares and disturbing vivid dreams have decreased with use of Prazosin. He is happy with his current meds and request no changes this visit.  We agreed today: To continue Xanax 0.5 mg twice daily as needed for anxiety To continue Lexapro 20 mg daily To continue Seroquel to 50 mg daily Continue Prazosin 6 mg at bedtime.  To follow up in 12 weeks to reassess To report worsening symptoms promptly Provided emergency contact information Discussed potential metabolic side effects associated with atypical antipsychotics, as well as potential risk for movement side effects. Advised pt to contact office if movement side effects occur.   Discussed potential benefits, risk, and side effects of benzodiazepines to include potential risk of tolerance and dependence, as well as possible drowsiness.  Advised patient not to drive if experiencing drowsiness and to take lowest possible effective dose to minimize risk of dependence and tolerance.  Reviewed PDMP    Joan Flores, NP

## 2023-03-09 ENCOUNTER — Encounter: Payer: Self-pay | Admitting: Internal Medicine

## 2023-03-09 ENCOUNTER — Other Ambulatory Visit: Payer: Self-pay

## 2023-03-09 DIAGNOSIS — E7800 Pure hypercholesterolemia, unspecified: Secondary | ICD-10-CM | POA: Diagnosis not present

## 2023-03-09 DIAGNOSIS — R7301 Impaired fasting glucose: Secondary | ICD-10-CM | POA: Diagnosis not present

## 2023-03-09 DIAGNOSIS — H401331 Pigmentary glaucoma, bilateral, mild stage: Secondary | ICD-10-CM | POA: Diagnosis not present

## 2023-03-09 DIAGNOSIS — Z79899 Other long term (current) drug therapy: Secondary | ICD-10-CM | POA: Diagnosis not present

## 2023-03-09 DIAGNOSIS — Z86711 Personal history of pulmonary embolism: Secondary | ICD-10-CM | POA: Diagnosis not present

## 2023-03-09 DIAGNOSIS — F321 Major depressive disorder, single episode, moderate: Secondary | ICD-10-CM | POA: Diagnosis not present

## 2023-03-09 DIAGNOSIS — D84821 Immunodeficiency due to drugs: Secondary | ICD-10-CM | POA: Diagnosis not present

## 2023-03-09 DIAGNOSIS — G4752 REM sleep behavior disorder: Secondary | ICD-10-CM

## 2023-03-09 DIAGNOSIS — I10 Essential (primary) hypertension: Secondary | ICD-10-CM | POA: Diagnosis not present

## 2023-03-09 NOTE — Telephone Encounter (Signed)
Patient would like to move forward with seeing neurologist. I will place referral today. Dr. Maple Hudson what diagnosis do I need to use?

## 2023-03-09 NOTE — Telephone Encounter (Signed)
Dr. Maple Hudson please advise if your familiar with any neurologists that deal with parkinson's. Patient would like to know

## 2023-03-09 NOTE — Telephone Encounter (Signed)
REM Behavior Disorder, risk of Parkinson's

## 2023-03-09 NOTE — Assessment & Plan Note (Signed)
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 Plan-he will discuss with his PCP and may be call back here for referral.

## 2023-03-09 NOTE — Telephone Encounter (Signed)
Dr [facility]

## 2023-03-09 NOTE — Telephone Encounter (Signed)
Referral has been placed to neuro. Patient is aware. Closing encounter. NFN

## 2023-03-09 NOTE — Assessment & Plan Note (Signed)
From CPAP with good control.  He is using 2 machines so there are gaps in the download, but he uses CPAP every night. Plan-continue auto 10-20

## 2023-03-10 ENCOUNTER — Telehealth: Payer: Self-pay | Admitting: Neurology

## 2023-03-10 NOTE — Telephone Encounter (Signed)
We received a new referral for this patient. The wife is demanding he see Tat, not patel. After reviewing the other referral this is what Dr.Tat said. Pt will need to stay with Dr. Allena Katz since established with her but if Dr. Allena Katz feels she cannot handle issue and pt needs to transfer, we can certainly address that then The patients wife called and was very hateful, stating she is a Engineer, civil (consulting) and he is to see Dr.Tat. Dr.patel didn't help him and if she needs to come up here and speak with tat and patel together she will. I let her know they would not talk to her like that. I could not go over the doctors head, he would have to see patel and go from there but she was not listening. She kept saying I was going to sch him with Tat. That she has been doing research and he has Parkinsons. He is jerking himself off the bed and they end up in the ER.  Patients wife said she is fired. She said she didn't care about our office, our rules, personal preference and she can see who she wants to. All she cares about is her husband. A doctor told her she is to see tat and he needs to see tat. She said patel didn't help him.

## 2023-03-14 ENCOUNTER — Other Ambulatory Visit: Payer: Self-pay | Admitting: Cardiology

## 2023-03-14 NOTE — Telephone Encounter (Signed)
Noted. Patient will be dismissed, as per request.

## 2023-03-17 ENCOUNTER — Telehealth: Payer: Self-pay | Admitting: Neurology

## 2023-03-17 NOTE — Telephone Encounter (Signed)
As requested by patient, [facility] will no longer be able to provide medical care to you

## 2023-03-23 ENCOUNTER — Encounter: Payer: Self-pay | Admitting: Internal Medicine

## 2023-03-29 DIAGNOSIS — M064 Inflammatory polyarthropathy: Secondary | ICD-10-CM | POA: Diagnosis not present

## 2023-03-29 DIAGNOSIS — M130 Polyarthritis, unspecified: Secondary | ICD-10-CM | POA: Diagnosis not present

## 2023-03-29 DIAGNOSIS — Z7952 Long term (current) use of systemic steroids: Secondary | ICD-10-CM | POA: Diagnosis not present

## 2023-03-29 DIAGNOSIS — M353 Polymyalgia rheumatica: Secondary | ICD-10-CM | POA: Diagnosis not present

## 2023-05-02 ENCOUNTER — Encounter: Payer: Medicare Other | Admitting: Psychology

## 2023-05-13 ENCOUNTER — Ambulatory Visit: Payer: Medicare Other | Attending: Cardiology | Admitting: Cardiology

## 2023-05-13 ENCOUNTER — Encounter: Payer: Self-pay | Admitting: Cardiology

## 2023-05-13 VITALS — BP 134/88 | HR 56 | Ht 67.0 in | Wt 191.6 lb

## 2023-05-13 DIAGNOSIS — E7800 Pure hypercholesterolemia, unspecified: Secondary | ICD-10-CM | POA: Insufficient documentation

## 2023-05-13 DIAGNOSIS — I2510 Atherosclerotic heart disease of native coronary artery without angina pectoris: Secondary | ICD-10-CM | POA: Diagnosis not present

## 2023-05-13 DIAGNOSIS — I951 Orthostatic hypotension: Secondary | ICD-10-CM | POA: Insufficient documentation

## 2023-05-13 NOTE — Progress Notes (Signed)
Cardiology Office Note:  .   Date:  05/13/2023  ID:  [name], DOB 08-18-1950, MRN [id number] PCP: Anthony Bussing, MD  [facility] Providers Cardiologist:  Donato Schultz, MD     History of Present Illness: .   Anthony [name] is a 72 y.o. male Discussed the use of AI scribe software   History of Present Illness   The patient, with a history of nonobstructive coronary artery disease, hyperlipidemia, orthostatic hypotension, obstructive sleep apnea, and a prior pulmonary embolism, presents for follow-up. He reports improved symptoms after discontinuation of amlodipine and telmisartan, and has been monitoring his blood pressure at home. Nightmares are well-controlled with prazosin, which he has self-adjusted to 8mg  at night.  He experienced an episode of dizziness after floating in a pool for an extended period, similar to a previous fainting incident. He also reports a recent dental x-ray showed suspected calcium buildup in the carotid artery.  The patient's spouse has observed symptoms suggestive of non-movement Parkinson's, including bradycardia, orthostatic hypotension, double vision, loss of taste, altered thought processes, constipation, belching, pain, urinary incontinence, dry eyes, anxiety, depression, attention deficit, and repetitive finger motion. The patient has a history of working with heavy metals.  The patient's spouse has been actively involved in coordinating his care and has encountered difficulties in securing specialist appointments. I suggested considering seeking care at the [facility] in [place].            Studies Reviewed: Marland Kitchen   EKG Interpretation Date/Time:  Friday May 13 2023 08:48:30 EDT Ventricular Rate:  52 PR Interval:  184 QRS Duration:  90 QT Interval:  442 QTC Calculation: 411 R Axis:   -6  Text Interpretation: Sinus bradycardia Artifact Minimal voltage criteria for LVH, may be normal variant ( R in aVL )  Nonspecific ST abnormality When compared with ECG of 18-May-2022 11:42, No significant change since last tracing Confirmed by Donato Schultz ([id number]) on 05/13/2023 8:49:39 AM     Risk Assessment/Calculations:            Physical Exam:   VS:  BP 134/88   Pulse (!) 56   Ht 5\' 7"  (1.702 m)   Wt 191 lb 9.6 oz (86.9 kg)   SpO2 98%   BMI 30.01 kg/m    Wt Readings from Last 3 Encounters:  05/13/23 191 lb 9.6 oz (86.9 kg)  03/08/23 192 lb 12.8 oz (87.5 kg)  12/21/22 197 lb 9.6 oz (89.6 kg)    GEN: Well nourished, well developed in no acute distress NECK: No JVD; No carotid bruits CARDIAC: RRR, no murmurs, no rubs, no gallops RESPIRATORY:  Clear to auscultation without rales, wheezing or rhonchi  ABDOMEN: Soft, non-tender, non-distended EXTREMITIES:  No edema; No deformity   ASSESSMENT AND PLAN: .    Assessment and Plan    Orthostatic Hypotension Symptoms improved after discontinuation of amlodipine and telmisartan. Patient is monitoring blood pressure at home and is willing to accept higher readings. -Continue current management and home monitoring of blood pressure.  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 No formal evaluation or diagnosis has been made. -They attempted to see Dr. Arbutus Leas, a movement disorder specialist, for evaluation. -If referral is unsuccessful, consider evaluation at [facility].  Hyperlipidemia LDL controlled at 52 as of December 2023. Currently on Crestor 20mg  daily and Zetia 10mg  daily. -Continue current medication regimen.  Coronary Artery Disease Nonobstructive disease noted on coronary CT scan in 2023. Currently on  aspirin 81mg  daily. -Continue current medication regimen.  Obstructive Sleep Apnea Managed by Dr. Maple Hudson. No changes or concerns discussed in this visit. -Continue current management with Dr. Maple Hudson.  Nightmares Controlled with Prazosin 8mg  at  night. -Continue Prazosin 8mg  at night.  History of Pulmonary Embolism Treated with 6 months of Eliquis. No current concerns discussed in this visit. -No changes to current management.  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 Same disease process as patient's coronary artery disease. -No changes to current management. Continue current treatment for coronary artery disease.             Dispo: 1 yr  Signed, Donato Schultz, MD

## 2023-05-13 NOTE — Patient Instructions (Signed)
Medication Instructions:  The current medical regimen is effective;  continue present plan and medications.  *If you need a refill on your cardiac medications before your next appointment, please call your pharmacy*  Follow up with [facility].  Follow-Up: At [facility], you and your health needs are our priority.  As part of our continuing mission to provide you with exceptional heart care, we have created designated Provider Care Teams.  These Care Teams include your primary Cardiologist (physician) and Advanced Practice Providers (APPs -  Physician Assistants and Nurse Practitioners) who all work together to provide you with the care you need, when you need it.  We recommend signing up for the patient portal called "MyChart".  Sign up information is provided on this After Visit Summary.  MyChart is used to connect with patients for Virtual Visits (Telemedicine).  Patients are able to view lab/test results, encounter notes, upcoming appointments, etc.  Non-urgent messages can be sent to your provider as well.   To learn more about what you can do with MyChart, go to [email or web address].    Your next appointment:   1 year(s)  Provider:   Donato Schultz, MD

## 2023-05-14 ENCOUNTER — Other Ambulatory Visit: Payer: Self-pay | Admitting: Behavioral Health

## 2023-05-14 DIAGNOSIS — F5105 Insomnia due to other mental disorder: Secondary | ICD-10-CM

## 2023-05-15 NOTE — Telephone Encounter (Signed)
Verify dose. Last RF by another provider.

## 2023-05-18 ENCOUNTER — Encounter: Payer: Medicare Other | Admitting: Psychology

## 2023-05-18 NOTE — Telephone Encounter (Signed)
He is taking 4 caps at bed time (8mg  total)  Disp needs changed to 360; sig changed to take 4 tablets by mouth daily at bedtime

## 2023-05-18 NOTE — Telephone Encounter (Signed)
Please contact pt to verify his dose

## 2023-05-18 NOTE — Telephone Encounter (Signed)
Rf appropriate lv 03/08/23; nv 06/08/23; lf 03/19/23

## 2023-05-24 ENCOUNTER — Ambulatory Visit: Payer: Medicare Other | Admitting: Neurology

## 2023-06-08 ENCOUNTER — Ambulatory Visit (INDEPENDENT_AMBULATORY_CARE_PROVIDER_SITE_OTHER): Payer: Medicare Other | Admitting: Behavioral Health

## 2023-06-08 ENCOUNTER — Encounter: Payer: Self-pay | Admitting: Behavioral Health

## 2023-06-08 DIAGNOSIS — G4752 REM sleep behavior disorder: Secondary | ICD-10-CM

## 2023-06-08 DIAGNOSIS — F411 Generalized anxiety disorder: Secondary | ICD-10-CM

## 2023-06-08 DIAGNOSIS — F5105 Insomnia due to other mental disorder: Secondary | ICD-10-CM | POA: Diagnosis not present

## 2023-06-08 DIAGNOSIS — F99 Mental disorder, not otherwise specified: Secondary | ICD-10-CM

## 2023-06-08 NOTE — Progress Notes (Signed)
[facility]  Patient ID: Anthony [name],  MRN: [id number]  PCP: Darrow Bussing, MD  Date of Evaluation: 06/08/2023 Time spent:30 minutes  Chief Complaint:   HISTORY/CURRENT STATUS: HPI  "Anthony [name]" 72 year old male presents to this office for follow up and medication management. No new changes since last visit.   Says that his nightmares and vivid dreams have improved since starting the Prazosin. He feels more calm overall and is getting better quality sleep.  Overall, he is very happy so far with his medication.  He is still  having problems with right forearm pain. He is following up regularly with ortho.  Says his depression level today is 2/10 and anxiety is 2/10. Says that he is sleeping 9-10 hours per night.  Patient says that he feels safe and has strong family support from his wife.  Denies any hx of mania, no psychosis, no current SI or HI.     Past psychiatric medications:   Belsomra-worsening of sleep behaviors Trazodone-nocturnal erections lasting more than 2 hours.  Individual Medical History/ Review of Systems: Changes? :No   Allergies: Levofloxacin, Suvorexant, Trazodone, Amoxicillin-pot clavulanate, Nsaids, and Tolmetin  Current Medications:  Current Outpatient Medications:    ALPRAZolam (XANAX) 1 MG tablet, Take 1 mg by mouth at bedtime as needed for anxiety. Take a 0.5 as needed, Disp: , Rfl:    aspirin EC 81 MG tablet, Take 81 mg by mouth daily., Disp: , Rfl:    cetirizine (ZYRTEC) 10 MG tablet, Take 10 mg by mouth daily., Disp: , Rfl:    Clobetasol Propionate (TEMOVATE) 0.05 % external spray, Apply topically 2 (two) times daily., Disp: , Rfl:    EPINEPHrine 0.3 mg/0.3 mL IJ SOAJ injection, , Disp: , Rfl:    escitalopram (LEXAPRO) 20 MG tablet, Take 1 tablet (20 mg total) by mouth daily., Disp: 90 tablet, Rfl: 1   ezetimibe (ZETIA) 10 MG tablet, Take 1 tablet by mouth once daily, Disp: 90 tablet, Rfl: 3   latanoprost (XALATAN) 0.005 % ophthalmic  solution, Place 1 drop into both eyes at bedtime. 1 drop OU HS, Disp: , Rfl:    leflunomide (ARAVA) 10 MG tablet, Take 10 mg by mouth daily., Disp: , Rfl:    Multiple Vitamins-Minerals (PRESERVISION AREDS PO), Take 1 capsule by mouth daily., Disp: , Rfl:    omeprazole (PRILOSEC) 40 MG capsule, Take 40 mg by mouth daily., Disp: , Rfl:    prazosin (MINIPRESS) 2 MG capsule, Take 4 TABLETS BY MOUTH DAILY AT BEDTIME., Disp: 360 capsule, Rfl: 1   predniSONE (DELTASONE) 5 MG tablet, 5 mg daily. Takes 5 mg total; 1/2 in AM & 1/2 in PM, Disp: , Rfl:    QUEtiapine (SEROQUEL XR) 50 MG TB24 24 hr tablet, Take 1 tablet (50 mg total) by mouth at bedtime., Disp: 90 tablet, Rfl: 1   rosuvastatin (CRESTOR) 20 MG tablet, TAKE 1 TABLET BY MOUTH EVERY DAY, Disp: 90 tablet, Rfl: 3   tacrolimus (PROTOPIC) 0.1 % ointment, Apply topically as needed., Disp: , Rfl:    valACYclovir (VALTREX) 500 MG tablet, as needed., Disp: , Rfl:  Medication Side Effects: none  Family Medical/ Social History: Changes? No  MENTAL HEALTH EXAM:  There were no vitals taken for this visit.There is no height or weight on file to calculate BMI.  General Appearance: Casual, Neat, and Well Groomed  Eye Contact:  Good  Speech:  Clear and Coherent  Volume:  Normal  Mood:  NA  Affect:  Appropriate  Thought  Process:  Coherent  Orientation:  Full (Time, Place, and Person)  Thought Content: Logical   Suicidal Thoughts:  No  Homicidal Thoughts:  No  Memory:  WNL  Judgement:  Good  Insight:  Good  Psychomotor Activity:  Normal  Concentration:  Concentration: Good  Recall:  Good  Fund of Knowledge: Good  Language: Good  Assets:  Desire for Improvement  ADL's:  Intact  Cognition: WNL  Prognosis:  Good    DIAGNOSES: No diagnosis found.  Receiving Psychotherapy: No    RECOMMENDATIONS:   Greater than 50% of face to face time with patient was spent on counseling and coordination of care. Says he has greatly improved with sleep.  Reports nightmares and disturbing vivid dreams have decreased with use of Prazosin. He is happy with his current meds and request no changes this visit.  We agreed today: To continue Xanax 0.5 mg twice daily as needed for anxiety To continue Lexapro 20 mg daily To continue Seroquel to 50 mg daily Continue Prazosin 6 mg at bedtime.  To follow up in 12 weeks to reassess To report worsening symptoms promptly Provided emergency contact information Discussed potential metabolic side effects associated with atypical antipsychotics, as well as potential risk for movement side effects. Advised pt to contact office if movement side effects occur.   Discussed potential benefits, risk, and side effects of benzodiazepines to include potential risk of tolerance and dependence, as well as possible drowsiness.  Advised patient not to drive if experiencing drowsiness and to take lowest possible effective dose to minimize risk of dependence and tolerance.  Reviewed PDMP Joan Flores, NP

## 2023-06-09 ENCOUNTER — Other Ambulatory Visit: Payer: Self-pay

## 2023-06-09 DIAGNOSIS — G4752 REM sleep behavior disorder: Secondary | ICD-10-CM

## 2023-06-09 NOTE — Progress Notes (Signed)
Placed a order for patient to be seen with Dr.Post for REM . Nothing else further needed

## 2023-06-22 DIAGNOSIS — M19031 Primary osteoarthritis, right wrist: Secondary | ICD-10-CM | POA: Diagnosis not present

## 2023-06-23 DIAGNOSIS — M353 Polymyalgia rheumatica: Secondary | ICD-10-CM | POA: Diagnosis not present

## 2023-06-23 DIAGNOSIS — D84821 Immunodeficiency due to drugs: Secondary | ICD-10-CM | POA: Diagnosis not present

## 2023-06-23 DIAGNOSIS — M064 Inflammatory polyarthropathy: Secondary | ICD-10-CM | POA: Diagnosis not present

## 2023-06-23 DIAGNOSIS — Z79899 Other long term (current) drug therapy: Secondary | ICD-10-CM | POA: Diagnosis not present

## 2023-06-28 ENCOUNTER — Telehealth: Payer: Self-pay | Admitting: Behavioral Health

## 2023-06-28 DIAGNOSIS — F411 Generalized anxiety disorder: Secondary | ICD-10-CM

## 2023-06-28 DIAGNOSIS — Z79899 Other long term (current) drug therapy: Secondary | ICD-10-CM | POA: Insufficient documentation

## 2023-06-28 DIAGNOSIS — F5105 Insomnia due to other mental disorder: Secondary | ICD-10-CM

## 2023-06-28 DIAGNOSIS — G4752 REM sleep behavior disorder: Secondary | ICD-10-CM

## 2023-06-28 DIAGNOSIS — Z7969 Long term (current) use of other immunomodulators and immunosuppressants: Secondary | ICD-10-CM | POA: Insufficient documentation

## 2023-06-28 MED ORDER — QUETIAPINE FUMARATE ER 50 MG PO TB24
50.0000 mg | ORAL_TABLET | Freq: Every day | ORAL | 0 refills | Status: DC
Start: 2023-06-28 — End: 2023-09-08

## 2023-06-28 NOTE — Telephone Encounter (Signed)
Sent 30-day supply of Seroquel to the requested pharmacy.

## 2023-06-28 NOTE — Telephone Encounter (Signed)
Anthony [name]'s wife called today and LM at 12:40pm. They have gone to [place] and he left his Seroquel XR 50mg  at home. Can we send in #30 of Seroquelfor him while there:  Send to [facility] at [address], [place], [place]  [address] phone# [phone].

## 2023-07-19 DIAGNOSIS — H33301 Unspecified retinal break, right eye: Secondary | ICD-10-CM | POA: Diagnosis not present

## 2023-07-19 DIAGNOSIS — H401331 Pigmentary glaucoma, bilateral, mild stage: Secondary | ICD-10-CM | POA: Diagnosis not present

## 2023-07-25 DIAGNOSIS — Z0001 Encounter for general adult medical examination with abnormal findings: Secondary | ICD-10-CM | POA: Diagnosis not present

## 2023-07-25 DIAGNOSIS — E7800 Pure hypercholesterolemia, unspecified: Secondary | ICD-10-CM | POA: Diagnosis not present

## 2023-07-25 DIAGNOSIS — Z23 Encounter for immunization: Secondary | ICD-10-CM | POA: Diagnosis not present

## 2023-07-25 DIAGNOSIS — D84821 Immunodeficiency due to drugs: Secondary | ICD-10-CM | POA: Diagnosis not present

## 2023-07-25 DIAGNOSIS — I10 Essential (primary) hypertension: Secondary | ICD-10-CM | POA: Diagnosis not present

## 2023-07-25 DIAGNOSIS — F321 Major depressive disorder, single episode, moderate: Secondary | ICD-10-CM | POA: Diagnosis not present

## 2023-07-25 DIAGNOSIS — R7301 Impaired fasting glucose: Secondary | ICD-10-CM | POA: Diagnosis not present

## 2023-07-25 DIAGNOSIS — Z79899 Other long term (current) drug therapy: Secondary | ICD-10-CM | POA: Diagnosis not present

## 2023-07-25 DIAGNOSIS — Z86711 Personal history of pulmonary embolism: Secondary | ICD-10-CM | POA: Diagnosis not present

## 2023-08-01 DIAGNOSIS — R748 Abnormal levels of other serum enzymes: Secondary | ICD-10-CM | POA: Diagnosis not present

## 2023-08-18 DIAGNOSIS — E119 Type 2 diabetes mellitus without complications: Secondary | ICD-10-CM | POA: Diagnosis not present

## 2023-08-24 DIAGNOSIS — H401331 Pigmentary glaucoma, bilateral, mild stage: Secondary | ICD-10-CM | POA: Diagnosis not present

## 2023-08-24 DIAGNOSIS — H532 Diplopia: Secondary | ICD-10-CM | POA: Diagnosis not present

## 2023-08-24 DIAGNOSIS — Z961 Presence of intraocular lens: Secondary | ICD-10-CM | POA: Diagnosis not present

## 2023-08-24 DIAGNOSIS — H5201 Hypermetropia, right eye: Secondary | ICD-10-CM | POA: Diagnosis not present

## 2023-08-31 DIAGNOSIS — R053 Chronic cough: Secondary | ICD-10-CM | POA: Diagnosis not present

## 2023-08-31 DIAGNOSIS — K219 Gastro-esophageal reflux disease without esophagitis: Secondary | ICD-10-CM | POA: Diagnosis not present

## 2023-09-08 ENCOUNTER — Ambulatory Visit: Payer: Medicare Other | Admitting: Behavioral Health

## 2023-09-08 ENCOUNTER — Other Ambulatory Visit: Payer: Self-pay | Admitting: Behavioral Health

## 2023-09-08 ENCOUNTER — Encounter: Payer: Self-pay | Admitting: Behavioral Health

## 2023-09-08 DIAGNOSIS — F5105 Insomnia due to other mental disorder: Secondary | ICD-10-CM

## 2023-09-08 DIAGNOSIS — F411 Generalized anxiety disorder: Secondary | ICD-10-CM

## 2023-09-08 DIAGNOSIS — F99 Mental disorder, not otherwise specified: Secondary | ICD-10-CM

## 2023-09-08 DIAGNOSIS — Z860101 Personal history of adenomatous and serrated colon polyps: Secondary | ICD-10-CM | POA: Diagnosis not present

## 2023-09-08 DIAGNOSIS — K219 Gastro-esophageal reflux disease without esophagitis: Secondary | ICD-10-CM | POA: Diagnosis not present

## 2023-09-08 DIAGNOSIS — G4752 REM sleep behavior disorder: Secondary | ICD-10-CM | POA: Diagnosis not present

## 2023-09-08 DIAGNOSIS — R1310 Dysphagia, unspecified: Secondary | ICD-10-CM | POA: Diagnosis not present

## 2023-09-08 MED ORDER — PRAZOSIN HCL 2 MG PO CAPS: ORAL_CAPSULE | ORAL | 1 refills | Status: DC

## 2023-09-08 MED ORDER — ESCITALOPRAM OXALATE 20 MG PO TABS: 20.0000 mg | ORAL_TABLET | Freq: Every day | ORAL | 1 refills | Status: DC

## 2023-09-08 MED ORDER — HYDROXYZINE HCL 25 MG PO TABS: 25.0000 mg | ORAL_TABLET | Freq: Three times a day (TID) | ORAL | 1 refills | Status: DC | PRN

## 2023-09-08 MED ORDER — QUETIAPINE FUMARATE ER 50 MG PO TB24: 50.0000 mg | ORAL_TABLET | Freq: Every day | ORAL | 1 refills | Status: DC

## 2023-09-08 NOTE — Telephone Encounter (Signed)
Has appt today

## 2023-09-08 NOTE — Progress Notes (Signed)
[facility]  Patient ID: TORRI [name],  MRN: [id number]  PCP: Darrow Bussing, MD  Date of Evaluation: 09/08/2023 Time spent:30 minutes  Chief Complaint:  Chief Complaint   Anxiety; Follow-up; Patient Education; Medication Refill     HISTORY/CURRENT STATUS: HPI "Anthony [name]" 73 year old male presents to this office for follow up and medication management. Says that his medication are working well overall but he is having some increase irritability and agitation in the late afternoon and early evening. Says that he and his wife find this a risky time for arguments. He is inquiring about medication that he can take as needed that may help other than controlled substance.   Says that his nightmares and vivid dreams have improved since continuing  the Prazosin.  Says his depression level today is 2/10 and anxiety is 4/10. Says that he is sleeping 9-10 hours per night.  Patient says that he feels safe and has strong family support from his wife.  Denies any hx of mania, no psychosis, no current SI or HI.     Past psychiatric medications:   Belsomra-worsening of sleep behaviors Trazodone-nocturnal erections lasting more than 2 hours. Individual Medical History/ Review of Systems: Changes? :No   Allergies: Levofloxacin, Suvorexant, Trazodone, Amoxicillin-pot clavulanate, Nsaids, and Tolmetin  Current Medications:  Current Outpatient Medications:    ALPRAZolam (XANAX) 1 MG tablet, Take 1 mg by mouth at bedtime as needed for anxiety. Take a 0.5 as needed, Disp: , Rfl:    aspirin EC 81 MG tablet, Take 81 mg by mouth daily., Disp: , Rfl:    cetirizine (ZYRTEC) 10 MG tablet, Take 10 mg by mouth daily., Disp: , Rfl:    Clobetasol Propionate (TEMOVATE) 0.05 % external spray, Apply topically 2 (two) times daily., Disp: , Rfl:    EPINEPHrine 0.3 mg/0.3 mL IJ SOAJ injection, , Disp: , Rfl:    escitalopram (LEXAPRO) 20 MG tablet, Take 1 tablet (20 mg total) by mouth daily., Disp: 90  tablet, Rfl: 1   ezetimibe (ZETIA) 10 MG tablet, Take 1 tablet by mouth once daily, Disp: 90 tablet, Rfl: 3   latanoprost (XALATAN) 0.005 % ophthalmic solution, Place 1 drop into both eyes at bedtime. 1 drop OU HS, Disp: , Rfl:    leflunomide (ARAVA) 10 MG tablet, Take 10 mg by mouth daily., Disp: , Rfl:    Multiple Vitamins-Minerals (PRESERVISION AREDS PO), Take 1 capsule by mouth daily., Disp: , Rfl:    omeprazole (PRILOSEC) 40 MG capsule, Take 40 mg by mouth daily., Disp: , Rfl:    prazosin (MINIPRESS) 2 MG capsule, Take 4 TABLETS BY MOUTH DAILY AT BEDTIME., Disp: 360 capsule, Rfl: 1   predniSONE (DELTASONE) 5 MG tablet, 5 mg daily. Takes 5 mg total; 1/2 in AM & 1/2 in PM, Disp: , Rfl:    QUEtiapine (SEROQUEL XR) 50 MG TB24 24 hr tablet, Take 1 tablet (50 mg total) by mouth at bedtime., Disp: 30 tablet, Rfl: 0   rosuvastatin (CRESTOR) 20 MG tablet, TAKE 1 TABLET BY MOUTH EVERY DAY, Disp: 90 tablet, Rfl: 3   tacrolimus (PROTOPIC) 0.1 % ointment, Apply topically as needed., Disp: , Rfl:    valACYclovir (VALTREX) 500 MG tablet, as needed., Disp: , Rfl:  Medication Side Effects: none  Family Medical/ Social History: Changes? No  MENTAL HEALTH EXAM:  There were no vitals taken for this visit.There is no height or weight on file to calculate BMI.  General Appearance: Casual, Neat, and Well Groomed  Eye Contact:  Good  Speech:  Clear and Coherent  Volume:  Normal  Mood:  NA  Affect:  Appropriate  Thought Process:  Coherent  Orientation:  Full (Time, Place, and Person)  Thought Content: Logical   Suicidal Thoughts:  No  Homicidal Thoughts:  No  Memory:  WNL  Judgement:  Good  Insight:  Good  Psychomotor Activity:  Normal  Concentration:  Concentration: Good  Recall:  Good  Fund of Knowledge: Good  Language: Good  Assets:  Desire for Improvement  ADL's:  Intact  Cognition: WNL  Prognosis:  Good    DIAGNOSES:    ICD-10-CM   1. Generalized anxiety disorder  F41.1     2.  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 Insomnia due to other mental disorder (CODE)  F51.05       Receiving Psychotherapy: No    RECOMMENDATIONS:   Greater than 50% of face to face time with patient was spent on counseling and coordination of care. Says he has greatly improved with sleep. Reports nightmares and disturbing vivid dreams have decreased with use of Prazosin. He is happy with his current meds and request no changes this visit.  We agreed today: To start hydroxyzine 25 mg three times daily as needed for agitation, or increased anxiety.  To continue Xanax 0.5 mg twice daily as needed for anxiety To continue Lexapro 20 mg daily To continue Seroquel to 50 mg daily Continue Prazosin 6 mg at bedtime.  To follow up in 12 weeks to reassess To report worsening symptoms promptly Provided emergency contact information Discussed potential metabolic side effects associated with atypical antipsychotics, as well as potential risk for movement side effects. Advised pt to contact office if movement side effects occur.   Discussed potential benefits, risk, and side effects of benzodiazepines to include potential risk of tolerance and dependence, as well as possible drowsiness.  Advised patient not to drive if experiencing drowsiness and to take lowest possible effective dose to minimize risk of dependence and tolerance.  Reviewed PDMP Joan Flores, NP                   Joan Flores, NP

## 2023-09-22 DIAGNOSIS — R350 Frequency of micturition: Secondary | ICD-10-CM | POA: Diagnosis not present

## 2023-09-22 DIAGNOSIS — N401 Enlarged prostate with lower urinary tract symptoms: Secondary | ICD-10-CM | POA: Diagnosis not present

## 2023-09-22 DIAGNOSIS — R351 Nocturia: Secondary | ICD-10-CM | POA: Diagnosis not present

## 2023-09-22 DIAGNOSIS — N3941 Urge incontinence: Secondary | ICD-10-CM | POA: Diagnosis not present

## 2023-09-23 DIAGNOSIS — K219 Gastro-esophageal reflux disease without esophagitis: Secondary | ICD-10-CM | POA: Diagnosis not present

## 2023-09-23 DIAGNOSIS — E119 Type 2 diabetes mellitus without complications: Secondary | ICD-10-CM | POA: Diagnosis not present

## 2023-09-30 ENCOUNTER — Other Ambulatory Visit: Payer: Self-pay | Admitting: Behavioral Health

## 2023-09-30 DIAGNOSIS — F411 Generalized anxiety disorder: Secondary | ICD-10-CM

## 2023-10-07 DIAGNOSIS — K2289 Other specified disease of esophagus: Secondary | ICD-10-CM | POA: Diagnosis not present

## 2023-10-07 DIAGNOSIS — Z860100 Personal history of colon polyps, unspecified: Secondary | ICD-10-CM | POA: Diagnosis not present

## 2023-10-07 DIAGNOSIS — Z8719 Personal history of other diseases of the digestive system: Secondary | ICD-10-CM | POA: Diagnosis not present

## 2023-10-07 DIAGNOSIS — K648 Other hemorrhoids: Secondary | ICD-10-CM | POA: Diagnosis not present

## 2023-10-07 DIAGNOSIS — Z09 Encounter for follow-up examination after completed treatment for conditions other than malignant neoplasm: Secondary | ICD-10-CM | POA: Diagnosis not present

## 2023-10-07 DIAGNOSIS — K319 Disease of stomach and duodenum, unspecified: Secondary | ICD-10-CM | POA: Diagnosis not present

## 2023-10-07 DIAGNOSIS — K219 Gastro-esophageal reflux disease without esophagitis: Secondary | ICD-10-CM | POA: Diagnosis not present

## 2023-10-07 DIAGNOSIS — R1310 Dysphagia, unspecified: Secondary | ICD-10-CM | POA: Diagnosis not present

## 2023-10-10 DIAGNOSIS — L821 Other seborrheic keratosis: Secondary | ICD-10-CM | POA: Diagnosis not present

## 2023-10-10 DIAGNOSIS — Z808 Family history of malignant neoplasm of other organs or systems: Secondary | ICD-10-CM | POA: Diagnosis not present

## 2023-10-10 DIAGNOSIS — L578 Other skin changes due to chronic exposure to nonionizing radiation: Secondary | ICD-10-CM | POA: Diagnosis not present

## 2023-10-10 DIAGNOSIS — D225 Melanocytic nevi of trunk: Secondary | ICD-10-CM | POA: Diagnosis not present

## 2023-10-10 DIAGNOSIS — L814 Other melanin hyperpigmentation: Secondary | ICD-10-CM | POA: Diagnosis not present

## 2023-10-18 ENCOUNTER — Other Ambulatory Visit: Payer: Self-pay | Admitting: Cardiology

## 2023-10-19 ENCOUNTER — Telehealth: Payer: Self-pay | Admitting: Cardiology

## 2023-10-19 ENCOUNTER — Encounter: Payer: Self-pay | Admitting: Cardiology

## 2023-10-19 NOTE — Telephone Encounter (Signed)
 Spoke with pt's wife (per DPR) regarding the pt's blood pressure readings sent to [place] via MyChart (note copied below). Wife stated that the pt's blood pressure readings were taken over the last couple of days. The pt's only symptom is a headache. It was suggested that they continue taking the pt's blood pressures over the rest of the week to monitor. Pt and wife were told that his readings would be sent to Dr. Anne Fu and nurse for advice.  See note 09/21/22. Amylodipine 5mg  and Telmisartan 20 mg d/c. Increased Prazosin to 8 mg. Does Trey Paula need to take new med. Gemtesa only new med 1 month. They want to switch to Mybetrig. BP 182/93, 165/87, 155/88, 167/87, 144/71, 153/93, 141/96, 151/80, 166/93, 181/93, 162/93.

## 2023-10-19 NOTE — Telephone Encounter (Signed)
 Refer to phone note on 3/12 for further information

## 2023-10-19 NOTE — Telephone Encounter (Signed)
 Pt's wife is requesting a callback regarding her MyChart message. Please advise

## 2023-10-20 MED ORDER — TELMISARTAN 20 MG PO TABS: 20.0000 mg | ORAL_TABLET | Freq: Every day | ORAL | 3 refills | Status: DC

## 2023-10-20 NOTE — Telephone Encounter (Signed)
 Spoke with pt's wife (per DPR) and let her know that Dr. Anne Fu would like the pt to restart Telmisartan 20 mg daily. An order for Telmisartan was placed and sent to the pt's pharmacy of choice. Wife verbalized understanding. All questions, if any, were answered.

## 2023-10-25 DIAGNOSIS — M19031 Primary osteoarthritis, right wrist: Secondary | ICD-10-CM | POA: Diagnosis not present

## 2023-10-25 DIAGNOSIS — M654 Radial styloid tenosynovitis [de Quervain]: Secondary | ICD-10-CM | POA: Diagnosis not present

## 2023-10-25 DIAGNOSIS — M65331 Trigger finger, right middle finger: Secondary | ICD-10-CM | POA: Diagnosis not present

## 2023-10-27 ENCOUNTER — Other Ambulatory Visit: Payer: Self-pay | Admitting: Behavioral Health

## 2023-10-27 DIAGNOSIS — N3941 Urge incontinence: Secondary | ICD-10-CM | POA: Diagnosis not present

## 2023-10-27 DIAGNOSIS — R5382 Chronic fatigue, unspecified: Secondary | ICD-10-CM | POA: Diagnosis not present

## 2023-10-27 DIAGNOSIS — F411 Generalized anxiety disorder: Secondary | ICD-10-CM

## 2023-10-27 DIAGNOSIS — N401 Enlarged prostate with lower urinary tract symptoms: Secondary | ICD-10-CM | POA: Diagnosis not present

## 2023-10-27 DIAGNOSIS — R351 Nocturia: Secondary | ICD-10-CM | POA: Diagnosis not present

## 2023-11-01 DIAGNOSIS — M064 Inflammatory polyarthropathy: Secondary | ICD-10-CM | POA: Diagnosis not present

## 2023-11-01 DIAGNOSIS — Z79899 Other long term (current) drug therapy: Secondary | ICD-10-CM | POA: Diagnosis not present

## 2023-11-01 DIAGNOSIS — M353 Polymyalgia rheumatica: Secondary | ICD-10-CM | POA: Diagnosis not present

## 2023-11-10 DIAGNOSIS — R7401 Elevation of levels of liver transaminase levels: Secondary | ICD-10-CM | POA: Diagnosis not present

## 2023-11-21 ENCOUNTER — Encounter: Payer: Self-pay | Admitting: Behavioral Health

## 2023-11-21 ENCOUNTER — Ambulatory Visit: Payer: Medicare Other | Admitting: Behavioral Health

## 2023-11-21 DIAGNOSIS — F99 Mental disorder, not otherwise specified: Secondary | ICD-10-CM

## 2023-11-21 DIAGNOSIS — F5105 Insomnia due to other mental disorder: Secondary | ICD-10-CM | POA: Diagnosis not present

## 2023-11-21 DIAGNOSIS — F411 Generalized anxiety disorder: Secondary | ICD-10-CM

## 2023-11-21 DIAGNOSIS — G4752 REM sleep behavior disorder: Secondary | ICD-10-CM | POA: Diagnosis not present

## 2023-11-21 NOTE — Progress Notes (Signed)
 [facility]  Patient ID: Anthony [name],  MRN: [id number]  PCP: Anthony Bussing, MD  Date of Evaluation: 11/21/2023 Time spent:30 minutes  Chief Complaint:  Chief Complaint   Anxiety; Follow-up; Medication Refill; Patient Education     HISTORY/CURRENT STATUS: HPI "Anthony [name]" 73 year old male presents to this office for follow up and medication management. Says that his medication are working well overall. Gets agitated occasionally.   Says he is getting along with wife better.   Says that his nightmares and vivid dreams have improved since continuing  the Prazosin.  Says his depression level today is 2/10 and anxiety is 4/10. Says that he is sleeping 9-10 hours per night.  Patient says that he feels safe and has strong family support from his wife.  Denies any hx of mania, no psychosis, no current SI or HI.     Past psychiatric medications:   Individual Medical History/ Review of Systems: Changes? :No   Allergies: Levofloxacin, Suvorexant, Trazodone, Amoxicillin-pot clavulanate, Nsaids, and Tolmetin  Current Medications:  Current Outpatient Medications:    ALPRAZolam (XANAX) 1 MG tablet, Take 1 mg by mouth at bedtime as needed for anxiety. Take a 0.5 as needed, Disp: , Rfl:    aspirin EC 81 MG tablet, Take 81 mg by mouth daily., Disp: , Rfl:    cetirizine (ZYRTEC) 10 MG tablet, Take 10 mg by mouth daily., Disp: , Rfl:    Clobetasol Propionate (TEMOVATE) 0.05 % external spray, Apply topically 2 (two) times daily., Disp: , Rfl:    EPINEPHrine 0.3 mg/0.3 mL IJ SOAJ injection, , Disp: , Rfl:    escitalopram (LEXAPRO) 20 MG tablet, Take 1 tablet (20 mg total) by mouth daily., Disp: 90 tablet, Rfl: 1   ezetimibe (ZETIA) 10 MG tablet, Take 1 tablet by mouth once daily, Disp: 90 tablet, Rfl: 3   hydrOXYzine (ATARAX) 25 MG tablet, TAKE 1 TABLET BY MOUTH THREE TIMES A DAY AS NEEDED, Disp: 270 tablet, Rfl: 1   latanoprost (XALATAN) 0.005 % ophthalmic solution, Place 1 drop  into both eyes at bedtime. 1 drop OU HS, Disp: , Rfl:    leflunomide (ARAVA) 10 MG tablet, Take 10 mg by mouth daily., Disp: , Rfl:    Multiple Vitamins-Minerals (PRESERVISION AREDS PO), Take 1 capsule by mouth daily., Disp: , Rfl:    omeprazole (PRILOSEC) 40 MG capsule, Take 40 mg by mouth daily., Disp: , Rfl:    prazosin (MINIPRESS) 2 MG capsule, Take 4 TABLETS BY MOUTH DAILY AT BEDTIME., Disp: 360 capsule, Rfl: 1   predniSONE (DELTASONE) 5 MG tablet, 5 mg daily. Takes 5 mg total; 1/2 in AM & 1/2 in PM, Disp: , Rfl:    QUEtiapine (SEROQUEL XR) 50 MG TB24 24 hr tablet, Take 1 tablet (50 mg total) by mouth at bedtime., Disp: 90 tablet, Rfl: 1   rosuvastatin (CRESTOR) 20 MG tablet, TAKE 1 TABLET BY MOUTH EVERY DAY, Disp: 90 tablet, Rfl: 2   tacrolimus (PROTOPIC) 0.1 % ointment, Apply topically as needed., Disp: , Rfl:    telmisartan (MICARDIS) 20 MG tablet, Take 1 tablet (20 mg total) by mouth daily., Disp: 90 tablet, Rfl: 3   valACYclovir (VALTREX) 500 MG tablet, as needed., Disp: , Rfl:  Medication Side Effects: none  Family Medical/ Social History: Changes? No  MENTAL HEALTH EXAM:  There were no vitals taken for this visit.There is no height or weight on file to calculate BMI.  General Appearance: Casual, Neat, and Well Groomed  Eye Contact:  Good  Speech:  Clear and Coherent  Volume:  Normal  Mood:  NA  Affect:  Appropriate  Thought Process:  Coherent  Orientation:  Full (Time, Place, and Person)  Thought Content: Logical   Suicidal Thoughts:  No  Homicidal Thoughts:  No  Memory:  WNL  Judgement:  Good  Insight:  Good  Psychomotor Activity:  Normal  Concentration:  Concentration: Good  Recall:  Good  Fund of Knowledge: Good  Language: Good  Assets:  Desire for Improvement  ADL's:  Intact  Cognition: WNL  Prognosis:  Good    DIAGNOSES:    ICD-10-CM   1. Generalized anxiety disorder  F41.1     2. Insomnia due to other mental disorder  F51.05    F99     3. 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 Says he has greatly improved with sleep. Reports nightmares and disturbing vivid dreams have decreased with use of Prazosin. He is happy with his current meds and request no changes this visit.  We agreed today: To continue hydroxyzine 25 mg three times daily as needed for agitation, or increased anxiety.  To continue Xanax 0.5 mg twice daily as needed for anxiety To continue Lexapro 20 mg daily To continue Seroquel to 50 mg daily Continue Prazosin 6 mg at bedtime.  To follow up in 12 weeks to reassess To report worsening symptoms promptly Provided emergency contact information Discussed potential metabolic side effects associated with atypical antipsychotics, as well as potential risk for movement side effects. Advised pt to contact office if movement side effects occur.   Discussed potential benefits, risk, and side effects of benzodiazepines to include potential risk of tolerance and dependence, as well as possible drowsiness.  Advised patient not to drive if experiencing drowsiness and to take lowest possible effective dose to minimize risk of dependence and tolerance.  Reviewed PDMP Lincoln Renshaw, NP               Lincoln Renshaw, NP

## 2023-12-01 DIAGNOSIS — Z79899 Other long term (current) drug therapy: Secondary | ICD-10-CM | POA: Diagnosis not present

## 2023-12-01 DIAGNOSIS — M5442 Lumbago with sciatica, left side: Secondary | ICD-10-CM | POA: Diagnosis not present

## 2023-12-01 DIAGNOSIS — M5416 Radiculopathy, lumbar region: Secondary | ICD-10-CM | POA: Diagnosis not present

## 2023-12-01 DIAGNOSIS — G5702 Lesion of sciatic nerve, left lower limb: Secondary | ICD-10-CM | POA: Diagnosis not present

## 2023-12-01 DIAGNOSIS — G8929 Other chronic pain: Secondary | ICD-10-CM | POA: Diagnosis not present

## 2023-12-08 DIAGNOSIS — M5126 Other intervertebral disc displacement, lumbar region: Secondary | ICD-10-CM | POA: Diagnosis not present

## 2023-12-08 DIAGNOSIS — M4713 Other spondylosis with myelopathy, cervicothoracic region: Secondary | ICD-10-CM | POA: Diagnosis not present

## 2023-12-08 DIAGNOSIS — M9963 Osseous and subluxation stenosis of intervertebral foramina of lumbar region: Secondary | ICD-10-CM | POA: Diagnosis not present

## 2023-12-08 DIAGNOSIS — M47896 Other spondylosis, lumbar region: Secondary | ICD-10-CM | POA: Diagnosis not present

## 2023-12-14 DIAGNOSIS — M5442 Lumbago with sciatica, left side: Secondary | ICD-10-CM | POA: Diagnosis not present

## 2023-12-14 DIAGNOSIS — M5416 Radiculopathy, lumbar region: Secondary | ICD-10-CM | POA: Diagnosis not present

## 2023-12-14 DIAGNOSIS — M51372 Other intervertebral disc degeneration, lumbosacral region with discogenic back pain and lower extremity pain: Secondary | ICD-10-CM | POA: Diagnosis not present

## 2023-12-14 DIAGNOSIS — G8929 Other chronic pain: Secondary | ICD-10-CM | POA: Diagnosis not present

## 2023-12-27 DIAGNOSIS — H401331 Pigmentary glaucoma, bilateral, mild stage: Secondary | ICD-10-CM | POA: Diagnosis not present

## 2023-12-27 DIAGNOSIS — Z961 Presence of intraocular lens: Secondary | ICD-10-CM | POA: Diagnosis not present

## 2023-12-28 DIAGNOSIS — Z23 Encounter for immunization: Secondary | ICD-10-CM | POA: Diagnosis not present

## 2024-01-04 DIAGNOSIS — M65331 Trigger finger, right middle finger: Secondary | ICD-10-CM | POA: Diagnosis not present

## 2024-01-04 DIAGNOSIS — R972 Elevated prostate specific antigen [PSA]: Secondary | ICD-10-CM | POA: Diagnosis not present

## 2024-01-04 DIAGNOSIS — M654 Radial styloid tenosynovitis [de Quervain]: Secondary | ICD-10-CM | POA: Diagnosis not present

## 2024-01-12 ENCOUNTER — Other Ambulatory Visit: Payer: Self-pay | Admitting: Urology

## 2024-01-12 DIAGNOSIS — R972 Elevated prostate specific antigen [PSA]: Secondary | ICD-10-CM

## 2024-01-16 ENCOUNTER — Telehealth: Payer: Self-pay

## 2024-01-16 NOTE — Telephone Encounter (Signed)
 Approval received effective 12/13/23-08/08/2098 for Hydroxyzine  25 mg tabs #270/90 day

## 2024-01-16 NOTE — Telephone Encounter (Signed)
 Prior authorization initiated and submitted to [facility] Medicare for Hydroxyzine  25 mg tabs #270/90 day

## 2024-01-30 ENCOUNTER — Ambulatory Visit
Admission: RE | Admit: 2024-01-30 | Discharge: 2024-01-30 | Disposition: A | Discharge status: Home / Self Care | Source: Ambulatory Visit | Attending: Urology

## 2024-01-30 DIAGNOSIS — R972 Elevated prostate specific antigen [PSA]: Secondary | ICD-10-CM

## 2024-01-30 DIAGNOSIS — N400 Enlarged prostate without lower urinary tract symptoms: Secondary | ICD-10-CM | POA: Diagnosis not present

## 2024-01-30 MED ORDER — GADOPICLENOL 0.5 MMOL/ML IV SOLN
10.0000 mL | Freq: Once | INTRAVENOUS | Status: AC | PRN
  Administered 2024-01-30: 10 mL via INTRAVENOUS

## 2024-02-06 DIAGNOSIS — R200 Anesthesia of skin: Secondary | ICD-10-CM | POA: Diagnosis not present

## 2024-02-06 DIAGNOSIS — M5416 Radiculopathy, lumbar region: Secondary | ICD-10-CM | POA: Diagnosis not present

## 2024-02-28 DIAGNOSIS — R55 Syncope and collapse: Secondary | ICD-10-CM | POA: Diagnosis not present

## 2024-02-28 DIAGNOSIS — Z87891 Personal history of nicotine dependence: Secondary | ICD-10-CM | POA: Diagnosis not present

## 2024-02-28 DIAGNOSIS — I959 Hypotension, unspecified: Secondary | ICD-10-CM | POA: Diagnosis not present

## 2024-02-28 DIAGNOSIS — R42 Dizziness and giddiness: Secondary | ICD-10-CM | POA: Diagnosis not present

## 2024-02-28 LAB — LAB REPORT - SCANNED: EGFR (Non-African Amer.): 77.71

## 2024-02-29 DIAGNOSIS — R748 Abnormal levels of other serum enzymes: Secondary | ICD-10-CM | POA: Diagnosis not present

## 2024-02-29 DIAGNOSIS — M5489 Other dorsalgia: Secondary | ICD-10-CM | POA: Diagnosis not present

## 2024-03-06 ENCOUNTER — Ambulatory Visit: Payer: Medicare Other | Admitting: Internal Medicine

## 2024-03-07 ENCOUNTER — Other Ambulatory Visit: Payer: Self-pay | Admitting: Behavioral Health

## 2024-03-07 DIAGNOSIS — F5105 Insomnia due to other mental disorder: Secondary | ICD-10-CM

## 2024-03-08 ENCOUNTER — Other Ambulatory Visit: Payer: Self-pay | Admitting: Behavioral Health

## 2024-03-08 DIAGNOSIS — R749 Abnormal serum enzyme level, unspecified: Secondary | ICD-10-CM | POA: Diagnosis not present

## 2024-03-08 DIAGNOSIS — R7989 Other specified abnormal findings of blood chemistry: Secondary | ICD-10-CM | POA: Diagnosis not present

## 2024-03-08 DIAGNOSIS — F5105 Insomnia due to other mental disorder: Secondary | ICD-10-CM

## 2024-03-08 DIAGNOSIS — M064 Inflammatory polyarthropathy: Secondary | ICD-10-CM | POA: Diagnosis not present

## 2024-03-08 DIAGNOSIS — F411 Generalized anxiety disorder: Secondary | ICD-10-CM

## 2024-03-08 DIAGNOSIS — G4752 REM sleep behavior disorder: Secondary | ICD-10-CM

## 2024-03-08 DIAGNOSIS — M353 Polymyalgia rheumatica: Secondary | ICD-10-CM | POA: Diagnosis not present

## 2024-03-08 NOTE — Progress Notes (Unsigned)
 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 RBD, Asbestos exposure, hxPE/ Eliquis 2019 , Urticaria, complicated by seasonal allergy, Glaucoma, GERD, bradycardia, BPH, Covid infection Dec 2022,  -Xanax , prednisone 5 mg daily, CPAP titration sleep study 08/26/22-> 10 cwp, sleep talking in REM (limited REM time) CPAP auto 10-20/ APS/ [facility]                          Has 2 machines Download- compliance  Body weight today- AirSense 11> 17%, AHI 1.5/hr He has seen a caregiver at [facility] who thought his sleep talking and motor activity, associated with vivid dreams, might reflect PTSD experience in childhood.  Medications from that visit have calmed some of his motor activity.  We discussed his recent sleep study which indicated current CPAP settings are appropriate.  The study did document talking during REM, but REM time was limited and no motor activity was seen. Wife wakes him if he starts punching/kicking.  He and I today again discussed  the potential association of activity during REM with some increased risk of future dementia/Parkinson's.  I advised that future evaluation probably should be through a neurologist. Breathing has been comfortable during the daytime with no acute respiratory events. CTaChest PE 05/18/22- IMPRESSION: 1. No evidence for acute pulmonary embolism. 2. Coronary artery calcifications.  03/09/24- 71 yoM former smoker, former Psychologist, occupational,  followed for OSA, ?? RBD, Asbestos exposure, hxPE/ Eliquis 2019 , Urticaria, complicated by seasonal allergy, Glaucoma, GERD, bradycardia, BPH, Covid  infection Dec 2022,  Anxiety/Behavioral Health,  -Xanax , prednisone 5 mg daily, CPAP titration sleep study 08/26/22-> 10 cwp, sleep talking in REM (limited REM time) CPAP auto 10-20/ APS/ [facility]   AirSense 11 Has 2 machines: one here and one at beach. Uses one or the other every night. 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 -----Wants to talk about O2 dropping   Discussed the use of AI scribe software for clinical note transcription with the patient, who gave verbal consent to proceed.  History of Present Illness     ROS-see HPI + = positive Constitutional:   No-   weight loss, night sweats, fevers, chills, fatigue, lassitude. HEENT:   +  headaches, +difficulty swallowing, tooth/dental problems, sore throat,       No-  sneezing, itching, ear ache, +nasal congestion, post nasal drip,  CV:  +  chest pain, orthopnea, PND, swelling in lower extremities, anasarca, dizziness, palpitations Resp: No-   shortness of breath with exertion or at rest.              No-   productive cough,  No non-productive cough,  No- coughing up of blood.              No-   change in color of mucus.  No- wheezing.   Skin: No-   rash or lesions. GI:  + heartburn,+ indigestion,  No-abdominal pain, nausea, vomiting,  GU: . MS:    +joint pain or swelling.  . Neuro-  nothing unusual Psych:  No- change in mood or affect. No depression, + anxiety.  No memory loss.  OBJ- Physical Exam   + = positive General- Alert, Oriented, Affect-appropriate, Distress- none acute, + overweight. +Small mandible. Skin- rash-none, lesions- none, excoriation- none Lymphadenopathy- none Head- atraumatic            Eyes- Gross vision intact, PERRLA, conjunctivae and secretions clear            Ears- Hearing, canals-normal             Nose- Clear, no-Septal dev, mucus, polyps, erosion, perforation             Throat- Mallampati II , mucosa clear , drainage- none, tonsils- atrophic.                        Own teeth, + retrognathia Neck- flexible , trachea midline, no stridor , thyroid  nl, carotid no bruit Chest - symmetrical excursion , unlabored           Heart/CV- RRR , no murmur , no gallop  , no rub, nl s1 s2                           - JVD- none , edema- none, stasis changes- none, varices- none           Lung- clear to P&A/ no crackles heard, wheeze- none, cough- none , dullness-none, rub- none, rales-none           Chest wall-  Abd-  Br/ Gen/ Rectal- Not done, not indicated Extrem- cyanosis- none, clubbing, none, atrophy- none, strength- nl Neuro- grossly intact to observation

## 2024-03-09 ENCOUNTER — Ambulatory Visit

## 2024-03-09 ENCOUNTER — Ambulatory Visit: Admitting: Behavioral Health

## 2024-03-09 ENCOUNTER — Encounter: Payer: Self-pay | Admitting: Behavioral Health

## 2024-03-09 ENCOUNTER — Encounter: Payer: Self-pay | Admitting: Internal Medicine

## 2024-03-09 ENCOUNTER — Ambulatory Visit (INDEPENDENT_AMBULATORY_CARE_PROVIDER_SITE_OTHER): Admitting: Internal Medicine

## 2024-03-09 ENCOUNTER — Telehealth: Payer: Self-pay | Admitting: Cardiology

## 2024-03-09 VITALS — BP 127/83 | HR 63 | Temp 97.4°F | Ht 67.0 in | Wt 201.4 lb

## 2024-03-09 DIAGNOSIS — Z87891 Personal history of nicotine dependence: Secondary | ICD-10-CM

## 2024-03-09 DIAGNOSIS — R0609 Other forms of dyspnea: Secondary | ICD-10-CM

## 2024-03-09 DIAGNOSIS — F99 Mental disorder, not otherwise specified: Secondary | ICD-10-CM | POA: Diagnosis not present

## 2024-03-09 DIAGNOSIS — F411 Generalized anxiety disorder: Secondary | ICD-10-CM | POA: Diagnosis not present

## 2024-03-09 DIAGNOSIS — G4733 Obstructive sleep apnea (adult) (pediatric): Secondary | ICD-10-CM | POA: Diagnosis not present

## 2024-03-09 DIAGNOSIS — G4752 REM sleep behavior disorder: Secondary | ICD-10-CM

## 2024-03-09 DIAGNOSIS — F5105 Insomnia due to other mental disorder: Secondary | ICD-10-CM | POA: Diagnosis not present

## 2024-03-09 MED ORDER — PRAZOSIN HCL 2 MG PO CAPS: ORAL_CAPSULE | ORAL | 1 refills | Status: DC

## 2024-03-09 MED ORDER — QUETIAPINE FUMARATE ER 50 MG PO TB24: 50.0000 mg | ORAL_TABLET | Freq: Every day | ORAL | 1 refills | Status: DC

## 2024-03-09 MED ORDER — HYDROXYZINE HCL 25 MG PO TABS: 25.0000 mg | ORAL_TABLET | Freq: Three times a day (TID) | ORAL | 1 refills | Status: DC | PRN

## 2024-03-09 MED ORDER — ESCITALOPRAM OXALATE 20 MG PO TABS: 20.0000 mg | ORAL_TABLET | Freq: Every day | ORAL | 1 refills | Status: DC

## 2024-03-09 MED ORDER — BUDESONIDE-FORMOTEROL FUMARATE 160-4.5 MCG/ACT IN AERO: INHALATION_SPRAY | RESPIRATORY_TRACT | 6 refills | Status: AC

## 2024-03-09 NOTE — Patient Instructions (Addendum)
 Order- CXR    dx Dyspnea on exertion  Order- Schedule pulmonary function test   dx Dyspnea on exertion  Script sent for inhaler Symbicort 160 maintenance inhaler. Inhale 2 puffs then rinse mouth, twice daily.  Try to rinse mouth after use.

## 2024-03-09 NOTE — Telephone Encounter (Signed)
 Spoke with pt who reports an episode of syncope last week due to orthostatic hypotension that sent him to the ED.  Pt has known orthostatic hypotension.  Pt reports he is hydrating with water and sports drinks, changing positions slowly.  He denies current CP, SOB or dizziness.  He is scheduled to see Glendia Ferrier, PA.C next week 8/5 for follow up.  Reviewed ED precautions.  Pt verbalizes understanding and agrees with current plan.

## 2024-03-09 NOTE — Progress Notes (Signed)
 [facility]  Patient ID: Anthony [name],  MRN: [id number]  PCP: Regino Slater, MD  Date of Evaluation: 03/09/2024 Time spent:30 minutes  Chief Complaint:  Chief Complaint   Anxiety; Follow-up; Patient Education; Medication Refill; Insomnia; Stress     HISTORY/CURRENT STATUS: HPI Anthony [name] 73 year old male presents to this office for follow up and medication management. Says that his medication are working well overall.  Says that his nightmares and vivid dreams have worsened a little since last visit but has a lot of stressors recently. His wife is currently in hospital in [place] due to sepsis from UTI.  He says that he is also concerned about nodules Urology has discovered on prostate. To decide on biopsy soon. For now is requesting no medication changes.   Says his depression level today is 2/10 and anxiety is 4/10. Says that he is sleeping 7-8  hours per night.  Patient says that he feels safe and has strong family support from his wife.  Denies any hx of mania, no psychosis, no current SI or HI.     Past psychiatric medications:  Individual Medical History/ Review of Systems: Changes? :No   Allergies: Levofloxacin , Suvorexant, Trazodone, Amoxicillin-pot clavulanate, Nsaids, and Tolmetin  Current Medications:  Current Outpatient Medications:    ALPRAZolam  (XANAX ) 1 MG tablet, Take 1 mg by mouth at bedtime as needed for anxiety. Take a 0.5 as needed, Disp: , Rfl:    aspirin EC 81 MG tablet, Take 81 mg by mouth daily., Disp: , Rfl:    budesonide-formoterol (SYMBICORT) 160-4.5 MCG/ACT inhaler, Inhale 2 puffs, then rinse mouth, twice daily- maintenance, Disp: 1 each, Rfl: 6   cetirizine (ZYRTEC) 10 MG tablet, Take 10 mg by mouth daily., Disp: , Rfl:    Clobetasol Propionate (TEMOVATE) 0.05 % external spray, Apply topically 2 (two) times daily., Disp: , Rfl:    EPINEPHrine 0.3 mg/0.3 mL IJ SOAJ injection, , Disp: , Rfl:    escitalopram  (LEXAPRO ) 20 MG tablet,  Take 1 tablet (20 mg total) by mouth daily., Disp: 90 tablet, Rfl: 1   ezetimibe  (ZETIA ) 10 MG tablet, Take 1 tablet by mouth once daily, Disp: 90 tablet, Rfl: 3   hydrOXYzine  (ATARAX ) 25 MG tablet, Take 1 tablet (25 mg total) by mouth 3 (three) times daily as needed., Disp: 270 tablet, Rfl: 1   latanoprost (XALATAN) 0.005 % ophthalmic solution, Place 1 drop into both eyes at bedtime. 1 drop OU HS (Patient not taking: Reported on 03/09/2024), Disp: , Rfl:    leflunomide (ARAVA) 10 MG tablet, Take 10 mg by mouth daily., Disp: , Rfl:    Multiple Vitamins-Minerals (PRESERVISION AREDS PO), Take 1 capsule by mouth daily., Disp: , Rfl:    omeprazole (PRILOSEC) 40 MG capsule, Take 40 mg by mouth daily., Disp: , Rfl:    prazosin  (MINIPRESS ) 2 MG capsule, Take 4 TABLETS BY MOUTH DAILY AT BEDTIME., Disp: 360 capsule, Rfl: 1   predniSONE (DELTASONE) 5 MG tablet, 5 mg daily. Takes 5 mg total; 1/2 in AM & 1/2 in PM, Disp: , Rfl:    QUEtiapine  (SEROQUEL  XR) 50 MG TB24 24 hr tablet, Take 1 tablet (50 mg total) by mouth at bedtime., Disp: 90 tablet, Rfl: 1   rosuvastatin  (CRESTOR ) 20 MG tablet, TAKE 1 TABLET BY MOUTH EVERY DAY, Disp: 90 tablet, Rfl: 2   tacrolimus (PROTOPIC) 0.1 % ointment, Apply topically as needed., Disp: , Rfl:    telmisartan  (MICARDIS ) 20 MG tablet, Take 1 tablet (20 mg total) by  mouth daily., Disp: 90 tablet, Rfl: 3   valACYclovir (VALTREX) 500 MG tablet, as needed., Disp: , Rfl:  Medication Side Effects: none  Family Medical/ Social History: Changes? No  MENTAL HEALTH EXAM:  There were no vitals taken for this visit.There is no height or weight on file to calculate BMI.  General Appearance: Casual  Eye Contact:  Good  Speech:  Clear and Coherent  Volume:  Normal  Mood:  Anxious  Affect:  Congruent and Anxious  Thought Process:  Coherent  Orientation:  Full (Time, Place, and Person)  Thought Content: Logical   Suicidal Thoughts:  No  Homicidal Thoughts:  No  Memory:  WNL   Judgement:  Good  Insight:  Good  Psychomotor Activity:  Normal  Concentration:  Concentration: Good  Recall:  Good  Fund of Knowledge: Good  Language: Good  Assets:  Desire for Improvement  ADL's:  Intact  Cognition: WNL  Prognosis:  Good    DIAGNOSES:    ICD-10-CM   1. Generalized anxiety disorder  F41.1 QUEtiapine  (SEROQUEL  XR) 50 MG TB24 24 hr tablet    escitalopram  (LEXAPRO ) 20 MG tablet    hydrOXYzine  (ATARAX ) 25 MG tablet    2. Insomnia due to other mental disorder  F51.05 QUEtiapine  (SEROQUEL  XR) 50 MG TB24 24 hr tablet   F99     3. REM sleep behavior disorder  G47.52 QUEtiapine  (SEROQUEL  XR) 50 MG TB24 24 hr tablet    4. Insomnia due to other mental disorder (CODE)  F51.05 prazosin  (MINIPRESS ) 2 MG capsule      Receiving Psychotherapy: No    RECOMMENDATIONS:  Greater than 50% of 30 min face to face time with patient was spent on counseling and coordination of care.  Nightmares and vivid dreams have increased recently. We reviewed his medication and also discussed possible triggers. He believes is situation and would like to keep medication same for now.  We agreed today: To continue hydroxyzine  25 mg three times daily as needed for agitation, or increased anxiety.  To continue Xanax  0.5 mg twice daily as needed for anxiety To continue Lexapro  20 mg daily To continue Seroquel  to 50 mg daily Continue Prazosin  6 mg at bedtime.  To follow up in 12 weeks to reassess To report worsening symptoms promptly Provided emergency contact information Discussed potential metabolic side effects associated with atypical antipsychotics, as well as potential risk for movement side effects. Advised pt to contact office if movement side effects occur.   Discussed potential benefits, risk, and side effects of benzodiazepines to include potential risk of tolerance and dependence, as well as possible drowsiness.  Advised patient not to drive if experiencing drowsiness and to take lowest  possible effective dose to minimize risk of dependence and tolerance.  Reviewed PDMP Redell DELENA Pizza, NP                 Redell DELENA Pizza, NP

## 2024-03-09 NOTE — Telephone Encounter (Signed)
 Pt c/o BP issue: STAT if pt c/o blurred vision, one-sided weakness or slurred speech.  STAT if BP is GREATER than 180/120 TODAY.  STAT if BP is LESS than 90/60 and SYMPTOMATIC TODAY  1. What is your BP concern?  Patient is concerned he has been passing out due to low BP   2. Have you taken any BP medication today?  Yes  3. What are your last 5 BP readings?    127/83 - today  4. Are you having any other symptoms (ex. Dizziness, headache, blurred vision, passed out)?  A little headache from dehydration   Patient stated he has blacked out but has not had blurred vision, one-sided weakness or slurred.  Patient noted his HR goes up into the 100's when he has an episode.  Patient has an appointment scheduled on 8/5 with S. Lelon.

## 2024-03-12 DIAGNOSIS — R3915 Urgency of urination: Secondary | ICD-10-CM | POA: Diagnosis not present

## 2024-03-12 DIAGNOSIS — R3914 Feeling of incomplete bladder emptying: Secondary | ICD-10-CM | POA: Diagnosis not present

## 2024-03-12 NOTE — Assessment & Plan Note (Signed)
 He had Telmisartan  and Amlodipine  stopped in the past due to low BPs. Telmisartan  was resumed b/c of high BPs. He recently called in with recurrent syncope.***

## 2024-03-12 NOTE — Progress Notes (Unsigned)
 OFFICE NOTE:    Date:  03/13/2024  ID:  KINSLER SOEDER, DOB 10/16/1950, MRN [id number] PCP: Regino Slater, MD  [facility] Providers Cardiologist:  Oneil Parchment, MD        Nonobstructive coronary artery disease  CCTA 10/05/2021: CAC score 191 (52nd percentile), dilated pulmonary artery, mild nonobstructive CAD (LAD proximal 25-49, D2 ostial 1-24, AV groove LCx distal 1-24), normal-sized aorta TTE 10/09/2021: EF 60-65, no RWMA, mild LVH, G1 DD, normal RVSF, trivial MR, RAP 3 Hypertension  Hyperlipidemia  Bradycardia Monitor 07/2019: NSR, occasional first-degree AV block, rare paroxysmal atrial tachycardia, min HR 45 during sleep, no pauses Orthostatic hypotension Hx of pulmonary embolism  Polymyalgia rheumatica  NAFLD  OSA Anxiety, nightmares  ?non-movement Parkinson's       Discussed the use of AI scribe software for clinical note transcription with the patient, who gave verbal consent to proceed. History of Present Illness Anthony [name] is a 73 y.o. male who returns for evaluation of syncope. He was last seen by Dr. Parchment in 05/2023. He called in last week to report an episode of syncope. Notes indicate he went to the ED.   Approximately a week and a half ago, he experienced a syncope episode where he blacked out completely. His wife had to hold him against the wall to prevent a fall. He reports that in previous episodes, he would feel dizzy and manage to control it by holding onto something or sitting down. He had been sitting for a while before standing up, leading to syncope. He usually manages these symptoms by being cautious when standing up, but this time it progressed beyond his usual control. Following the episode, he was taken to [facility], where he underwent a 12-lead EKG, chest x-ray, and blood work. He does not recall any abnormalities being reported from these tests. No recent illnesses such as vomiting, coughing, or diarrhea. No  chest discomfort.      Review of Systems  Constitutional: Negative for chills and fever.  Gastrointestinal:  Negative for diarrhea and vomiting.  -See HPI    Studies Reviewed:             Physical Exam:  VS:  BP (!) 145/80   Pulse 62   Ht 5' 7 (1.702 m)   Wt 198 lb 9.6 oz (90.1 kg)   SpO2 92%   BMI 31.11 kg/m     Orthostatic VS for the past 24 hrs (Last 3 readings):  BP- Lying Pulse- Lying BP- Sitting Pulse- Sitting BP- Standing at 0 minutes Pulse- Standing at 0 minutes BP- Standing at 3 minutes Pulse- Standing at 3 minutes  03/13/24 0944 145/80 62 134/74 66 123/75 71 158/75 71    Wt Readings from Last 3 Encounters:  03/13/24 198 lb 9.6 oz (90.1 kg)  03/09/24 201 lb 6.4 oz (91.4 kg)  05/13/23 191 lb 9.6 oz (86.9 kg)    Constitutional:      Appearance: Healthy appearance. Not in distress.  Neck:     Vascular: JVD normal.  Pulmonary:     Breath sounds: Normal breath sounds. No wheezing. No rales.  Cardiovascular:     Normal rate. Regular rhythm.     Murmurs: There is no murmur.  Edema:    Peripheral edema absent.  Abdominal:     Palpations: Abdomen is soft.       Assessment and Plan:    Assessment & Plan Orthostatic hypotension Primary hypertension He had Telmisartan  and Amlodipine  stopped  in the past due to low BPs. Telmisartan  was resumed b/c of high BPs. He had a recent episode of orthostatic hypotension resulting in syncope. This episode was like his prior episodes. This occurred with positional changes. He did go to the ED in [facility] I do not have records. It sounds like he had an EKG and some labs. He notes everything was normal. - Request records from the ED. - Given his orthostatic hypotension, will need to accept a higher lying/seated BP.  - Continue to push fluids (he already does a good job with this) - Start wearing knee high compression hose - Get up slowly, pump calf muscles before standing, raise head of bed 30 degrees. - Accept higher  blood pressures given orthostatic hypotension. - Continue telmisartan  20 mg daily. - Follow up 6 mos Coronary artery disease involving native coronary artery of native heart without angina pectoris Nonobstructive coronary artery disease on CCTA in 2023. No chest pain to suggest angina.  - Continue ASA 81 mg once daily, Zetia  10 mg once daily, Crestor  20 mg once daily. Asbestos exposure He is followed by pulmonology. He notes his breathing improved with Symbicort . He has PFTs pending.          Dispo:  Return in about 6 months (around 09/13/2024) for Routine Follow Up, w/ Dr. Jeffrie.  Signed, Glendia Ferrier, PA-C

## 2024-03-12 NOTE — Assessment & Plan Note (Signed)
 Nonobstructive coronary artery disease on CCTA in 2023. No chest pain to suggest angina.  - Continue ASA 81 mg once daily, Zetia  10 mg once daily, Crestor  20 mg once daily.

## 2024-03-13 ENCOUNTER — Encounter: Payer: Self-pay | Admitting: Physician Assistant

## 2024-03-13 ENCOUNTER — Ambulatory Visit: Attending: Physician Assistant | Admitting: Physician Assistant

## 2024-03-13 VITALS — BP 145/80 | HR 62 | Ht 67.0 in | Wt 198.6 lb

## 2024-03-13 DIAGNOSIS — I2510 Atherosclerotic heart disease of native coronary artery without angina pectoris: Secondary | ICD-10-CM | POA: Insufficient documentation

## 2024-03-13 DIAGNOSIS — I951 Orthostatic hypotension: Secondary | ICD-10-CM | POA: Diagnosis not present

## 2024-03-13 DIAGNOSIS — I10 Essential (primary) hypertension: Secondary | ICD-10-CM | POA: Diagnosis not present

## 2024-03-13 DIAGNOSIS — Z77090 Contact with and (suspected) exposure to asbestos: Secondary | ICD-10-CM | POA: Diagnosis not present

## 2024-03-13 MED ORDER — ELASTIC BANDAGES & SUPPORTS MISC: 1.0000 [IU] | Freq: Every day | 5 refills | Status: DC

## 2024-03-13 NOTE — Assessment & Plan Note (Signed)
 He is followed by pulmonology. He notes his breathing improved with Symbicort . He has PFTs pending.

## 2024-03-13 NOTE — Patient Instructions (Addendum)
 Medication Instructions:  Your physician recommends that you continue on your current medications as directed. Please refer to the Current Medication list given to you today.  *If you need a refill on your cardiac medications before your next appointment, please call your pharmacy*   Follow-Up: At [facility], you and your health needs are our priority.  As part of our continuing mission to provide you with exceptional heart care, our providers are all part of one team.  This team includes your primary Cardiologist (physician) and Advanced Practice Providers or APPs (Physician Assistants and Nurse Practitioners) who all work together to provide you with the care you need, when you need it.  Your next appointment:   6 month(s)  Provider:   Oneil Parchment, MD     Other Instructions Your provider recommends you wear compression stockings

## 2024-03-21 DIAGNOSIS — M545 Low back pain, unspecified: Secondary | ICD-10-CM | POA: Diagnosis not present

## 2024-03-22 DIAGNOSIS — N4289 Other specified disorders of prostate: Secondary | ICD-10-CM | POA: Diagnosis not present

## 2024-03-22 DIAGNOSIS — R972 Elevated prostate specific antigen [PSA]: Secondary | ICD-10-CM | POA: Diagnosis not present

## 2024-03-22 DIAGNOSIS — C61 Malignant neoplasm of prostate: Secondary | ICD-10-CM | POA: Diagnosis not present

## 2024-03-26 ENCOUNTER — Other Ambulatory Visit: Payer: Self-pay | Admitting: Cardiology

## 2024-03-26 ENCOUNTER — Ambulatory Visit: Payer: Self-pay | Admitting: Internal Medicine

## 2024-03-29 DIAGNOSIS — C61 Malignant neoplasm of prostate: Secondary | ICD-10-CM | POA: Diagnosis not present

## 2024-03-29 DIAGNOSIS — R3915 Urgency of urination: Secondary | ICD-10-CM | POA: Diagnosis not present

## 2024-04-03 ENCOUNTER — Other Ambulatory Visit (HOSPITAL_COMMUNITY): Payer: Self-pay | Admitting: Urology

## 2024-04-03 DIAGNOSIS — C61 Malignant neoplasm of prostate: Secondary | ICD-10-CM

## 2024-04-05 DIAGNOSIS — M51372 Other intervertebral disc degeneration, lumbosacral region with discogenic back pain and lower extremity pain: Secondary | ICD-10-CM | POA: Diagnosis not present

## 2024-04-05 DIAGNOSIS — M5442 Lumbago with sciatica, left side: Secondary | ICD-10-CM | POA: Diagnosis not present

## 2024-04-05 DIAGNOSIS — G8929 Other chronic pain: Secondary | ICD-10-CM | POA: Diagnosis not present

## 2024-04-05 DIAGNOSIS — M5416 Radiculopathy, lumbar region: Secondary | ICD-10-CM | POA: Diagnosis not present

## 2024-04-16 NOTE — Progress Notes (Incomplete)
 GU Location of Tumor / Histology: Prostate Ca  If Prostate Cancer, Gleason Score is (4 + 3) and PSA is (5.15 on 01/05/2024)  Anthony [name] presented as referral Dr. Sherwood Edison [facility]) elevated PSA.  Biopsies      04/20/2024 Dr. Sherwood Edison NM PET (PSMA) Skull to Mid Thigh CLINICAL DATA:     01/30/2024 DR. Sherwood Edison MR Prostate with/without Contrast CLINICAL DATA: Enlarged prostate gland. Elevated PSA. PSA equal 5.1    IMPRESSION: 1. No high-grade carcinoma identified in the peripheral zone. Linear striations suggest prior prostate inflammation PI-RADS (v2.1): 2 2. Minimally nodular transitional zone most consistent with benign prostate hypertrophy. PI-RADS(v2.1): 2 3. Prostate normal volume.    Past/Anticipated interventions by urology, if any:  Dr. Sherwood Edison   Past/Anticipated interventions by medical oncology, if any: NA  Weight changes, if any: {:18581}  IPSS: SHIM:  Bowel/Bladder complaints, if any: {:18581}   Nausea/Vomiting, if any: {:18581}  Pain issues, if any:  {:18581}  SAFETY ISSUES: Prior radiation? {:18581} Pacemaker/ICD? {:18581} Possible current pregnancy? Male Is the patient on methotrexate? No  Current Complaints / other details:     30 minutes spent total, including time for meaningful use questions, reviewing medication, as well as spent in face-to-face time in nurse evaluation with the patient.

## 2024-04-19 DIAGNOSIS — G4750 Parasomnia, unspecified: Secondary | ICD-10-CM | POA: Diagnosis not present

## 2024-04-19 DIAGNOSIS — G4733 Obstructive sleep apnea (adult) (pediatric): Secondary | ICD-10-CM | POA: Diagnosis not present

## 2024-04-20 ENCOUNTER — Encounter (HOSPITAL_COMMUNITY): Admission: RE | Admit: 2024-04-20 | Source: Ambulatory Visit

## 2024-04-23 ENCOUNTER — Encounter (HOSPITAL_COMMUNITY)
Admission: RE | Admit: 2024-04-23 | Discharge: 2024-04-23 | Disposition: A | Discharge status: Home / Self Care | Source: Ambulatory Visit | Attending: Urology | Admitting: Urology

## 2024-04-23 ENCOUNTER — Ambulatory Visit: Payer: Self-pay

## 2024-04-23 ENCOUNTER — Ambulatory Visit: Payer: Self-pay | Admitting: Physician Assistant

## 2024-04-23 ENCOUNTER — Ambulatory Visit: Payer: Self-pay | Admitting: Internal Medicine

## 2024-04-23 ENCOUNTER — Encounter: Payer: Self-pay | Admitting: Pulmonary Disease

## 2024-04-23 ENCOUNTER — Ambulatory Visit (INDEPENDENT_AMBULATORY_CARE_PROVIDER_SITE_OTHER): Admitting: Pulmonary Disease

## 2024-04-23 VITALS — BP 142/80 | HR 78 | Temp 98.0°F | Ht 67.0 in | Wt 202.0 lb

## 2024-04-23 DIAGNOSIS — Z87891 Personal history of nicotine dependence: Secondary | ICD-10-CM

## 2024-04-23 DIAGNOSIS — R0602 Shortness of breath: Secondary | ICD-10-CM | POA: Diagnosis not present

## 2024-04-23 DIAGNOSIS — R000 Tachycardia, unspecified: Secondary | ICD-10-CM | POA: Diagnosis not present

## 2024-04-23 DIAGNOSIS — R0902 Hypoxemia: Secondary | ICD-10-CM

## 2024-04-23 DIAGNOSIS — R079 Chest pain, unspecified: Secondary | ICD-10-CM

## 2024-04-23 DIAGNOSIS — C61 Malignant neoplasm of prostate: Secondary | ICD-10-CM | POA: Insufficient documentation

## 2024-04-23 MED ORDER — FLOTUFOLASTAT F 18 GALLIUM 296-5846 MBQ/ML IV SOLN
7.8000 | Freq: Once | INTRAVENOUS | Status: AC
  Administered 2024-04-23: 7.8 via INTRAVENOUS

## 2024-04-23 MED ORDER — PREDNISONE 20 MG PO TABS: ORAL_TABLET | ORAL | 0 refills | Status: AC

## 2024-04-23 NOTE — Patient Instructions (Addendum)
 We will get a CT scan with contrast to make sure he did not have blood clot in your lungs again  Take prednisone  as prescribed, 40 mg for 5 days and 20 mg for 5 days then stop, then resume your 5 mg daily that you normally take.  Please keep your previously scheduled follow-up with Dr. Neysa, I am happy to help out in the future after his retirement.

## 2024-04-23 NOTE — Telephone Encounter (Signed)
 FYI Only or Action Required?: FYI only for provider.  Patient was last seen in Pulmonary on 03/09/2024 with Dr. Neysa Fortis Nurse Triage reporting Shortness of Breath.  Symptoms began several months ago.  Interventions attempted: Prescription medications: Inhaler .  Symptoms are: gradually worsening.  Triage Disposition: See HCP Within 4 Hours (Or PCP Triage)  Patient/caregiver understands and will follow disposition?: Yes  **Appt. Scheduled for 9/15**        Copied from CRM #[id number]. Topic: Clinical - Red Word Triage >> Apr 23, 2024 11:18 AM Leila C wrote: Red Word that prompted transfer to Nurse Triage: Patient's spouse Romero [phone] states patient breathing is getting worsen, oxygen  saturation dropping into the 80s and shortness of breath and wants patient to be seen sooner than 06/12/24 at 8:30 am. Please advise. Reason for Disposition  [1] MILD difficulty breathing (e.g., minimal/no SOB at rest, SOB with walking, pulse < 100) AND [2] NEW-onset or WORSE than normal  Answer Assessment - Initial Assessment Questions 1. RESPIRATORY STATUS: Describe your breathing? (e.g., wheezing, shortness of breath, unable to speak, severe coughing)      SOB, wheezing, cough  2. ONSET: When did this breathing problem begin?      SOB began worsening x 3 months ago   3. PATTERN Does the difficult breathing come and go, or has it been constant since it started?       Intermittent   4. SEVERITY: How bad is your breathing? (e.g., mild, moderate, severe)      Mild currently, but becomes moderate at times   5. RECURRENT SYMPTOM: Have you had difficulty breathing before? If Yes, ask: When was the last time? and What happened that time?      Yes  6. CARDIAC HISTORY: Do you have any history of heart disease? (e.g., heart attack, angina, bypass surgery, angioplasty)      No   7. LUNG HISTORY: Do you have any history of lung disease?  (e.g., pulmonary embolus, asthma,  emphysema)     pulmonary embolus  8. CAUSE: What do you think is causing the breathing problem?   Unknown        9. OTHER SYMPTOMS: Do you have any other symptoms? (e.g., chest pain, cough, dizziness, fever, runny nose)     No   10. O2 SATURATION MONITOR:  Do you use an oxygen  saturation monitor (pulse oximeter) at home? If Yes, ask: What is your reading (oxygen  level) today? What is your usual oxygen  saturation reading? (e.g., 95%)        Pt. Reports high 80's O2 saturations yesterday. Patient  currently in office getting PET scan. Appt. Was made for 9/15, to follow up with provider to discuss ongoing symptoms of SOB, wheezing.  Wheezing at night mainly, at night  Protocols used: Breathing Difficulty-A-AH

## 2024-04-23 NOTE — Progress Notes (Signed)
 @Patient  ID: Anthony [name], male    DOB: March 16, 1951, 73 y.o.   MRN: [id number]  Chief Complaint  Patient presents with   Shortness of Breath    When walking. Gotten worse since the past 3 weeks.    Referring provider: Regino Slater, MD  HPI:   73 y.o. male who were seen for evaluation of acute to subacute onset of shortness of breath, hypoxemia, tachycardia as an acute visit.  Multiple prior pulmonary notes Dr. Neysa reviewed.  Patient with sleep apnea well-maintained on CPAP.  He had a history of unprovoked PE treated with anticoagulation some years ago.  After 6 months anticoagulation was stopped.  He said he had a workup with hematology etc. without clear cause.  Within the last 2 weeks been diagnosed with prostate cancer based on biopsy.  Has upcoming appointments with oncology, radiation oncology, surgeon.  Recently seen 03/09/2024 by pulmonologist with developing shortness of breath.  PFTs were ordered and he was started on Symbicort .  Symbicort  helped some.  PFTs not done.  But again over the last 2 to 3 weeks symptoms have worsened.  With tachycardia, hypoxemia to 85% with exertion.  Today in the room he does several squats up and down and his oxygen  saturation started at 9192% improved to 95% and then back down to 90% after exertion.  He had a PET scan today, the CT portion is reviewed that shows clear lungs with stable bibasilar atelectasis when compared to most recent cross-sectional imaging 05/2022.  He reports a negative CTA PE protocol 02/2024 in the setting [place] [facility].   Questionaires / Pulmonary Flowsheets:   ACT:      No data to display          MMRC:     No data to display          Epworth:      No data to display          Tests:   FENO:  No results found for: NITRICOXIDE  PFT:     No data to display          WALK:      No data to display          Imaging: Personally  reviewed and as per EMR in discussion this note No results found.  Lab Results: Personally reviewed CBC    Component Value Date/Time   WBC 6.4 05/18/2022 1151   RBC 4.43 05/18/2022 1151   HGB 13.9 05/18/2022 1151   HCT 41.8 05/18/2022 1151   PLT 200 05/18/2022 1151   MCV 94.4 05/18/2022 1151   MCH 31.4 05/18/2022 1151   MCHC 33.3 05/18/2022 1151   RDW 13.0 05/18/2022 1151   LYMPHSABS 2.5 05/18/2010 2231   MONOABS 0.5 05/18/2010 2231   EOSABS 0.2 05/18/2010 2231   BASOSABS 0.0 05/18/2010 2231    BMET    Component Value Date/Time   NA 137 05/18/2022 1151   NA 138 09/22/2021 1021   K 4.1 05/18/2022 1151   CL 105 05/18/2022 1151   CO2 25 05/18/2022 1151   GLUCOSE 109 (H) 05/18/2022 1151   BUN 19 05/18/2022 1151   BUN 13 09/22/2021 1021   CREATININE 0.97 05/18/2022 1151   CALCIUM  9.2 05/18/2022 1151   GFRNONAA 77.71 02/28/2024 1305   GFRAA 84 06/15/2019 1102    BNP No results found for: BNP  ProBNP No results found for: PROBNP  Specialty Problems  Pulmonary Problems   Obstructive sleep apnea   NPSG 10/01/12- AHI 16.2/ hr, weight 172 lbs, mild limb jerks        Allergies  Allergen Reactions   Bee Venom Anaphylaxis   Levofloxacin      Muscle tightness-told to never take it again  Other Reaction(s): Other (See Comments)  Muscle tightness-told to never take it again, , Muscle tightness-told to never take it again Muscle tightness-told to never take it again  Other Reaction(s): Myalgia   Suvorexant Other (See Comments)    Other reaction(s): tongue,lip, face swelling  Other Reaction(s): Other (See Comments)  Other reaction(s): tongue,lip, face swelling, , Other reaction(s): tongue,lip, face swelling Other reaction(s): tongue,lip, face swelling  Other Reaction(s): Other (See Comments)    Other reaction(s): tongue,lip, face swelling  Other reaction(s): tongue,lip, face swelling Other reaction(s): tongue,lip, face swelling   Trazodone Other (See  Comments)    Other reaction(s): prolonged erection  Other Reaction(s): Other (See Comments)  Other reaction(s): prolonged erection, , Other reaction(s): prolonged erection Other reaction(s): prolonged erection  Other Reaction(s): Other (See Comments)    Other reaction(s): prolonged erection  Other reaction(s): prolonged erection Other reaction(s): prolonged erection   Amoxicillin-Pot Clavulanate Rash and Dermatitis    Severe rash  Severe rash, , Severe rash Severe rash  Other Reaction(s): Unknown  Severe rash Severe rash   Nsaids Nausea Only    Gi upset   Tolmetin Nausea Only and Other (See Comments)    Gi upset  Other Reaction(s): GI Intolerance  Gi upset, , Gi upset Gi upset  Other Reaction(s): Other (See Comments)    Gi upset  Gi upset Gi upset    Immunization History  Administered Date(s) Administered   H1N1 09/03/2008   INFLUENZA, HIGH DOSE SEASONAL PF 06/08/2018, 05/01/2019   Influenza Split 05/10/2007, 05/14/2009, 05/06/2010, 05/28/2011, 04/26/2012, 05/15/2013, 05/08/2014, 05/28/2015, 04/08/2017   Influenza,inj,quad, With Preservative 05/28/2015, 04/09/2016   PFIZER(Purple Top)SARS-COV-2 Vaccination 09/14/2019, 10/09/2019, 11/07/2020, 05/09/2021   Pneumococcal Conjugate-13 06/25/2015   Pneumococcal Polysaccharide-23 07/06/2016   Tdap 08/10/1995, 09/12/2008   Zoster Recombinant(Shingrix) 07/13/2018, 09/13/2018   Zoster, Live 09/30/2010, 06/09/2012    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   Coronary artery disease involving native coronary artery of native heart without angina pectoris 09/22/2021   CTA of chest 02/2021 - coronary artery calcifications   Depression    GERD (gastroesophageal reflux disease)    Glaucoma    Headache(784.0)    Hypertension    Obstructive sleep apnea 12/20/2012   NPSG 10/01/12- AHI 16.2/  hr, weight 172 lbs, mild limb jerks CPAP auto> 9 cwp APS    Psoriasis    Pulmonary embolism (HCC) 08/22/2017   Multiple pulmonary emboli identified on CTa chest 08/15/17 at [facility]. Report in Media.  Eliquis  begun 08/2017   Seasonal allergies    Sinus bradycardia 09/22/2021   ZIO 2020 normal, Avg HR 60's. Chronotropic competence   Sleep apnea    using a cpap-moderate    Tobacco History: Social History   Tobacco Use  Smoking Status Former   Current packs/day: 0.00   Average packs/day: 1 pack/day for 5.0 years (5.0 ttl pk-yrs)   Types: Cigarettes   Start date: 08/09/1965   Quit date: 08/09/1970   Years since quitting: 53.7  Smokeless Tobacco Never   Counseling given: Not Answered   Continue to not smoke  Outpatient  Encounter Medications as of 04/23/2024  Medication Sig   ALPRAZolam  (XANAX ) 1 MG tablet Take 1 mg by mouth at bedtime as needed for anxiety. Take a 0.5 as needed   aspirin EC 81 MG tablet Take 81 mg by mouth daily.   budesonide -formoterol  (SYMBICORT ) 160-4.5 MCG/ACT inhaler Inhale 2 puffs, then rinse mouth, twice daily- maintenance   cetirizine (ZYRTEC) 10 MG tablet Take 10 mg by mouth daily.   Clobetasol Propionate (TEMOVATE) 0.05 % external spray Apply topically 2 (two) times daily.   EPINEPHrine 0.3 mg/0.3 mL IJ SOAJ injection    escitalopram  (LEXAPRO ) 20 MG tablet Take 1 tablet (20 mg total) by mouth daily.   ezetimibe  (ZETIA ) 10 MG tablet Take 1 tablet by mouth once daily   hydrOXYzine  (ATARAX ) 25 MG tablet Take 1 tablet (25 mg total) by mouth 3 (three) times daily as needed.   latanoprost (XALATAN) 0.005 % ophthalmic solution Place 1 drop into both eyes at bedtime. 1 drop OU HS   Multiple Vitamins-Minerals (PRESERVISION AREDS PO) Take 1 capsule by mouth daily.   omeprazole (PRILOSEC) 40 MG capsule Take 40 mg by mouth daily.   prazosin  (MINIPRESS ) 2 MG capsule Take 4 TABLETS BY MOUTH DAILY AT BEDTIME.   predniSONE  (DELTASONE ) 20 MG tablet Take 2 tablets (40 mg  total) by mouth daily with breakfast for 5 days, THEN 1 tablet (20 mg total) daily with breakfast for 5 days.   predniSONE  (DELTASONE ) 5 MG tablet 5 mg daily. Takes 5 mg total; 1/2 in AM & 1/2 in PM   QUEtiapine  (SEROQUEL  XR) 50 MG TB24 24 hr tablet Take 1 tablet (50 mg total) by mouth at bedtime.   rosuvastatin  (CRESTOR ) 20 MG tablet TAKE 1 TABLET BY MOUTH EVERY DAY   tacrolimus (PROTOPIC) 0.1 % ointment Apply topically as needed.   telmisartan  (MICARDIS ) 20 MG tablet Take 1 tablet (20 mg total) by mouth daily.   valACYclovir (VALTREX) 500 MG tablet as needed.   Elastic Bandages & Supports MISC 1 Units by Does not apply route daily. 20 mmHg bilateral knee high compression stockings (Patient not taking: Reported on 04/23/2024)   No facility-administered encounter medications on file as of 04/23/2024.     Review of Systems  Review of Systems  No orthopnea or PND.  Comprehensive review systems otherwise negative. Physical Exam  BP (!) 142/80 (BP Location: Left Arm, Patient Position: Sitting)   Pulse 78   Temp 98 F (36.7 C) (Oral)   Ht 5' 7 (1.702 m)   Wt 202 lb (91.6 kg)   SpO2 93%   BMI 31.64 kg/m   Wt Readings from Last 5 Encounters:  04/23/24 202 lb (91.6 kg)  03/13/24 198 lb 9.6 oz (90.1 kg)  03/09/24 201 lb 6.4 oz (91.4 kg)  05/13/23 191 lb 9.6 oz (86.9 kg)  03/08/23 192 lb 12.8 oz (87.5 kg)    BMI Readings from Last 5 Encounters:  04/23/24 31.64 kg/m  03/13/24 31.11 kg/m  03/09/24 31.54 kg/m  05/13/23 30.01 kg/m  03/08/23 30.20 kg/m     Physical Exam General: Sitting in chair, no acute distress Eyes: EOMI, no icterus Neck: Supple, JVP Pulmonary: Clear, normal work of breathing Cardiovascular: Warm, no edema, borderline tachycardic at rest Abdomen: None Neuro: Normal gait, no weakness Psych: Normal mood, full affect  Assessment & Plan:   Acute visit for shortness of breath, tachycardia, hypoxemia: With clear CT portion of chest on PET scan same day,  9/15.  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  No longer on anticoagulation.  Recent diagnosis of prostate cancer.  High suspicion for recurrence.  Differential diagnosis includes asthma but is never been diagnosed with this and hypoxemia is a bit out of the ordinary.  Pulmonary hypertension also possible but acute portion of last 2 to 3 weeks with a consolation of symptoms and risk factors pulmonary embolism must be ruled out.  Stat CTA PE protocol ordered, he has had symptoms for 2 to 3 weeks, we discussed ED visit but deferred.  Pulmonary hypertension possible as well.  Potential asthma: Prescribed Symbicort  03/2024 workup shortness of breath.  This helped some.  With subsequent worsening in the last 2 to 3 weeks.  Prednisone  taper in addition to investigation as above.   No follow-ups on file.   Donnice JONELLE Beals, MD 04/23/2024   This appointment required 41 minutes of patient care (this includes precharting, chart review, review of results, face-to-face care, etc.).

## 2024-04-23 NOTE — Telephone Encounter (Signed)
 Noted

## 2024-04-23 NOTE — Telephone Encounter (Signed)
 FYI Only or Action Required?: FYI only for provider.  Patient is followed in Pulmonology for OSA, asbestos exposure, last seen on 03/09/2024 by Neysa Reggy BIRCH, MD.  Called Nurse Triage reporting Shortness of Breath.  Symptoms began several weeks ago.  Interventions attempted: Maintenance inhaler.  Symptoms are: unchanged.  Triage Disposition: See HCP Within 4 Hours (Or PCP Triage)-appointment was made in another encounter for today.   Patient/caregiver understands and will follow disposition?: Yes  Reason for Disposition  [1] Longstanding difficulty breathing (e.g., CHF, COPD, emphysema) AND [2] WORSE than normal  Answer Assessment - Initial Assessment Questions 1. RESPIRATORY STATUS: Describe your breathing? (e.g., wheezing, shortness of breath, unable to speak, severe coughing)      Shortness of breath 2. ONSET: When did this breathing problem begin?      2-3 weeks ago 3. PATTERN Does the difficult breathing come and go, or has it been constant since it started?      constant 4. SEVERITY: How bad is your breathing? (e.g., mild, moderate, severe)      Mild-moderate 5. RECURRENT SYMPTOM: Have you had difficulty breathing before? If Yes, ask: When was the last time? and What happened that time?      yes 6. CARDIAC HISTORY: Do you have any history of heart disease? (e.g., heart attack, angina, bypass surgery, angioplasty)      yes 7. LUNG HISTORY: Do you have any history of lung disease?  (e.g., pulmonary embolus, asthma, emphysema)     yes 8. CAUSE: What do you think is causing the breathing problem?      unsure 9. OTHER SYMPTOMS: Do you have any other symptoms? (e.g., chest pain, cough, dizziness, fever, runny nose)     no 10. O2 SATURATION MONITOR:  Do you use an oxygen  saturation monitor (pulse oximeter) at home? If Yes, ask: What is your reading (oxygen  level) today? What is your usual oxygen  saturation reading? (e.g., 95%)       High pulse  oximeter 90% 12. TRAVEL: Have you traveled out of the country in the last month? (e.g., travel history, exposures)       no  Wife calling with concerns of increased shortness of breath for the last 2-3 weeks ago. Increased fatigue and shortness of breath. Wife calling to get patient on a wait list for a sooner appointment for PFTs and follow up appointment. Patient was at a nuclear med study and called at the same time by another nurse. Patient called wife to let her know he was able to be scheduled for an acute appointment today with another provider in office. Wife verbalized this information to this RN.  Protocols used: Breathing Difficulty-A-AH

## 2024-04-24 ENCOUNTER — Ambulatory Visit

## 2024-04-24 ENCOUNTER — Ambulatory Visit: Admission: RE | Admit: 2024-04-24 | Source: Ambulatory Visit | Admitting: Radiation Oncology

## 2024-04-24 ENCOUNTER — Telehealth: Payer: Self-pay | Admitting: Pulmonary Disease

## 2024-04-24 ENCOUNTER — Telehealth: Payer: Self-pay | Admitting: *Deleted

## 2024-04-24 ENCOUNTER — Ambulatory Visit
Admission: RE | Admit: 2024-04-24 | Discharge: 2024-04-24 | Disposition: A | Discharge status: Home / Self Care | Source: Ambulatory Visit | Attending: Pulmonary Disease | Admitting: Pulmonary Disease

## 2024-04-24 DIAGNOSIS — H401331 Pigmentary glaucoma, bilateral, mild stage: Secondary | ICD-10-CM | POA: Diagnosis not present

## 2024-04-24 DIAGNOSIS — Z961 Presence of intraocular lens: Secondary | ICD-10-CM | POA: Diagnosis not present

## 2024-04-24 DIAGNOSIS — R0603 Acute respiratory distress: Secondary | ICD-10-CM

## 2024-04-24 DIAGNOSIS — I2699 Other pulmonary embolism without acute cor pulmonale: Secondary | ICD-10-CM

## 2024-04-24 DIAGNOSIS — I2694 Multiple subsegmental pulmonary emboli without acute cor pulmonale: Secondary | ICD-10-CM | POA: Diagnosis not present

## 2024-04-24 DIAGNOSIS — R079 Chest pain, unspecified: Secondary | ICD-10-CM

## 2024-04-24 MED ORDER — APIXABAN (ELIQUIS) VTE STARTER PACK (10MG AND 5MG): ORAL_TABLET | ORAL | 0 refills | Status: DC

## 2024-04-24 MED ORDER — IOPAMIDOL (ISOVUE-370) INJECTION 76%
75.0000 mL | Freq: Once | INTRAVENOUS | Status: AC | PRN
  Administered 2024-04-24: 75 mL via INTRAVENOUS

## 2024-04-24 NOTE — Telephone Encounter (Signed)
 Addressed in separate encounter. Closing message.

## 2024-04-24 NOTE — Telephone Encounter (Signed)
 CTA reveals acute PE.  Suspect this is been present for 2 to 3 weeks given onset of symptoms 2 to 3 weeks ago.  He has been working, exerting himself without significant lightheadedness, presyncope or syncope.  RV appears a bit enlarged on the CT scan.  Given that this event likely occurred weeks ago, the acute phase is likely over.  Discussed presenting to the ED and admission versus treating with Eliquis  and obtaining outpatient echocardiogram.  After shared decision making patient elected for Eliquis  and outpatient echocardiogram.  Eliquis  starter pack 10 mg twice daily for 7 days then 5 mg twice daily thereafter sent.  Stat echocardiogram ordered.

## 2024-04-25 ENCOUNTER — Ambulatory Visit
Admission: RE | Admit: 2024-04-25 | Discharge: 2024-04-25 | Disposition: A | Discharge status: Home / Self Care | Source: Ambulatory Visit | Attending: Radiation Oncology | Admitting: Radiation Oncology

## 2024-04-25 ENCOUNTER — Encounter: Payer: Self-pay | Admitting: Radiation Oncology

## 2024-04-25 DIAGNOSIS — Z77090 Contact with and (suspected) exposure to asbestos: Secondary | ICD-10-CM | POA: Diagnosis not present

## 2024-04-25 DIAGNOSIS — Z191 Hormone sensitive malignancy status: Secondary | ICD-10-CM | POA: Diagnosis not present

## 2024-04-25 DIAGNOSIS — D1800 Hemangioma unspecified site: Secondary | ICD-10-CM | POA: Insufficient documentation

## 2024-04-25 DIAGNOSIS — L309 Dermatitis, unspecified: Secondary | ICD-10-CM | POA: Insufficient documentation

## 2024-04-25 DIAGNOSIS — C61 Malignant neoplasm of prostate: Secondary | ICD-10-CM | POA: Insufficient documentation

## 2024-04-25 DIAGNOSIS — I2510 Atherosclerotic heart disease of native coronary artery without angina pectoris: Secondary | ICD-10-CM | POA: Insufficient documentation

## 2024-04-25 DIAGNOSIS — Z86711 Personal history of pulmonary embolism: Secondary | ICD-10-CM | POA: Insufficient documentation

## 2024-04-25 DIAGNOSIS — J301 Allergic rhinitis due to pollen: Secondary | ICD-10-CM | POA: Insufficient documentation

## 2024-04-25 DIAGNOSIS — Z87891 Personal history of nicotine dependence: Secondary | ICD-10-CM | POA: Insufficient documentation

## 2024-04-25 DIAGNOSIS — G4730 Sleep apnea, unspecified: Secondary | ICD-10-CM | POA: Diagnosis not present

## 2024-04-25 DIAGNOSIS — D225 Melanocytic nevi of trunk: Secondary | ICD-10-CM | POA: Insufficient documentation

## 2024-04-25 DIAGNOSIS — L821 Other seborrheic keratosis: Secondary | ICD-10-CM | POA: Insufficient documentation

## 2024-04-25 DIAGNOSIS — K219 Gastro-esophageal reflux disease without esophagitis: Secondary | ICD-10-CM | POA: Insufficient documentation

## 2024-04-25 DIAGNOSIS — M353 Polymyalgia rheumatica: Secondary | ICD-10-CM | POA: Insufficient documentation

## 2024-04-25 DIAGNOSIS — Z7952 Long term (current) use of systemic steroids: Secondary | ICD-10-CM | POA: Insufficient documentation

## 2024-04-25 DIAGNOSIS — I2699 Other pulmonary embolism without acute cor pulmonale: Secondary | ICD-10-CM | POA: Insufficient documentation

## 2024-04-25 DIAGNOSIS — Z79899 Other long term (current) drug therapy: Secondary | ICD-10-CM | POA: Insufficient documentation

## 2024-04-25 DIAGNOSIS — M159 Polyosteoarthritis, unspecified: Secondary | ICD-10-CM | POA: Insufficient documentation

## 2024-04-25 DIAGNOSIS — M064 Inflammatory polyarthropathy: Secondary | ICD-10-CM | POA: Insufficient documentation

## 2024-04-25 DIAGNOSIS — Z7951 Long term (current) use of inhaled steroids: Secondary | ICD-10-CM | POA: Insufficient documentation

## 2024-04-25 DIAGNOSIS — M47816 Spondylosis without myelopathy or radiculopathy, lumbar region: Secondary | ICD-10-CM | POA: Insufficient documentation

## 2024-04-25 DIAGNOSIS — L814 Other melanin hyperpigmentation: Secondary | ICD-10-CM | POA: Insufficient documentation

## 2024-04-25 DIAGNOSIS — Z9049 Acquired absence of other specified parts of digestive tract: Secondary | ICD-10-CM | POA: Diagnosis not present

## 2024-04-25 DIAGNOSIS — Z7982 Long term (current) use of aspirin: Secondary | ICD-10-CM | POA: Diagnosis not present

## 2024-04-25 DIAGNOSIS — F419 Anxiety disorder, unspecified: Secondary | ICD-10-CM | POA: Insufficient documentation

## 2024-04-25 DIAGNOSIS — E7800 Pure hypercholesterolemia, unspecified: Secondary | ICD-10-CM | POA: Insufficient documentation

## 2024-04-25 DIAGNOSIS — F32A Depression, unspecified: Secondary | ICD-10-CM | POA: Insufficient documentation

## 2024-04-25 DIAGNOSIS — Z808 Family history of malignant neoplasm of other organs or systems: Secondary | ICD-10-CM | POA: Insufficient documentation

## 2024-04-25 DIAGNOSIS — Z7901 Long term (current) use of anticoagulants: Secondary | ICD-10-CM | POA: Insufficient documentation

## 2024-04-25 DIAGNOSIS — L400 Psoriasis vulgaris: Secondary | ICD-10-CM | POA: Insufficient documentation

## 2024-04-25 DIAGNOSIS — I499 Cardiac arrhythmia, unspecified: Secondary | ICD-10-CM | POA: Insufficient documentation

## 2024-04-25 DIAGNOSIS — G4700 Insomnia, unspecified: Secondary | ICD-10-CM | POA: Insufficient documentation

## 2024-04-25 DIAGNOSIS — R7303 Prediabetes: Secondary | ICD-10-CM | POA: Insufficient documentation

## 2024-04-25 HISTORY — DX: Elevated prostate specific antigen (PSA): R97.20

## 2024-04-25 NOTE — Progress Notes (Signed)
 Radiation Oncology         [phone] ________________________________  Initial Outpatient Consultation  Name: Anthony [name] MRN: [id number]  Date: 04/25/2024  DOB: 04/20/1951  RR:Xnpmjoj, Dibas, MD  Carolee Sherwood JONETTA DOUGLAS, MD   REFERRING PHYSICIAN: Carolee Sherwood JONETTA DOUGLAS, MD  DIAGNOSIS: 73 y.o. gentleman with Stage T1c adenocarcinoma of the prostate with Gleason score of 4+3, and PSA of 5.15.    ICD-10-CM   1. Malignant neoplasm of prostate (HCC)  C61       HISTORY OF PRESENT ILLNESS: Anthony [name] is a 73 y.o. male with a diagnosis of prostate cancer. He has a longstanding history of BPH with LUTS since approximately 2018, initially followed by Dr. Cherylene send and PSA was normal at that time.  He has been followed by Dr. Carolee since approximately 2020 and his PSA was 3.84 in February 2023.  He has failed multiple medications and PTNS treatments and started having an increase in his LUTS in February 2025 so he presented back to Dr. Carolee at that time for further evaluation.  Digital rectal examination performed at that time showed no nodules or concerning findings.  He was started on Gemtesa and did notice improvement in his LUTS.  PSA had increased to 4.71 in March 2025 and further increased to 5.15 in May 2025.  A prostate MRI was performed on 01/30/2024 and did not show any high-grade lesions.  The patient proceeded to transrectal ultrasound with 12 biopsies of the prostate on 03/22/2024.  The prostate volume measured 36 cc.  Out of 12 core biopsies, 5 were positive.  The maximum Gleason score was 4+3, and this was seen in the right mid and right base lateral.  Additionally, Gleason 3+4 was seen in the left base and Gleason 3+3 in the left base lateral and right mid lateral.  A PSMA PET scan was performed for disease staging on 04/23/2024 and was without any evidence of metastatic disease.  The patient reviewed the biopsy and imaging results with his urologist and he has kindly been  referred today for discussion of potential radiation treatment options.  He is also scheduled for a consult visit with Dr. Renda on 05/08/2024 to discuss his surgical options.  Of note, he has a remote history of coronary artery disease and pulmonary embolus and recently began having increasing shortness of breath and fatigue over the past 2 to 3 weeks.  He was evaluated with his pulmonologist, Dr. Annella on 04/23/2024 and a CT Angio Chest was performed on 04/24/2024 showing multiple segmental and subsegmental PEs, likely ongoing for 2 to 3 weeks given the timing of his symptoms.  He has been started on Eliquis  and will have further evaluation with echocardiogram in the near future.  PREVIOUS RADIATION THERAPY: No  PAST MEDICAL HISTORY:  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   Coronary artery disease involving native coronary artery of native heart without angina pectoris 09/22/2021   CTA of chest 02/2021 - coronary artery calcifications   Depression    GERD (gastroesophageal reflux disease)    Glaucoma    Headache(784.0)    Hypertension    Obstructive sleep apnea 12/20/2012   NPSG 10/01/12- AHI 16.2/ hr, weight 172 lbs, mild limb jerks CPAP auto> 9 cwp APS    Psoriasis    Pulmonary embolism (HCC) 08/22/2017   Multiple pulmonary  emboli identified on CTa chest 08/15/17 at [facility]. Report in Media.  Eliquis  begun 08/2017   Seasonal allergies    Sinus bradycardia 09/22/2021   ZIO 2020 normal, Avg HR 60's. Chronotropic competence   Sleep apnea    using a cpap-moderate      PAST SURGICAL HISTORY: Past Surgical History:  Procedure Laterality Date   APPENDECTOMY     CARPAL TUNNEL WITH CUBITAL TUNNEL  2008   right   CARPAL TUNNEL WITH CUBITAL TUNNEL  2009   and ulnar nerve-left   ESOPHAGEAL MANOMETRY N/A 10/25/2018   Procedure: ESOPHAGEAL  MANOMETRY (EM);  Surgeon: Dianna Specking, MD;  Location: [facility];  Service: Endoscopy;  Laterality: N/A;   EXTERNAL EAR SURGERY     ears reset surgically age 26   NAILBED REPAIR Right 05/08/2021   Procedure: RIGHT LONG FINGER NAIL UNIT BIOPSY;  Surgeon: Murrell Drivers, MD;  Location: [facility];  Service: Orthopedics;  Laterality: Right;   PH IMPEDANCE STUDY N/A 10/25/2018   Procedure: PH IMPEDANCE STUDY;  Surgeon: Dianna Specking, MD;  Location: [facility];  Service: Endoscopy;  Laterality: N/A;   RETINAL DETACHMENT SURGERY  2004   right   RETINAL DETACHMENT SURGERY  2002   left   SHOULDER ARTHROSCOPY  2006   left   TONSILLECTOMY     TRIGGER FINGER RELEASE Left 02/15/2013   Procedure: RELEASE TRIGGER FINGER/A-1 PULLEY LEFT LONG FINGER;  Surgeon: Lamar LULLA Leonor Mickey., MD;  Location: [facility];  Service: Orthopedics;  Laterality: Left;   TRIGGER FINGER RELEASE Left 09/06/2013   Procedure: RELEASE TRIGGER FINGER/A-1 PULLEY;  Surgeon: Lamar LULLA Leonor Mickey., MD;  Location: [facility];  Service: Orthopedics;  Laterality: Left;   TRIGGER FINGER RELEASE Right 08/05/2014   Procedure: RIGHT SMALL TRIGGER RELEASE ;  Surgeon: Drivers Murrell, MD;  Location: [facility];  Service: Orthopedics;  Laterality: Right;   ULNAR NERVE TRANSPOSITION Left 09/06/2013   Procedure: LEFT ANTERIOR SUBCUTANEOUS TRANSPOSITION;  Surgeon: Lamar LULLA Leonor Mickey., MD;  Location: [facility];  Service: Orthopedics;  Laterality: Left;    FAMILY HISTORY:  Family History  Problem Relation Age of Onset   Polymyalgia rheumatica Sister     SOCIAL HISTORY:  Social History   Socioeconomic History   Marital status: Married    Spouse name: Romero   Number of children: 0   Years of education: 12   Highest education level: Some college, no degree  Occupational History   Not on file  Tobacco Use   Smoking status: Former    Current packs/day: 0.00     Average packs/day: 1 pack/day for 5.0 years (5.0 ttl pk-yrs)    Types: Cigarettes    Start date: 08/09/1965    Quit date: 08/09/1970    Years since quitting: 53.7   Smokeless tobacco: Never  Vaping Use   Vaping status: Never Used  Substance and Sexual Activity   Alcohol use: Yes    Alcohol/week: 7.0 standard drinks of alcohol    Types: 7 Cans of beer per week    Comment: social   Drug use: Never   Sexual activity: Not on file  Other Topics Concern   Not on file  Social History Narrative   Are you right handed or left handed? Right handed    Are you currently employed ? no   What is your current occupation? Retired    Do you live at home alone? no   Who  lives with you? Wife    What type of home do you live in: 1 story or 2 story? 2 story stays on one level        Social Drivers of Corporate investment banker Strain: Not on file  Food Insecurity: Low Risk  (06/22/2023)   Received from [facility]   Hunger Vital Sign    Within the past 12 months, you worried that your food would run out before you got money to buy more: Never true    Within the past 12 months, the food you bought just didn't last and you didn't have money to get more. : Never true  Transportation Needs: No Transportation Needs (06/22/2023)   Received from [facility]    In the past 12 months, has lack of reliable transportation kept you from medical appointments, meetings, work or from getting things needed for daily living? : No  Physical Activity: Not on file  Stress: Not on file  Social Connections: Not on file  Intimate Partner Violence: Not on file    ALLERGIES: Bee venom, Levofloxacin , Suvorexant, Trazodone, Amoxicillin-pot clavulanate, Nsaids, and Tolmetin  MEDICATIONS:  Current Outpatient Medications  Medication Sig Dispense Refill   ALPRAZolam  (XANAX ) 1 MG tablet Take 1 mg by mouth at bedtime as needed for anxiety. Take a 0.5 as needed     APIXABAN  (ELIQUIS ) VTE STARTER PACK  (10MG  AND 5MG ) Take as directed on package: start with two-5mg  tablets twice daily for 7 days. On day 8, switch to one-5mg  tablet twice daily. 74 each 0   aspirin EC 81 MG tablet Take 81 mg by mouth daily.     budesonide -formoterol  (SYMBICORT ) 160-4.5 MCG/ACT inhaler Inhale 2 puffs, then rinse mouth, twice daily- maintenance 1 each 6   cetirizine (ZYRTEC) 10 MG tablet Take 10 mg by mouth daily.     Clobetasol Propionate (TEMOVATE) 0.05 % external spray Apply topically 2 (two) times daily.     EPINEPHrine 0.3 mg/0.3 mL IJ SOAJ injection      escitalopram  (LEXAPRO ) 20 MG tablet Take 1 tablet (20 mg total) by mouth daily. 90 tablet 1   ezetimibe  (ZETIA ) 10 MG tablet Take 1 tablet by mouth once daily 90 tablet 2   hydrOXYzine  (ATARAX ) 25 MG tablet Take 1 tablet (25 mg total) by mouth 3 (three) times daily as needed. 270 tablet 1   latanoprost (XALATAN) 0.005 % ophthalmic solution Place 1 drop into both eyes at bedtime. 1 drop OU HS     Multiple Vitamins-Minerals (PRESERVISION AREDS PO) Take 1 capsule by mouth daily.     omeprazole (PRILOSEC) 40 MG capsule Take 40 mg by mouth daily.     prazosin  (MINIPRESS ) 2 MG capsule Take 4 TABLETS BY MOUTH DAILY AT BEDTIME. 360 capsule 1   predniSONE  (DELTASONE ) 20 MG tablet Take 2 tablets (40 mg total) by mouth daily with breakfast for 5 days, THEN 1 tablet (20 mg total) daily with breakfast for 5 days. 15 tablet 0   predniSONE  (DELTASONE ) 5 MG tablet 5 mg daily. Takes 5 mg total; 1/2 in AM & 1/2 in PM     QUEtiapine  (SEROQUEL  XR) 50 MG TB24 24 hr tablet Take 1 tablet (50 mg total) by mouth at bedtime. 90 tablet 1   rosuvastatin  (CRESTOR ) 20 MG tablet TAKE 1 TABLET BY MOUTH EVERY DAY 90 tablet 2   tacrolimus (PROTOPIC) 0.1 % ointment Apply topically as needed.     telmisartan  (MICARDIS ) 20 MG tablet Take  1 tablet (20 mg total) by mouth daily. 90 tablet 3   valACYclovir (VALTREX) 500 MG tablet as needed.     No current facility-administered medications for this  visit.    REVIEW OF SYSTEMS:  On review of systems, the patient reports that he is doing well overall. He denies any chest pain, shortness of breath, cough, fevers, chills, night sweats, unintended weight changes. He denies any bowel disturbances, and denies abdominal pain, nausea or vomiting. He denies any new musculoskeletal or joint aches or pains. His IPSS was 14, indicating moderate urinary symptoms with urgency as his main complaint although he does have some hesitancy and weak stream . His SHIM was 23, indicating he does not have erectile dysfunction. A complete review of systems is obtained and is otherwise negative.    PHYSICAL EXAM:  Wt Readings from Last 3 Encounters:  04/23/24 202 lb (91.6 kg)  03/13/24 198 lb 9.6 oz (90.1 kg)  03/09/24 201 lb 6.4 oz (91.4 kg)   Temp Readings from Last 3 Encounters:  04/23/24 98 F (36.7 C) (Oral)  03/09/24 (!) 97.4 F (36.3 C) (Temporal)  05/18/22 97.6 F (36.4 C) (Oral)   BP Readings from Last 3 Encounters:  04/23/24 (!) 142/80  03/13/24 (!) 145/80  03/09/24 127/83   Pulse Readings from Last 3 Encounters:  04/23/24 78  03/13/24 62  03/09/24 63    /10  In general this is a well appearing Caucasian male in no acute distress. He's alert and oriented x4 and appropriate throughout the examination. Cardiopulmonary assessment is negative for acute distress, and he exhibits normal effort.     KPS = 90 (PE and CAD)  100 - Normal; no complaints; no evidence of disease. 90   - Able to carry on normal activity; minor signs or symptoms of disease. 80   - Normal activity with effort; some signs or symptoms of disease. 34   - Cares for self; unable to carry on normal activity or to do active work. 60   - Requires occasional assistance, but is able to care for most of his personal needs. 50   - Requires considerable assistance and frequent medical care. 40   - Disabled; requires special care and assistance. 30   - Severely disabled; hospital  admission is indicated although death not imminent. 20   - Very sick; hospital admission necessary; active supportive treatment necessary. 10   - Moribund; fatal processes progressing rapidly. 0     - Dead  Karnofsky DA, Abelmann WH, Craver LS and Burchenal [facility] [phone]) The use of the nitrogen mustards in the palliative treatment of carcinoma: with particular reference to bronchogenic carcinoma Cancer 1 634-56  LABORATORY DATA:  Lab Results  Component Value Date   WBC 6.4 05/18/2022   HGB 13.9 05/18/2022   HCT 41.8 05/18/2022   MCV 94.4 05/18/2022   PLT 200 05/18/2022   Lab Results  Component Value Date   NA 137 05/18/2022   K 4.1 05/18/2022   CL 105 05/18/2022   CO2 25 05/18/2022   Lab Results  Component Value Date   ALT 38 12/11/2021     RADIOGRAPHY: CT Angio Chest W/Cm &/Or Wo Cm Result Date: 04/24/2024 CLINICAL DATA:  Tachycardia, hypoxemia. History of pulmonary embolus. EXAM: CT ANGIOGRAPHY CHEST WITH CONTRAST TECHNIQUE: Multidetector CT imaging of the chest was performed using the standard protocol during bolus administration of intravenous contrast. Multiplanar CT image reconstructions and MIPs were obtained to evaluate the vascular anatomy. RADIATION DOSE REDUCTION:  This exam was performed according to the departmental dose-optimization program which includes automated exposure control, adjustment of the mA and/or kV according to patient size and/or use of iterative reconstruction technique. CONTRAST:  75mL ISOVUE -370 IOPAMIDOL  (ISOVUE -370) INJECTION 76% COMPARISON:  05/18/2022 FINDINGS: Cardiovascular: Multiple filling defects are noted within pulmonary arteries in the right upper lobe and right middle lobe compatible with pulmonary emboli. Also noted in bilateral lower lobe pulmonary arterial subsegmental branches. Evidence of right heart strain with RV/LV ratio of 1.43. Heart is normal size. Mediastinum/Nodes: No mediastinal, hilar, or axillary adenopathy. Trachea and esophagus  are unremarkable. Thyroid  unremarkable. Lungs/Pleura: No confluent airspace opacities or effusions. Dependent ground-glass opacities could reflect atelectasis or scarring, stable since prior study. No effusions. Upper Abdomen: No acute findings Musculoskeletal: Chest wall soft tissues are unremarkable. No acute bony abnormality. Review of the MIP images confirms the above findings. IMPRESSION: Multiple segmental and subsegmental pulmonary emboli noted bilaterally, right greater than left. CT evidence of right heart strain (RV/LV Ratio = 1.43) consistent with at least submassive (intermediate risk) PE. The presence of right heart strain has been associated with an increased risk of morbidity and mortality. Please refer to the Code PE Focused order set in EPIC. Critical Value/emergent results were called by telephone at the time of interpretation on 04/24/2024 at 4:18 pm to provider [facility] , who verbally acknowledged these results. Electronically Signed   By: Franky Crease M.D.   On: 04/24/2024 16:18   NM PET (PSMA) SKULL TO MID THIGH Result Date: 04/23/2024 CLINICAL DATA:  Prostate carcinoma with biochemical recurrence. EXAM: NUCLEAR MEDICINE PET SKULL BASE TO THIGH TECHNIQUE: 7.8 mCi Flotufolastat (Posluma ) was injected intravenously. Full-ring PET imaging was performed from the skull base to thigh after the radiotracer. CT data was obtained and used for attenuation correction and anatomic localization. COMPARISON:  None Available. FINDINGS: NECK No radiotracer activity in neck lymph nodes. Incidental CT finding: None. CHEST No radiotracer accumulation within mediastinal or hilar lymph nodes. No suspicious pulmonary nodules on the CT scan. Incidental CT finding: None. ABDOMEN/PELVIS Prostate: Moderate to high intensity radiotracer activity within LEFT and RIGHT lateral mid gland peripheral zones consistent with prostate adenocarcinoma. (image 187 for example) Lymph nodes: No radiotracer avid lymph  nodes in the pelvis. No radiotracer avid periaortic lymph nodes. Liver: No evidence of liver metastasis. Incidental CT finding: None. SKELETON No focal activity to suggest skeletal metastasis. IMPRESSION: 1. Moderate to high intensity radiotracer activity within the LEFT and RIGHT lateral mid gland peripheral zones consistent with prostate adenocarcinoma. 2. No evidence of metastatic adenopathy in the pelvis or periaortic retroperitoneum. 3. No evidence of visceral metastasis or skeletal metastasis. Electronically Signed   By: Jackquline Boxer M.D.   On: 04/23/2024 16:27      IMPRESSION/PLAN: 1. 73 y.o. gentleman with Stage T1c adenocarcinoma of the prostate with Gleason Score of 4+3, and PSA of 5.15. We discussed the patient's workup and outlined the nature of prostate cancer in this setting. The patient's T stage, Gleason's score, and PSA put him into the unfavorable intermediate risk group. Accordingly, he is eligible for a variety of potential treatment options including brachytherapy, 5.5 weeks of external radiation or prostatectomy. We discussed the available radiation techniques, and focused on the details and logistics of delivery. We discussed and outlined the risks, benefits, short and long-term effects associated with radiotherapy and compared and contrasted these with prostatectomy. We discussed the role of SpaceOAR gel in reducing the rectal toxicity associated with radiotherapy. {We also detailed the  role of ST-ADT in the treatment of unfavorable intermediate risk prostate cancer and outlined the associated side effects that could be expected with this therapy.  Given his recent history of PE and known coronary artery disease, we feel that the cardiac risks associated with ADT likely outweigh the small potential benefit of this therapy.  He was encouraged to ask questions that were answered to his stated satisfaction.  At the conclusion of our conversation, the patient is interested in moving  forward with 5.5 weeks of external beam therapy. He was recently diagnosed with multiple subacute PEs and will have an echocardiogram tomorrow for further evaluation. He plans to inquire with his cardiologist whether he can safely come off the Eliquis  for 5 days prior to the placement of fiducial markers and SpaceOAR gel placement. We did discuss that the procedure is not mandatory and that we could safely proceed with treatment without the markers and SpaceOAR gel if coming off blood thinners will put him at high risk for significant events with CVA/AMI.  We will share our discussion with Dr. Carolee and once we have clearance from his pulmonologist and cardiologist, we will proceed with coordinating for fiducial markers and SpaceOAR gel placement, first available, prior to simulation, to reduce rectal toxicity from radiotherapy. The patient appears to have a good understanding of his disease and our treatment recommendations which are of curative intent and is in agreement with the stated plan.  Therefore, we will move forward with treatment planning accordingly, in anticipation of beginning IMRT in the near future. We enjoyed meeting him and his wife today and look forward to continuing to participate in his care.  We personally spent 70 minutes in this encounter including chart review, reviewing radiological studies, meeting face-to-face with the patient, entering orders and completing documentation.    Sabra MICAEL Rusk, PA-C    [name], MD  [facility]  Radiation Oncology Direct Dial: [phone]  Fax: [phone] [email or web address]  Skype  [email or web address]

## 2024-04-25 NOTE — Progress Notes (Signed)
 GU Location of Tumor / Histology: Prostate Ca  If Prostate Cancer, Gleason Score is (4 + 3) and PSA is (5.15 on 01/05/2024)  Anthony [name] presented as referral Dr. Sherwood Edison [facility]) elevated PSA.  Biopsies      04/20/2024 Dr. Sherwood Edison NM PET (PSMA) Skull to Mid Thigh CLINICAL DATA:  Prostate carcinoma with biochemical recurrence.    IMPRESSION: 1. Moderate to high intensity radiotracer activity within the LEFT and RIGHT lateral mid gland peripheral zones consistent with prostate adenocarcinoma. 2. No evidence of metastatic adenopathy in the pelvis or periaortic retroperitoneum. 3. No evidence of visceral metastasis or skeletal metastasis.    01/30/2024 DR. Sherwood Edison MR Prostate with/without Contrast CLINICAL DATA: Enlarged prostate gland. Elevated PSA. PSA equal 5.1    IMPRESSION: 1. No high-grade carcinoma identified in the peripheral zone. Linear striations suggest prior prostate inflammation PI-RADS (v2.1): 2 2. Minimally nodular transitional zone most consistent with benign prostate hypertrophy. PI-RADS(v2.1): 2 3. Prostate normal volume.    Past/Anticipated interventions by urology, if any:  Dr. Sherwood Edison   Past/Anticipated interventions by medical oncology, if any: NA  Weight changes, if any: No  IPSS:  14 SHIM:  23  Bowel/Bladder complaints, if any: Urinary frequency urgency.  No bowel issues.  Nausea/Vomiting, if any: No  Pain issues, if any:  0/10  SAFETY ISSUES: Prior radiation?  No Pacemaker/ICD? No Possible current pregnancy? Male Is the patient on methotrexate? No  Current Complaints / other details:  None  30 minutes spent total, including time for meaningful use questions, reviewing medication, as well as spent in face-to-face time in nurse evaluation with the patient.

## 2024-04-25 NOTE — Telephone Encounter (Signed)
Echo has been scheduled  

## 2024-04-26 ENCOUNTER — Ambulatory Visit: Payer: Self-pay | Admitting: Pulmonary Disease

## 2024-04-26 ENCOUNTER — Ambulatory Visit (HOSPITAL_COMMUNITY)
Admission: RE | Admit: 2024-04-26 | Discharge: 2024-04-26 | Disposition: A | Discharge status: Home / Self Care | Source: Ambulatory Visit | Attending: Internal Medicine | Admitting: Internal Medicine

## 2024-04-26 DIAGNOSIS — R0603 Acute respiratory distress: Secondary | ICD-10-CM | POA: Diagnosis not present

## 2024-04-26 DIAGNOSIS — I2699 Other pulmonary embolism without acute cor pulmonale: Secondary | ICD-10-CM | POA: Insufficient documentation

## 2024-04-26 DIAGNOSIS — I2609 Other pulmonary embolism with acute cor pulmonale: Secondary | ICD-10-CM | POA: Diagnosis not present

## 2024-04-26 LAB — ECHOCARDIOGRAM COMPLETE
Area-P 1/2: 2.39 cm2
S' Lateral: 3.2 cm

## 2024-04-27 NOTE — Progress Notes (Signed)
 RN spoke with patient's wife.  Reported that patient had echocardiogram and was WNL.  Inquiring about next steps, and who will manage Eliquis .  RN reached out to patient's pulmonologist to review recommendations for management and recommendations if benefits outweigh risk for coming off anti-coagulation for fiducial marker's and spaceOAR gel placement. Will continue to follow.

## 2024-04-30 ENCOUNTER — Telehealth: Payer: Self-pay

## 2024-04-30 NOTE — Telephone Encounter (Signed)
 Copied from CRM [id number]. Topic: Medical Record Request - Provider/Facility Request >> Apr 26, 2024  2:34 PM Devaughn RAMAN wrote: Reason for CRM: Orlean with [facility] called regarding a cardiac and pulmonary clearance for the patient to hold Eliquis  for 5 days, contacted [facility] spoke with [place]. Unable to speak with clinical staff, please follow up with Orlean at the contact information below.  Orlean Direct Phone-[phone]  ATC Shirley back and there was no answer. Left vm to call back.

## 2024-04-30 NOTE — Telephone Encounter (Signed)
 Anthony [name] called back and stated pt needs pulmonary clearance for Traditional marking with gel spacer procedure and it has not been scheduled yet.  As Anthony [name] was saying the procedure name the phone hung up so I believe this is what she said the procedure was called.   Routing to Surgical Clearance.

## 2024-05-07 NOTE — Progress Notes (Unsigned)
 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 RBD, Asbestos exposure, hxPE/ Eliquis  2019 , Urticaria, complicated by seasonal allergy, Glaucoma, GERD, bradycardia, BPH, Covid infection Dec 2022,  Anxiety/Behavioral Health,  -Xanax , prednisone  5 mg daily, CPAP titration sleep study 08/26/22-> 10 cwp, sleep talking in REM (limited REM time) CPAP auto 10-20/ APS/ [employer]   AirSense 11 Has 2 machines: one here and one at beach. Uses one or the other every night. 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 -----Wants to talk about O2 dropping    05/08/24- 72 yoM former smoker, former Psychologist, occupational,  followed for OSA, ?? RBD, Prostate Cancer,  Asbestos exposure, hxPE/ Eliquis  2019 , Urticaria, complicated by seasonal allergy, Glaucoma, GERD, bradycardia, BPH, Covid infection Dec 2022,  Anxiety/Behavioral Health,  -Xanax , prednisone  5 mg daily, CPAP titration sleep study 08/26/22-> 10 cwp, sleep talking in REM (limited REM time) CPAP auto 10-20/ APS/ [employer]   AirSense 11 Has 2 machines: one here and one at beach. Uses one or the other every night. Download- compliance - (AirSense 11 machine) 60%, AHI 1.9/hr Body weight today-203 lbs     04/23/24- LOV Dr Hunsucker with concern chest pain/ SOB, high probability recurrent PE> CT> Eliquis  40m BID Newly dx'd prostate cancer. ??Needs clearance to stop Eliquis  for  procedure. -----Discuss CTA from 04/24/2024 and sleep apnea issues.  Scheduled for PFT on 05/23/2024. Discussed the use of AI scribe software for clinical note transcription with the patient, who gave verbal consent to proceed.  History of Present Illness   Anthony [name] is a 73 year old male with obstructive sleep apnea and recurrent pulmonary embolism who presents for follow up.  He  has been on Eliquis  since 9/15 dx of PE.  He experienced previous episodes of pulmonary embolism after epidural steroid injections in November and December 2018,  He is scheduled for prostate cancer treatment with radioactive seeds on June 01, 2024, requiring cessation of Eliquis  two days prior. His prostate cancer is currently contained. Risk/ benefit consideration of tempororary Eliquis  hold was discussed, recognizing trade-offs and no way to guarantee zero risk. He will have been on Eliquis  6 weeks by that time  He discontinued Symbicort  after starting Eliquis  and has also stopped prednisone . He uses a CPAP machine for sleep apnea, which is stable with no issues. Both of his machines are functioning well. He benefits from CPAP.     Assessment and Plan:    Recurrent pulmonary embolism Recurrent pulmonary embolism managed with Eliquis . Lifelong anticoagulation required. Temporary discontinuation needed for prostate cancer treatment. Discussed potential Lovenox bridging. - Coordinate follow-up with Doctor Delayne for anticoagulation management. - Approve temporary discontinuation of Eliquis  for two days prior to prostate cancer treatment. - Consider referral to hematologist for further evaluation of clotting disorder if deemed necessary by Doctor Hunsaker.  Obstructive sleep apnea Obstructive sleep apnea well-managed with CPAP therapy. - Continue current CPAP therapy without changes.  Dyspnea Dyspnea likely related to recurrent pulmonary embolism. Pulmonary function test may not be necessary due to  recent embolism diagnosis. - Cancel pulmonary function test scheduled for October 15. - Reassess need for pulmonary function test after follow-up with Doctor Delayne.          CTaPE- 04/24/24 IMPRESSION: Multiple segmental and subsegmental pulmonary emboli noted bilaterally, right greater than left. CT evidence of right heart strain (RV/LV Ratio = 1.43) consistent with at least submassive (intermediate risk) PE. The presence of right heart strain has been associated with an increased risk of morbidity and mortality. Please refer to the Code PE Focused order set in EPIC.     ROS-see HPI + = positive Constitutional:   No-   weight loss, night sweats, fevers, chills, fatigue, lassitude. HEENT:   +  headaches, +difficulty swallowing, tooth/dental problems, sore throat,       No-  sneezing, itching, ear ache, +nasal congestion, post nasal drip,  CV:  +  chest pain, orthopnea, PND, swelling in lower extremities, anasarca, dizziness, palpitations Resp: No-   shortness of breath with exertion or at rest.              No-   productive cough,  No non-productive cough,  No- coughing up of blood.              No-   change in color of mucus.  No- wheezing.   Skin: No-   rash or lesions. GI:  + heartburn,+ indigestion,  No-abdominal pain, nausea, vomiting,  GU: . MS:    +joint pain or swelling.  . Neuro-     nothing unusual Psych:  No- change in mood or affect. No depression, + anxiety.  No memory loss.  OBJ- Physical Exam   + = positive General- Alert, Oriented, Affect-appropriate, Distress- none acute, + overweight. +Small mandible. Skin- rash-none, lesions- none, excoriation- none Lymphadenopathy- none Head- atraumatic            Eyes- Gross vision intact, PERRLA, conjunctivae and secretions clear            Ears- Hearing, canals-normal            Nose- Clear, no-Septal dev, mucus, polyps, erosion, perforation             Throat- Mallampati II , mucosa clear , drainage- none,  tonsils- atrophic.                        Own teeth, + retrognathia Neck- flexible , trachea midline, no stridor , thyroid  nl, carotid no bruit Chest - symmetrical excursion , unlabored           Heart/CV- RRR , no murmur , no gallop  , no rub, nl s1 s2                           - JVD- none , edema- none, stasis changes- none, varices- none           Lung- clear to P&A/ no crackles heard, wheeze- none, cough- none , dullness-none, rub- none, rales-none           Chest wall-  Abd-  Br/ Gen/ Rectal- Not done, not indicated Extrem- cyanosis- none, clubbing, none, atrophy- none, strength- nl Neuro- grossly intact to observation

## 2024-05-07 NOTE — Telephone Encounter (Signed)
 Reviewed chart  Pt was seen 04/23/24 by Dr Annella, had CTA 04/24/14- pos for PE  Need to speak with Orlean to find out exactly what procedure they need clearance for  [facility] and there was no answer- LMTCB

## 2024-05-07 NOTE — Telephone Encounter (Signed)
 Copied from CRM [id number]. Topic: Medical Record Request - Provider/Facility Request >> May 07, 2024 11:45 AM Elspeth A wrote: Orlean from [facility] returning Omaya Nieland's call.  She disconnected while waiting to see if Sonny was available.   Called [place] and no answer- LMTCB

## 2024-05-08 ENCOUNTER — Other Ambulatory Visit: Payer: Self-pay | Admitting: Urology

## 2024-05-08 ENCOUNTER — Encounter: Payer: Self-pay | Admitting: Internal Medicine

## 2024-05-08 ENCOUNTER — Ambulatory Visit (INDEPENDENT_AMBULATORY_CARE_PROVIDER_SITE_OTHER): Admitting: Internal Medicine

## 2024-05-08 DIAGNOSIS — G4733 Obstructive sleep apnea (adult) (pediatric): Secondary | ICD-10-CM | POA: Diagnosis not present

## 2024-05-08 DIAGNOSIS — C61 Malignant neoplasm of prostate: Secondary | ICD-10-CM

## 2024-05-08 DIAGNOSIS — R0609 Other forms of dyspnea: Secondary | ICD-10-CM | POA: Diagnosis not present

## 2024-05-08 DIAGNOSIS — R7989 Other specified abnormal findings of blood chemistry: Secondary | ICD-10-CM | POA: Diagnosis not present

## 2024-05-08 DIAGNOSIS — I2699 Other pulmonary embolism without acute cor pulmonale: Secondary | ICD-10-CM | POA: Diagnosis not present

## 2024-05-08 DIAGNOSIS — Z87891 Personal history of nicotine dependence: Secondary | ICD-10-CM

## 2024-05-08 NOTE — Patient Instructions (Addendum)
 Order- schedule follow up fr Dr Annella in 1 month  Ok to stop Eliquis  for 2 week for radioactive sleep as planned  You can decide with Dr Annella when to seek Hematology evaluation for recurrent pulmonary blood clots.   Cancel PFT for now. Can schedule in the future if needed.

## 2024-05-10 NOTE — Telephone Encounter (Signed)
 Copied from CRM [id number]. Topic: Clinical - Medical Advice >> May 07, 2024  1:59 PM Rozanna MATSU wrote: Reason for CRM: Orlean with [facility] returning Lesile's call she stated they are busy today also and just call back [phone]. Its about a clearance for the patient Thanks   Florence Orlean again and there was no answer- LMTCB.

## 2024-05-10 NOTE — Progress Notes (Signed)
 RN spoke with patient's wife.  Education provided for next steps with CT Simulation and MRI.  All questions answered.  No additional needs at this time.

## 2024-05-16 ENCOUNTER — Telehealth: Payer: Self-pay | Admitting: *Deleted

## 2024-05-16 ENCOUNTER — Telehealth: Payer: Self-pay | Admitting: Internal Medicine

## 2024-05-16 NOTE — Telephone Encounter (Signed)
 Fax received from Dr. Sherwood Edison with [facility] to perform a fidicial markers/space OAR on patient.  Patient needs surgery clearance. Surgery is 06/01/24. Patient was seen on 05/08/24. Office protocol is a risk assessment can be sent to surgeon if patient has been seen in 60 days or less.   Sending to Dr Neysa for risk assessment or recommendations if patient needs to be seen in office prior to surgical procedure.   They are specifically asking for permission to hold Eliquis  2 days prior to procedure. Please advise, thanks!

## 2024-05-16 NOTE — Telephone Encounter (Signed)
 Copied from CRM [id number]. Topic: Clinical - Medication Question >> May 11, 2024  9:42 AM Rozanna MATSU wrote: Reason for CRM: [facility] with [facility] about clearance for pt, needs to know the instructions for his Eliquis  also.  Message sent to Dr. Neysa:  Winona Sonny HERO, CMA routed this conversation to Neysa Reggy BIRCH, MD Winona Sonny HERO, [facility]    05/16/24  1:46 PM Note Fax received from Dr. Sherwood Edison with [facility] to perform a fidicial markers/space OAR on patient.  Patient needs surgery clearance. Surgery is 06/01/24. Patient was seen on 05/08/24. Office protocol is a risk assessment can be sent to surgeon if patient has been seen in 60 days or less.    Sending to Dr Neysa for risk assessment or recommendations if patient needs to be seen in office prior to surgical procedure.    They are specifically asking for permission to hold Eliquis  2 days prior to procedure. Please advise, thanks!      Awaiting response from Dr. Neysa.

## 2024-05-16 NOTE — Telephone Encounter (Signed)
 See telephone encounter 05/16/24

## 2024-05-17 DIAGNOSIS — M5416 Radiculopathy, lumbar region: Secondary | ICD-10-CM | POA: Diagnosis not present

## 2024-05-17 DIAGNOSIS — M47816 Spondylosis without myelopathy or radiculopathy, lumbar region: Secondary | ICD-10-CM | POA: Diagnosis not present

## 2024-05-18 NOTE — Telephone Encounter (Signed)
 My office note from Sept 30, Assessment and Plan, specifically states ok to hold Eliquis  for 2 days for Urologic procedure.  I discussed this with Dr Annella.

## 2024-05-21 NOTE — Telephone Encounter (Signed)
 I have faxed Dr. Saundra note from 05/08/24 to [facility].

## 2024-05-23 ENCOUNTER — Encounter: Payer: Self-pay | Admitting: Pulmonary Disease

## 2024-05-23 ENCOUNTER — Ambulatory Visit: Admitting: Pulmonary Disease

## 2024-05-23 ENCOUNTER — Encounter (HOSPITAL_COMMUNITY): Payer: Self-pay | Admitting: Urology

## 2024-05-23 ENCOUNTER — Encounter

## 2024-05-23 VITALS — BP 163/80 | HR 69 | Ht 67.0 in | Wt 208.0 lb

## 2024-05-23 DIAGNOSIS — I2699 Other pulmonary embolism without acute cor pulmonale: Secondary | ICD-10-CM | POA: Diagnosis not present

## 2024-05-23 DIAGNOSIS — Z7901 Long term (current) use of anticoagulants: Secondary | ICD-10-CM | POA: Diagnosis not present

## 2024-05-23 DIAGNOSIS — Z86711 Personal history of pulmonary embolism: Secondary | ICD-10-CM

## 2024-05-23 DIAGNOSIS — R0902 Hypoxemia: Secondary | ICD-10-CM | POA: Diagnosis not present

## 2024-05-23 DIAGNOSIS — R399 Unspecified symptoms and signs involving the genitourinary system: Secondary | ICD-10-CM | POA: Diagnosis not present

## 2024-05-23 MED ORDER — APIXABAN 5 MG PO TABS: 5.0000 mg | ORAL_TABLET | Freq: Two times a day (BID) | ORAL | 11 refills | Status: AC

## 2024-05-23 NOTE — Progress Notes (Signed)
 Surgery clearance dated 05/08/24 by Dr. Reggy Salt in Epic. Eliquis  instructions to hold 2 days prior are included in note. Patient verbalized understanding of these instructions to hold Eliquis .   Spoke w/ via phone for pre-op interview---Jeffery and wife Romero Lab needs dos----  [employer] results------ COVID test -----patient states asymptomatic no test needed Arrive at -------0830 NPO after MN NO Solid Food.  Clear liquids from MN until---0730 Pre-Surgery Ensure or G2:  Med rec completed Medications to take morning of surgery -----Lexapro  and Prilosec Diabetic medication -----  GLP1 agonist last dose: GLP1 instructions:  Patient instructed no nail polish to be worn day of surgery Patient instructed to bring photo id and insurance card day of surgery Patient aware to have Driver (ride ) / caregiver    for 24 hours after surgery - Wife Romero Boone Patient Special Instructions -----Fleet enema per surgeons instructions. Pre-Op special Instructions -----  Patient verbalized understanding of instructions that were given at this phone interview. Patient denies chest pain, sob, fever, cough at the interview.

## 2024-05-23 NOTE — Progress Notes (Signed)
 @Patient  ID: Anthony [name], male    DOB: Dec 19, 1950, 73 y.o.   MRN: [id number]  Chief Complaint  Patient presents with   Medical Management of Chronic Issues    Pt states refill  Eliquis       Referring provider: Regino Slater, MD  HPI:   73 y.o. male with history of OSA whom we are seeing for evaluation of acute PE, recurrent, and possible underlying asthma.  Most recent pulmonary note reviewed.  Multiple pulmonary office notes reviewed.  Returns for follow-up scheduled by Dr. Neysa.  Note reviewed.  Adherence to Eliquis .  1 month now.  Breathing seems to be okay.  Did not use Symbicort  very much.  Upcoming prostate seed placement.  He has orthostatic hypotension.  He reports his oxygen  drops at home sometimes.  To the high 80s.  We have not documented any of that here in clinic.  His oxygen  saturations of all been in the mid 90s.  HPI initial visit: Patient with sleep apnea well-maintained on CPAP.  He had a history of unprovoked PE treated with anticoagulation some years ago.  After 6 months anticoagulation was stopped.  He said he had a workup with hematology etc. without clear cause.  Within the last 2 weeks been diagnosed with prostate cancer based on biopsy.  Has upcoming appointments with oncology, radiation oncology, surgeon.  Recently seen 03/09/2024 by pulmonologist with developing shortness of breath.  PFTs were ordered and he was started on Symbicort .  Symbicort  helped some.  PFTs not done.  But again over the last 2 to 3 weeks symptoms have worsened.  With tachycardia, hypoxemia to 85% with exertion.  Today in the room he does several squats up and down and his oxygen  saturation started at 9192% improved to 95% and then back down to 90% after exertion.  He had a PET scan today, the CT portion is reviewed that shows clear lungs with stable bibasilar atelectasis when compared to most recent cross-sectional imaging 05/2022.  He reports a negative CTA PE protocol 02/2024 in  the setting of presyncope or syncope at the [facility] [place] [facility].   Questionaires / Pulmonary Flowsheets:   ACT:      No data to display          MMRC:     No data to display          Epworth:      No data to display          Tests:   FENO:  No results found for: NITRICOXIDE  PFT:     No data to display          WALK:     05/23/2024    3:24 PM  SIX MIN WALK  Tech Comments: Patient was able to walk all three laps w/o the need for O2    Imaging: Personally reviewed and as per EMR in discussion this note ECHOCARDIOGRAM COMPLETE Result Date: 04/26/2024    ECHOCARDIOGRAM REPORT   Patient Name:   Anthony [name] Date of Exam: 04/26/2024 Medical Rec #:  [id number]            Height:       67.0 in Accession #:    [id number]           Weight:       202.0 lb Date of Birth:  1950-08-17           BSA:          2.031  m Patient Age:    72 years             BP:           145/80 mmHg Patient Gender: M                    HR:           60 bpm. Exam Location:  [address] Procedure: 2D Echo, Cardiac Doppler, Color Doppler, 3D Echo and Strain Analysis            (Both Spectral and Color Flow Doppler were utilized during            procedure). Indications:    I26.09 Pulmonary embolism  History:        Patient has prior history of Echocardiogram examinations, most                 recent 10/09/2021. CAD, Pulmonary embolism,                 Arrythmias:Bradycardia, Signs/Symptoms:Shortness of Breath; Risk                 Factors:Hypertension and Dyslipidemia.  Sonographer:    Elsie Bohr RDCS Referring Phys: [phone] Sanya Kobrin R Penina Reisner IMPRESSIONS  1. Left ventricular ejection fraction, by estimation, is 60 to 65%. Left ventricular ejection fraction by 3D volume is 60 %. The left ventricle has normal function. The left ventricle has no regional wall motion abnormalities. There is mild left ventricular hypertrophy. Left ventricular diastolic parameters are consistent with  Grade I diastolic dysfunction (impaired relaxation). The average left ventricular global longitudinal strain is -16.1 %.  2. Right ventricular systolic function is normal. The right ventricular size is normal. There is normal pulmonary artery systolic pressure. The estimated right ventricular systolic pressure is 26.8 mmHg.  3. Left atrial size was mildly dilated.  4. The mitral valve is degenerative. Trivial mitral valve regurgitation.  5. The aortic valve is tricuspid. Aortic valve regurgitation is not visualized.  6. The inferior vena cava is normal in size with greater than 50% respiratory variability, suggesting right atrial pressure of 3 mmHg. Comparison(s): Changes from prior study are noted. 10/09/2021: LVEF 60-65%. FINDINGS  Left Ventricle: Left ventricular ejection fraction, by estimation, is 60 to 65%. Left ventricular ejection fraction by 3D volume is 60 %. The left ventricle has normal function. The left ventricle has no regional wall motion abnormalities. The average left ventricular global longitudinal strain is -16.1 %. The left ventricular internal cavity size was normal in size. There is mild left ventricular hypertrophy. Left ventricular diastolic parameters are consistent with Grade I diastolic dysfunction (impaired relaxation). Indeterminate filling pressures. Right Ventricle: The right ventricular size is normal. No increase in right ventricular wall thickness. Right ventricular systolic function is normal. There is normal pulmonary artery systolic pressure. The tricuspid regurgitant velocity is 2.44 m/s, and  with an assumed right atrial pressure of 3 mmHg, the estimated right ventricular systolic pressure is 26.8 mmHg. Left Atrium: Left atrial size was mildly dilated. Right Atrium: Right atrial size was normal in size. Pericardium: There is no evidence of pericardial effusion. Mitral Valve: The mitral valve is degenerative in appearance. Mild to moderate mitral annular calcification. Trivial  mitral valve regurgitation. Tricuspid Valve: The tricuspid valve is grossly normal. Tricuspid valve regurgitation is trivial. Aortic Valve: The aortic valve is tricuspid. Aortic valve regurgitation is not visualized. Pulmonic Valve: The pulmonic valve was normal in structure. Pulmonic valve regurgitation is not  visualized. Aorta: The aortic root and ascending aorta are structurally normal, with no evidence of dilitation. Venous: The inferior vena cava is normal in size with greater than 50% respiratory variability, suggesting right atrial pressure of 3 mmHg. IAS/Shunts: No atrial level shunt detected by color flow Doppler. Additional Comments: 3D was performed not requiring image post processing on an independent workstation and was normal.  LEFT VENTRICLE PLAX 2D LVIDd:         4.50 cm         Diastology LVIDs:         3.20 cm         LV e' medial:    5.87 cm/s LV PW:         1.10 cm         LV E/e' medial:  12.2 LV IVS:        1.30 cm         LV e' lateral:   8.92 cm/s LVOT diam:     2.00 cm         LV E/e' lateral: 8.0 LV SV:         67 LV SV Index:   33              2D Longitudinal LVOT Area:     3.14 cm        Strain LV IVRT:       108 msec        2D Strain GLS   -13.3 %                                (A4C):                                2D Strain GLS   -17.3 %                                (A3C):                                2D Strain GLS   -17.5 %                                (A2C):                                2D Strain GLS   -16.1 %                                Avg:                                 3D Volume EF                                LV 3D EF:    Left  ventricul                                             ar                                             ejection                                             fraction                                             by 3D                                             volume is                                              60 %.                                 3D Volume EF:                                3D EF:        60 %                                LV EDV:       167 ml                                LV ESV:       67 ml                                LV SV:        99 ml RIGHT VENTRICLE             IVC RV S prime:     13.90 cm/s  IVC diam: 1.30 cm TAPSE (M-mode): 1.7 cm RVSP:           26.8 mmHg   PULMONARY VEINS                             Diastolic Velocity: 34.80 cm/s                             S/D Velocity:       1.40  Systolic Velocity:  48.40 cm/s LEFT ATRIUM             Index        RIGHT ATRIUM           Index LA diam:        4.00 cm 1.97 cm/m   RA Pressure: 3.00 mmHg LA Vol (A2C):   55.3 ml 27.23 ml/m  RA Area:     12.60 cm LA Vol (A4C):   79.2 ml 39.00 ml/m  RA Volume:   29.80 ml  14.67 ml/m LA Biplane Vol: 69.6 ml 34.27 ml/m  AORTIC VALVE LVOT Vmax:   110.00 cm/s LVOT Vmean:  71.500 cm/s LVOT VTI:    0.214 m  AORTA Ao Root diam: 3.60 cm Ao Asc diam:  3.40 cm MITRAL VALVE                TRICUSPID VALVE MV Area (PHT): 2.39 cm     TR Peak grad:   23.8 mmHg MV Decel Time: 317 msec     TR Vmax:        244.00 cm/s MV E velocity: 71.80 cm/s   Estimated RAP:  3.00 mmHg MV A velocity: 114.00 cm/s  RVSP:           26.8 mmHg MV E/A ratio:  0.63                             SHUNTS                             Systemic VTI:  0.21 m                             Systemic Diam: 2.00 cm Vinie Maxcy MD Electronically signed by Vinie Maxcy MD Signature Date/Time: 04/26/2024/3:47:06 PM    Final    CT Angio Chest W/Cm &/Or Wo Cm Result Date: 04/24/2024 CLINICAL DATA:  Tachycardia, hypoxemia. History of pulmonary embolus. EXAM: CT ANGIOGRAPHY CHEST WITH CONTRAST TECHNIQUE: Multidetector CT imaging of the chest was performed using the standard protocol during bolus administration of intravenous contrast. Multiplanar CT image reconstructions and MIPs were obtained to evaluate the vascular anatomy.  RADIATION DOSE REDUCTION: This exam was performed according to the departmental dose-optimization program which includes automated exposure control, adjustment of the mA and/or kV according to patient size and/or use of iterative reconstruction technique. CONTRAST:  75mL ISOVUE -370 IOPAMIDOL  (ISOVUE -370) INJECTION 76% COMPARISON:  05/18/2022 FINDINGS: Cardiovascular: Multiple filling defects are noted within pulmonary arteries in the right upper lobe and right middle lobe compatible with pulmonary emboli. Also noted in bilateral lower lobe pulmonary arterial subsegmental branches. Evidence of right heart strain with RV/LV ratio of 1.43. Heart is normal size. Mediastinum/Nodes: No mediastinal, hilar, or axillary adenopathy. Trachea and esophagus are unremarkable. Thyroid  unremarkable. Lungs/Pleura: No confluent airspace opacities or effusions. Dependent ground-glass opacities could reflect atelectasis or scarring, stable since prior study. No effusions. Upper Abdomen: No acute findings Musculoskeletal: Chest wall soft tissues are unremarkable. No acute bony abnormality. Review of the MIP images confirms the above findings. IMPRESSION: Multiple segmental and subsegmental pulmonary emboli noted bilaterally, right greater than left. CT evidence of right heart strain (RV/LV Ratio = 1.43) consistent with at least submassive (intermediate risk) PE. The presence of right heart strain has been associated with an increased risk of morbidity and mortality.  Please refer to the Code PE Focused order set in EPIC. Critical Value/emergent results were called by telephone at the time of interpretation on 04/24/2024 at 4:18 pm to provider [facility] , who verbally acknowledged these results. Electronically Signed   By: Franky Crease M.D.   On: 04/24/2024 16:18    Lab Results: Personally reviewed CBC    Component Value Date/Time   WBC 6.4 05/18/2022 1151   RBC 4.43 05/18/2022 1151   HGB 13.9 05/18/2022 1151   HCT  41.8 05/18/2022 1151   PLT 200 05/18/2022 1151   MCV 94.4 05/18/2022 1151   MCH 31.4 05/18/2022 1151   MCHC 33.3 05/18/2022 1151   RDW 13.0 05/18/2022 1151   LYMPHSABS 2.5 05/18/2010 2231   MONOABS 0.5 05/18/2010 2231   EOSABS 0.2 05/18/2010 2231   BASOSABS 0.0 05/18/2010 2231    BMET    Component Value Date/Time   NA 137 05/18/2022 1151   NA 138 09/22/2021 1021   K 4.1 05/18/2022 1151   CL 105 05/18/2022 1151   CO2 25 05/18/2022 1151   GLUCOSE 109 (H) 05/18/2022 1151   BUN 19 05/18/2022 1151   BUN 13 09/22/2021 1021   CREATININE 0.97 05/18/2022 1151   CALCIUM  9.2 05/18/2022 1151   GFRNONAA 77.71 02/28/2024 1305   GFRAA 84 06/15/2019 1102    BNP No results found for: BNP  ProBNP No results found for: PROBNP  Specialty Problems       Pulmonary Problems   Obstructive sleep apnea   NPSG 10/01/12- AHI 16.2/ hr, weight 172 lbs, mild limb jerks       Chronic cough   Allergic rhinitis due to pollen    Allergies  Allergen Reactions   Bee Venom Anaphylaxis   Levofloxacin      Muscle tightness-told to never take it again  Other Reaction(s): Other (See Comments)  Muscle tightness-told to never take it again, , Muscle tightness-told to never take it again Muscle tightness-told to never take it again  Other Reaction(s): Myalgia   Suvorexant Other (See Comments)    Other reaction(s): tongue,lip, face swelling  Other Reaction(s): Other (See Comments)  Other reaction(s): tongue,lip, face swelling, , Other reaction(s): tongue,lip, face swelling Other reaction(s): tongue,lip, face swelling  Other Reaction(s): Other (See Comments)    Other reaction(s): tongue,lip, face swelling  Other reaction(s): tongue,lip, face swelling Other reaction(s): tongue,lip, face swelling   Trazodone Other (See Comments)    Other reaction(s): prolonged erection  Other Reaction(s): Other (See Comments)  Other reaction(s): prolonged erection, , Other reaction(s): prolonged erection  Other reaction(s): prolonged erection  Other Reaction(s): Other (See Comments)    Other reaction(s): prolonged erection  Other reaction(s): prolonged erection Other reaction(s): prolonged erection   Amoxicillin-Pot Clavulanate Rash and Dermatitis    Severe rash  Severe rash, , Severe rash Severe rash  Other Reaction(s): Unknown  Severe rash Severe rash   Nsaids Nausea Only    Gi upset   Tolmetin Nausea Only and Other (See Comments)    Gi upset  Other Reaction(s): GI Intolerance  Gi upset, , Gi upset Gi upset  Other Reaction(s): Other (See Comments)    Gi upset  Gi upset Gi upset    Immunization History  Administered Date(s) Administered   H1N1 09/03/2008   INFLUENZA, HIGH DOSE SEASONAL PF 06/08/2018, 05/01/2019   Influenza Split 05/10/2007, 05/14/2009, 05/06/2010, 05/28/2011, 04/26/2012, 05/15/2013, 05/08/2014, 05/28/2015, 04/08/2017   Influenza,inj,quad, With Preservative 05/28/2015, 04/09/2016   PFIZER(Purple Top)SARS-COV-2 Vaccination 09/14/2019, 10/09/2019, 11/07/2020, 05/09/2021  Pneumococcal Conjugate-13 06/25/2015   Pneumococcal Polysaccharide-23 07/06/2016   Tdap 08/10/1995, 09/12/2008   Zoster Recombinant(Shingrix) 07/13/2018, 09/13/2018   Zoster, Live 09/30/2010, 06/09/2012    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   Cancer [facility])    Prostate   Coronary artery disease involving native coronary artery of native heart without angina pectoris 09/22/2021   CTA of chest 02/2021 - coronary artery calcifications   Depression    Elevated PSA    GERD (gastroesophageal reflux disease)    Glaucoma    Headache(784.0)    Hypertension    Obstructive sleep apnea 12/20/2012   NPSG 10/01/12- AHI 16.2/ hr, weight 172 lbs, mild limb jerks CPAP auto> 9 cwp APS    Pre-diabetes    Psoriasis    Pulmonary embolism (HCC) 08/22/2017    Multiple pulmonary emboli identified on CTa chest 08/15/17 at [facility]. Report in Media.  Eliquis  begun 08/2017   Seasonal allergies    Sinus bradycardia 09/22/2021   ZIO 2020 normal, Avg HR 60's. Chronotropic competence   Sleep apnea    using a cpap-moderate    Tobacco History: Social History   Tobacco Use  Smoking Status Former   Current packs/day: 0.00   Average packs/day: 1 pack/day for 5.0 years (5.0 ttl pk-yrs)   Types: Cigarettes   Start date: 08/09/1965   Quit date: 08/09/1970   Years since quitting: 53.8  Smokeless Tobacco Never   Counseling given: Not Answered   Continue to not smoke  Outpatient Encounter Medications as of 05/23/2024  Medication Sig   ALPRAZolam  (XANAX ) 1 MG tablet Take 1 mg by mouth at bedtime as needed for anxiety. Take a 0.5 as needed   apixaban  (ELIQUIS ) 5 MG TABS tablet Take 1 tablet (5 mg total) by mouth 2 (two) times daily.   aspirin EC 81 MG tablet Take 81 mg by mouth daily.   budesonide -formoterol  (SYMBICORT ) 160-4.5 MCG/ACT inhaler Inhale 2 puffs, then rinse mouth, twice daily- maintenance   cetirizine (ZYRTEC) 10 MG tablet Take 10 mg by mouth daily.   Clobetasol Propionate (TEMOVATE) 0.05 % external spray Apply topically 2 (two) times daily.   EPINEPHrine 0.3 mg/0.3 mL IJ SOAJ injection    escitalopram  (LEXAPRO ) 20 MG tablet Take 1 tablet (20 mg total) by mouth daily.   ezetimibe  (ZETIA ) 10 MG tablet Take 1 tablet by mouth once daily   hydrOXYzine  (ATARAX ) 25 MG tablet Take 1 tablet (25 mg total) by mouth 3 (three) times daily as needed.   ketoconazole (NIZORAL) 2 % cream Apply 1 Application topically daily.   latanoprost (XALATAN) 0.005 % ophthalmic solution Place 1 drop into both eyes at bedtime. 1 drop OU HS   leflunomide (ARAVA) 10 MG tablet Take 10 mg by mouth daily.   Melatonin 5 MG CAPS Take 5 mg by mouth at bedtime.   Multiple Vitamins-Minerals (PRESERVISION AREDS PO) Take 1 capsule by mouth daily.   omeprazole (PRILOSEC) 40  MG capsule Take 40 mg by mouth daily.   prazosin  (MINIPRESS ) 2 MG capsule Take 4 TABLETS BY MOUTH DAILY AT BEDTIME.   QUEtiapine  (SEROQUEL  XR) 50 MG TB24 24 hr tablet Take 1 tablet (50 mg total) by mouth at bedtime.   rosuvastatin  (CRESTOR ) 20 MG tablet TAKE 1 TABLET BY MOUTH EVERY DAY   tacrolimus (PROTOPIC) 0.1 % ointment Apply topically as needed.   telmisartan  (MICARDIS )  20 MG tablet Take 1 tablet (20 mg total) by mouth daily.   valACYclovir (VALTREX) 500 MG tablet as needed.   [DISCONTINUED] APIXABAN  (ELIQUIS ) VTE STARTER PACK (10MG  AND 5MG ) Take as directed on package: start with two-5mg  tablets twice daily for 7 days. On day 8, switch to one-5mg  tablet twice daily.   predniSONE  (DELTASONE ) 5 MG tablet 5 mg daily. Takes 5 mg total; 1/2 in AM & 1/2 in PM (Patient not taking: Reported on 05/23/2024)   No facility-administered encounter medications on file as of 05/23/2024.     Review of Systems  Review of Systems  N/a Physical Exam  BP (!) 163/80   Pulse 69   Ht 5' 7 (1.702 m)   Wt 208 lb (94.3 kg)   SpO2 95%   BMI 32.58 kg/m   Wt Readings from Last 5 Encounters:  05/23/24 208 lb (94.3 kg)  05/08/24 203 lb 12.8 oz (92.4 kg)  04/25/24 (P) 202 lb (91.6 kg)  04/23/24 202 lb (91.6 kg)  03/13/24 198 lb 9.6 oz (90.1 kg)    BMI Readings from Last 5 Encounters:  05/23/24 32.58 kg/m  05/08/24 31.92 kg/m  04/25/24 (P) 31.64 kg/m  04/23/24 31.64 kg/m  03/13/24 31.11 kg/m     Physical Exam General: Sitting in chair, no acute distress Eyes: EOMI, no icterus Neck: Supple, JVP Pulmonary: Clear, normal work of breathing Cardiovascular: Warm, no edema,  Abdomen: None Neuro: Normal gait, no weakness Psych: Normal mood, full affect  Assessment & Plan:   Pulmonary embolism, acute: 2 episodes.  Seems possibly provoked by preceding back injections of steroids.  Although not really a clear inciting event.  With RV unprovoked.  Given multiple episodes recommend indefinite  anticoagulation.  Eliquis  5 mg twice daily refilled today.  Instructed to stop 48 hours prior to upcoming surgery.  I responded to previous pharmacy staff message I received, I did not get confirmation or response back.  Dr. Saundra also responded to the urology office.  This should be very clear.  Potential asthma: Prescribed Symbicort  03/2024 workup shortness of breath.  This helped some.  He stopped using it.  Unclear why.  Encouraged him to resume Symbicort  and assess therapeutic response.  Reported hypoxemia: Unclear timing but at times it seems like it drops to mid to high 80s.  I can see no documentation in the healthcare setting of this.  I wonder if the measurement is inaccurate.  No real reason to be hypoxemic other than recent PE that should be adequately treated.  Ambulated on the oxygen  today and did not desaturate below the mid 90s.   Return in about 3 months (around 08/23/2024) for f/u Dr. Annella.   Donnice JONELLE Annella, MD 05/23/2024

## 2024-05-23 NOTE — Patient Instructions (Signed)
 Nice to see you again  I refilled the Eliquis   After your surgery, I recommend resuming the Symbicort  2 puffs twice a day.  Rinse your mouth out after every use.  This could improve the wheeze and shortness of breath.  I recommend increasing your activity, okay to get moving.  We will walk today to see if your oxygen  drops and if so we can order oxygen .  If not we are kind of stuck in terms of getting oxygen  via the insurance company.  Return to clinic in 3 months or sooner as needed with Dr. Annella

## 2024-05-24 ENCOUNTER — Ambulatory Visit: Admitting: Internal Medicine

## 2024-05-24 NOTE — Telephone Encounter (Signed)
 Pt seen in clinic by Dr. Annella on 05/23/24 and risk assessment done  Copy of this note faxed over to [facility]

## 2024-05-29 DIAGNOSIS — C61 Malignant neoplasm of prostate: Secondary | ICD-10-CM | POA: Diagnosis not present

## 2024-05-29 DIAGNOSIS — Z7901 Long term (current) use of anticoagulants: Secondary | ICD-10-CM | POA: Diagnosis not present

## 2024-05-29 DIAGNOSIS — R748 Abnormal levels of other serum enzymes: Secondary | ICD-10-CM | POA: Diagnosis not present

## 2024-05-29 DIAGNOSIS — R21 Rash and other nonspecific skin eruption: Secondary | ICD-10-CM | POA: Diagnosis not present

## 2024-05-29 DIAGNOSIS — R7301 Impaired fasting glucose: Secondary | ICD-10-CM | POA: Diagnosis not present

## 2024-05-29 DIAGNOSIS — Z86711 Personal history of pulmonary embolism: Secondary | ICD-10-CM | POA: Diagnosis not present

## 2024-06-01 ENCOUNTER — Encounter (HOSPITAL_COMMUNITY): Payer: Self-pay | Admitting: Urology

## 2024-06-01 ENCOUNTER — Ambulatory Visit (HOSPITAL_COMMUNITY)
Admission: RE | Admit: 2024-06-01 | Discharge: 2024-06-01 | Disposition: A | Discharge status: Home / Self Care | Attending: Urology | Admitting: Urology

## 2024-06-01 ENCOUNTER — Encounter (HOSPITAL_COMMUNITY): Admission: RE | Disposition: A | Payer: Self-pay | Source: Home / Self Care | Attending: Urology

## 2024-06-01 ENCOUNTER — Ambulatory Visit (HOSPITAL_COMMUNITY): Admitting: Anesthesiology

## 2024-06-01 DIAGNOSIS — Z77090 Contact with and (suspected) exposure to asbestos: Secondary | ICD-10-CM | POA: Diagnosis not present

## 2024-06-01 DIAGNOSIS — I10 Essential (primary) hypertension: Secondary | ICD-10-CM | POA: Insufficient documentation

## 2024-06-01 DIAGNOSIS — C61 Malignant neoplasm of prostate: Secondary | ICD-10-CM | POA: Diagnosis present

## 2024-06-01 DIAGNOSIS — F419 Anxiety disorder, unspecified: Secondary | ICD-10-CM | POA: Diagnosis not present

## 2024-06-01 DIAGNOSIS — K219 Gastro-esophageal reflux disease without esophagitis: Secondary | ICD-10-CM | POA: Insufficient documentation

## 2024-06-01 DIAGNOSIS — G4733 Obstructive sleep apnea (adult) (pediatric): Secondary | ICD-10-CM | POA: Insufficient documentation

## 2024-06-01 DIAGNOSIS — Z79899 Other long term (current) drug therapy: Secondary | ICD-10-CM | POA: Insufficient documentation

## 2024-06-01 DIAGNOSIS — F32A Depression, unspecified: Secondary | ICD-10-CM | POA: Insufficient documentation

## 2024-06-01 DIAGNOSIS — I2510 Atherosclerotic heart disease of native coronary artery without angina pectoris: Secondary | ICD-10-CM | POA: Diagnosis not present

## 2024-06-01 DIAGNOSIS — Z87891 Personal history of nicotine dependence: Secondary | ICD-10-CM

## 2024-06-01 DIAGNOSIS — Z86711 Personal history of pulmonary embolism: Secondary | ICD-10-CM | POA: Insufficient documentation

## 2024-06-01 HISTORY — DX: Prediabetes: R73.03

## 2024-06-01 HISTORY — PX: GOLD SEED IMPLANT: SHX6343

## 2024-06-01 HISTORY — DX: Malignant (primary) neoplasm, unspecified: C80.1

## 2024-06-01 HISTORY — PX: SPACE OAR INSTILLATION: SHX6769

## 2024-06-01 SURGERY — INSERTION, GOLD SEEDS
Anesthesia: Monitor Anesthesia Care | Site: Prostate

## 2024-06-01 MED ORDER — CHLORHEXIDINE GLUCONATE 0.12 % MT SOLN
OROMUCOSAL | Status: AC
  Filled 2024-06-01: qty 15

## 2024-06-01 MED ORDER — SODIUM CHLORIDE (PF) 0.9 % IJ SOLN
INTRAMUSCULAR | Status: DC | PRN
  Administered 2024-06-01: 10 mL

## 2024-06-01 MED ORDER — CEFAZOLIN SODIUM-DEXTROSE 2-4 GM/100ML-% IV SOLN
INTRAVENOUS | Status: AC
  Filled 2024-06-01: qty 100

## 2024-06-01 MED ORDER — FENTANYL CITRATE (PF) 100 MCG/2ML IJ SOLN: 25.0000 ug | INTRAMUSCULAR | Status: DC | PRN

## 2024-06-01 MED ORDER — DROPERIDOL 2.5 MG/ML IJ SOLN: 0.6250 mg | Freq: Once | INTRAMUSCULAR | Status: DC | PRN

## 2024-06-01 MED ORDER — CEFAZOLIN SODIUM-DEXTROSE 2-4 GM/100ML-% IV SOLN
2.0000 g | INTRAVENOUS | Status: AC
  Administered 2024-06-01: 2 g via INTRAVENOUS

## 2024-06-01 MED ORDER — BUPIVACAINE HCL (PF) 0.25 % IJ SOLN
INTRAMUSCULAR | Status: DC | PRN
Start: 2024-06-01 — End: 2024-06-01
  Administered 2024-06-01: 10 mL

## 2024-06-01 MED ORDER — PROPOFOL 10 MG/ML IV BOLUS
INTRAVENOUS | Status: AC
Start: 2024-06-01 — End: 2024-06-01
  Filled 2024-06-01: qty 20

## 2024-06-01 MED ORDER — LACTATED RINGERS IV SOLN: INTRAVENOUS | Status: DC

## 2024-06-01 MED ORDER — ORAL CARE MOUTH RINSE: 15.0000 mL | Freq: Once | OROMUCOSAL | Status: AC

## 2024-06-01 MED ORDER — OXYCODONE HCL 5 MG PO TABS: 5.0000 mg | ORAL_TABLET | Freq: Once | ORAL | Status: DC | PRN

## 2024-06-01 MED ORDER — PROPOFOL 500 MG/50ML IV EMUL
INTRAVENOUS | Status: DC | PRN
  Administered 2024-06-01: 100 mg via INTRAVENOUS
  Administered 2024-06-01 (×3): 50 mg via INTRAVENOUS

## 2024-06-01 MED ORDER — CHLORHEXIDINE GLUCONATE 0.12 % MT SOLN
15.0000 mL | Freq: Once | OROMUCOSAL | Status: AC
  Administered 2024-06-01: 15 mL via OROMUCOSAL

## 2024-06-01 MED ORDER — FLEET ENEMA RE ENEM
1.0000 | ENEMA | Freq: Once | RECTAL | Status: DC
Start: 2024-06-01 — End: 2024-06-01

## 2024-06-01 MED ORDER — ACETAMINOPHEN 10 MG/ML IV SOLN: 1000.0000 mg | Freq: Once | INTRAVENOUS | Status: DC | PRN

## 2024-06-01 MED ORDER — LIDOCAINE HCL (CARDIAC) PF 100 MG/5ML IV SOSY
PREFILLED_SYRINGE | INTRAVENOUS | Status: DC | PRN
  Administered 2024-06-01: 100 mg via INTRATRACHEAL

## 2024-06-01 MED ORDER — OXYCODONE HCL 5 MG/5ML PO SOLN: 5.0000 mg | Freq: Once | ORAL | Status: DC | PRN

## 2024-06-01 SURGICAL SUPPLY — 22 items
CNTNR URN SCR LID CUP LEK RST (MISCELLANEOUS) ×3 IMPLANT
COVER BACK TABLE 60X90IN (DRAPES) ×3 IMPLANT
DRSG TEGADERM 4X4.75 (GAUZE/BANDAGES/DRESSINGS) ×3 IMPLANT
DRSG TEGADERM 8X12 (GAUZE/BANDAGES/DRESSINGS) ×3 IMPLANT
GAUZE SPONGE 4X4 12PLY STRL (GAUZE/BANDAGES/DRESSINGS) ×3 IMPLANT
GLOVE BIO SURGEON STRL SZ7.5 (GLOVE) ×3 IMPLANT
GLOVE BIOGEL PI IND STRL 6.5 (GLOVE) IMPLANT
GLOVE SURG SS PI 6.5 STRL IVOR (GLOVE) IMPLANT
IMPL SPACEOAR SYSTEM 10ML (Spacer) ×3 IMPLANT
KIT TURNOVER KIT B (KITS) ×3 IMPLANT
MARKER GOLD PRELOAD 1.2X3 (Urological Implant) ×3 IMPLANT
MARKER SKIN DUAL TIP RULER LAB (MISCELLANEOUS) ×3 IMPLANT
NDL SPNL 22GX3.5 QUINCKE BK (NEEDLE) ×3 IMPLANT
NEEDLE SPNL 22GX3.5 QUINCKE BK (NEEDLE) ×2 IMPLANT
SHEATH ULTRASOUND LF (SHEATH) IMPLANT
SHEATH ULTRASOUND LTX NONSTRL (SHEATH) IMPLANT
SLEEVE SCD COMPRESS KNEE MED (STOCKING) ×3 IMPLANT
SURGILUBE 2OZ TUBE FLIPTOP (MISCELLANEOUS) ×3 IMPLANT
SYR 10ML LL (SYRINGE) ×3 IMPLANT
SYR CONTROL 10ML LL (SYRINGE) ×3 IMPLANT
TOWEL GREEN STERILE (TOWEL DISPOSABLE) ×3 IMPLANT
UNDERPAD 30X36 HEAVY ABSORB (UNDERPADS AND DIAPERS) ×3 IMPLANT

## 2024-06-01 NOTE — Discharge Instructions (Signed)
Activity:  You are encouraged to ambulate frequently (about every hour during waking hours) to help prevent blood clots from forming in your legs or lungs.   ? ?Diet: You should advance your diet as instructed by your physician.  It will be normal to have some bloating, nausea, and abdominal discomfort intermittently. ? ?Prescriptions:  You will be provided a prescription for pain medication to take as needed.  If your pain is not severe enough to require the prescription pain medication, you may take extra strength Tylenol instead which will have less side effects.  You should also take a prescribed stool softener to avoid straining with bowel movements as the prescription pain medication may constipate you. ? ?What to call us about: You should call the office ([phone]) if you develop fever > 101 or develop persistent vomiting. Activity:  You are encouraged to ambulate frequently (about every hour during waking hours) to help prevent blood clots from forming in your legs or lungs.  ?

## 2024-06-01 NOTE — H&P (Signed)
 Office Visit Report     05/08/2024   --------------------------------------------------------------------------------   Anthony [name]  MRN: [id number]  DOB: 07/23/1951, 73 year old Male  SSN:    PRIMARY CARE:  Dibas Koirala, MD  PRIMARY CARE FAX:  [phone]  REFERRING:  Sherwood BIRCH. Carolee MOULD,   PROVIDER:  Sherwood Carolee MOULD, M.D.  TREATING:  Gretel Ferrara, M.D.  LOCATION:  [facility] [phone]     --------------------------------------------------------------------------------   CC/HPI: CC: Prostate Cancer   Physician requesting consult: Dr. Oliva Carolee  PCP: Dr. Elfrieda Haggard   Mr. Weisel is a 73 year old gentleman with a longstanding history of bothersome lower urinary tract symptoms. He has been treated for both BPH and overactive bladder over the years. He was noted to have an elevated PSA of 5.15 prompting an MRI of the prostate on 01/30/2024 which was unremarkable. 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 5 out of 12 systematic biopsy cores were positive. He has undergone a urodynamic study on 8//25 which indicated a stable bladder with normal bladder sensation and a relatively high bladder capacity. His voiding phase indicated a maximum detrusor pressure of 55 cm of water with a maximum flow rate of 12 cc/s and a PVR of 181 cc after double voiding suggestive of an obstructive situation.   Family history: Father.   Imaging studies:  MRI (01/30/2024: No EPE, SVI, LAD, or bony lesions.   PMH: He has a history of hyperlipidemia, hypertension, GERD, anxiety, depression, glaucoma, sleep apnea, and history of pulmonary embolism.  PSH: No abdominal surgeries.   TNM stage: cT1c N0 Mx  PSA: 5.15  Gleason score: 4+3 = 7 (grade group 3)  Biopsy (03/22/24): 5/12 cores positive  Left: Left lateral base (20%, 3+3 = 6), left base (40%, 3+4 = 7)  Right: Right mid (20%, 4+3 = 7),  right lateral mid (5%, 3+3 = 6), right lateral base (10%, 4+3 = 7)  Prostate volume: 35.7 cc   Nomogram  OC disease: 42%  EPE: 56%  SVI: 11%  LNI: 12%  PFS (5 year, 10 year): 61%, 45%   Urinary function: IPSS is .  Erectile function: SHIM score is .     ALLERGIES: No Allergies    MEDICATIONS: Crestor  5 MG Tablet  Omeprazole  Alprazolam   Aspirin 81 MG Tablet Delayed Release  Clobex Spray 0.05 % Liquid  Coq10  Cpap  Eliquis   Epipen  Icaps  Latanoprost 0.005 % Solution  levoFLOXacin  750 MG Tablet 1 tablet PO once Pt to take 1 tab morning of procedure  Lexapro   Loratadine Allergy  Multivitamin  Norvasc  5 MG Tablet  predniSONE  5 MG Tablet  Probiotic  Protopic 0.1 % Ointment  Telmisartan  20 MG Tablet  Tylenol   Valtrex 500 MG Tablet  Vitamin B12     GU PSH: Complex cystometrogram, w/ void pressure and urethral pressure profile studies, any technique - 03/12/2024, 2020 Complex Uroflow - 03/12/2024, 2020 Emg surf Electrd - 03/12/2024, 2020 Inject For cystogram - 03/12/2024, 2020 Intrabd voidng Press - 03/12/2024, 2020 Prostate Needle Biopsy - 03/22/2024     NON-GU PSH: Carpal Tunnel Surgery.. Eye Surgery (Unspecified), multiple surgeries/ bilateral  Knee Arthroscopy, Left Neuroeltrd Stim Post Tibial - 2020, 2020, 2020, 2020, 2020, 2020, 2020, 2020, 2020, 2020, 2020 Sinus Surgery.. Surgical Pathology, Gross And Microscopic Examination For Prostate Needle - 03/22/2024 Visit Complexity (formerly GPC1X) - 03/29/2024, 10/27/2023, 09/22/2023  GU PMH: Prostate Cancer - 03/29/2024 Urinary Urgency - 03/29/2024, - 03/12/2024, - 10/27/2023, - 09/22/2023, - 2023, - 2022, - 2020 Elevated PSA - 03/22/2024 Incomplete bladder emptying - 03/12/2024, - 2020 BPH w/LUTS - 10/27/2023, - 09/22/2023, - 2023, - 2022, - 2020 Nocturia - 10/27/2023, - 09/22/2023, - 2020 Nocturnal Enuresis - 10/27/2023, - 09/22/2023 Urge incontinence - 10/27/2023, - 09/22/2023 Urinary Frequency - 09/22/2023, - 2020 Encounter for  Prostate Cancer screening - 2023, - 2018 Weak Urinary Stream - 2020 BPH w/o LUTS - 2018, - 2018 Overactive bladder - 2018    NON-GU PMH: Chronic fatigue, unspecified - 10/27/2023 Anxiety Arrhythmia Arthritis Depression GERD Glaucoma Pulmonary Embolism, History Sleep Apnea    FAMILY HISTORY: Breast Cancer - Mother COPD (chronic obstructive pulmonary disease) with - Father Kidney Stones - Father Lung Cancer - Mother melanoma - Father prostate cancer in father - Runs in Family   SOCIAL HISTORY: Marital Status: Married Preferred Language: English; Ethnicity: Not Hispanic Or Latino; Race: White Drinks 2 drinks per week. Social Drinker.     REVIEW OF SYSTEMS:    GU Review Male:   Patient denies frequent urination, hard to postpone urination, burning/ pain with urination, get up at night to urinate, leakage of urine, stream starts and stops, trouble starting your streams, and have to strain to urinate .  Gastrointestinal (Upper):   Patient denies nausea and vomiting.  Gastrointestinal (Lower):   Patient denies diarrhea and constipation.  Constitutional:   Patient denies fever, night sweats, weight loss, and fatigue.  Skin:   Patient denies skin rash/ lesion and itching.  Eyes:   Patient denies blurred vision and double vision.  Ears/ Nose/ Throat:   Patient denies sore throat and sinus problems.  Hematologic/Lymphatic:   Patient denies swollen glands and easy bruising.  Cardiovascular:   Patient denies leg swelling and chest pains.  Respiratory:   Patient denies cough and shortness of breath.  Endocrine:   Patient denies excessive thirst.  Musculoskeletal:   Patient denies back pain and joint pain.  Neurological:   Patient denies headaches and dizziness.  Psychologic:   Patient denies depression and anxiety.   VITAL SIGNS: None   MULTI-SYSTEM PHYSICAL EXAMINATION:    Constitutional: Well-nourished. No physical deformities. Normally developed. Good grooming.     Complexity  of Data:   01/04/24 10/27/23 10/05/21  PSA  Total PSA 5.15 ng/mL 4.71 ng/mL 3.84 ng/mL    10/27/23  Hormones  Testosterone , Total 340.9 ng/dL    PROCEDURES: None   ASSESSMENT: None   PLAN:           Document Letter(s):  Created for Patient: Clinical Summary         Next Appointment:      Next Appointment: 06/01/2024 10:30 AM    Appointment Type: Surgery     Location: [facility] [phone]    Provider: Donnice Siad, M.D.    Reason for Visit: OP--CONE--BRACHYTHERAPY/OAR    Urology Preoperative H&P   Chief Complaint: Prostate cancer  History of Present Illness: Anthony [name] is a 73 y.o. male with prostate cancer here for fiducial marker and SpaceOAR placement.    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   Cancer [facility])    Prostate   Coronary artery disease involving native coronary artery of native heart without angina pectoris 09/22/2021   CTA  of chest 02/2021 - coronary artery calcifications   Depression    Elevated PSA    GERD (gastroesophageal reflux disease)    Glaucoma    Headache(784.0)    Hypertension    Obstructive sleep apnea 12/20/2012   NPSG 10/01/12- AHI 16.2/ hr, weight 172 lbs, mild limb jerks CPAP auto> 9 cwp APS    Pre-diabetes    Psoriasis    Pulmonary embolism (HCC) 08/22/2017   Multiple pulmonary emboli identified on CTa chest 08/15/17 at [facility]. Report in Media.  Eliquis  begun 08/2017   Seasonal allergies    Sinus bradycardia 09/22/2021   ZIO 2020 normal, Avg HR 60's. Chronotropic competence   Sleep apnea    using a cpap-moderate    Past Surgical History:  Procedure Laterality Date   APPENDECTOMY     CARPAL TUNNEL WITH CUBITAL TUNNEL  08/09/2006   right   CARPAL TUNNEL WITH CUBITAL TUNNEL  08/10/2007   and ulnar nerve-left   ESOPHAGEAL MANOMETRY N/A 10/25/2018    Procedure: ESOPHAGEAL MANOMETRY (EM);  Surgeon: Dianna Specking, MD;  Location: [facility];  Service: Endoscopy;  Laterality: N/A;   EXTERNAL EAR SURGERY     ears reset surgically age 83   NAILBED REPAIR Right 05/08/2021   Procedure: RIGHT LONG FINGER NAIL UNIT BIOPSY;  Surgeon: Murrell Drivers, MD;  Location: [facility];  Service: Orthopedics;  Laterality: Right;   PH IMPEDANCE STUDY N/A 10/25/2018   Procedure: PH IMPEDANCE STUDY;  Surgeon: Dianna Specking, MD;  Location: [facility];  Service: Endoscopy;  Laterality: N/A;   PROSTATE BIOPSY     RETINAL DETACHMENT SURGERY  08/09/2002   right   RETINAL DETACHMENT SURGERY  08/09/2000   left   SHOULDER ARTHROSCOPY  08/09/2004   left   TONSILLECTOMY     TRIGGER FINGER RELEASE Left 02/15/2013   Procedure: RELEASE TRIGGER FINGER/A-1 PULLEY LEFT LONG FINGER;  Surgeon: Lamar LULLA Leonor Mickey., MD;  Location: [facility];  Service: Orthopedics;  Laterality: Left;   TRIGGER FINGER RELEASE Left 09/06/2013   Procedure: RELEASE TRIGGER FINGER/A-1 PULLEY;  Surgeon: Lamar LULLA Leonor Mickey., MD;  Location: [facility];  Service: Orthopedics;  Laterality: Left;   TRIGGER FINGER RELEASE Right 08/05/2014   Procedure: RIGHT SMALL TRIGGER RELEASE ;  Surgeon: Drivers Murrell, MD;  Location: [facility];  Service: Orthopedics;  Laterality: Right;   ULNAR NERVE TRANSPOSITION Left 09/06/2013   Procedure: LEFT ANTERIOR SUBCUTANEOUS TRANSPOSITION;  Surgeon: Lamar LULLA Leonor Mickey., MD;  Location: [facility];  Service: Orthopedics;  Laterality: Left;    Allergies:  Allergies  Allergen Reactions   Bee Venom Anaphylaxis   Levofloxacin      Muscle tightness-told to never take it again  Other Reaction(s): Other (See Comments)  Muscle tightness-told to never take it again, , Muscle tightness-told to never take it again Muscle tightness-told to never take it again  Other Reaction(s): Myalgia    Suvorexant Other (See Comments)    Other reaction(s): tongue,lip, face swelling  Other Reaction(s): Other (See Comments)  Other reaction(s): tongue,lip, face swelling, , Other reaction(s): tongue,lip, face swelling Other reaction(s): tongue,lip, face swelling  Other Reaction(s): Other (See Comments)    Other reaction(s): tongue,lip, face swelling  Other reaction(s): tongue,lip, face swelling Other reaction(s): tongue,lip, face swelling   Trazodone Other (See Comments)    Other reaction(s): prolonged erection  Other Reaction(s): Other (See Comments)  Other reaction(s): prolonged erection, , Other reaction(s): prolonged erection Other reaction(s): prolonged erection  Other  Reaction(s): Other (See Comments)    Other reaction(s): prolonged erection  Other reaction(s): prolonged erection Other reaction(s): prolonged erection   Amoxicillin-Pot Clavulanate Rash and Dermatitis    Severe rash  Severe rash, , Severe rash Severe rash  Other Reaction(s): Unknown  Severe rash Severe rash   Nsaids Nausea Only    Gi upset   Tolmetin Nausea Only and Other (See Comments)    Gi upset  Other Reaction(s): GI Intolerance  Gi upset, , Gi upset Gi upset  Other Reaction(s): Other (See Comments)    Gi upset  Gi upset Gi upset    Family History  Problem Relation Age of Onset   Polymyalgia rheumatica Sister     Social History:  reports that he quit smoking about 53 years ago. His smoking use included cigarettes. He started smoking about 58 years ago. He has a 5 pack-year smoking history. He has never used smokeless tobacco. He reports current alcohol use of about 7.0 standard drinks of alcohol per week. He reports that he does not use drugs.  ROS: A complete review of systems was performed.  All systems are negative except for pertinent findings as noted.  Physical Exam:  Vital signs in last 24 hours: Temp:  [98 F (36.7 C)] 98 F (36.7 C) (10/24 0905) Pulse Rate:  [79] 79 (10/24  0905) Resp:  [18] 18 (10/24 0905) BP: (120)/(82) 120/82 (10/24 0905) SpO2:  [93 %] 93 % (10/24 0905) Weight:  [90.7 kg] 90.7 kg (10/24 0905) Constitutional:  Alert and oriented, No acute distress Cardiovascular: Regular rate and rhythm Respiratory: Normal respiratory effort, Lungs clear bilaterally GI: Abdomen is soft, nontender, nondistended, no abdominal masses GU: No CVA tenderness Lymphatic: No lymphadenopathy Neurologic: Grossly intact, no focal deficits Psychiatric: Normal mood and affect  Laboratory Data:  No results for input(s): WBC, HGB, HCT, PLT in the last 72 hours.  No results for input(s): NA, K, CL, GLUCOSE, BUN, CALCIUM , CREATININE in the last 72 hours.  Invalid input(s): CO3   No results found for this or any previous visit (from the past 24 hours). No results found for this or any previous visit (from the past 240 hours).  Renal Function: No results for input(s): CREATININE in the last 168 hours. CrCl cannot be calculated (Patient's most recent lab result is older than the maximum 21 days allowed.).  Radiologic Imaging: No results found.  I independently reviewed the above imaging studies.  Assessment and Plan KELDRIC POYER is a 73 y.o. male with prostate cancer here for fiducial marker and SpaceOAR placement.     Matt R. Danaya Geddis MD 06/01/2024, 10:07 AM  [facility] Pager: [phone]): [phone]

## 2024-06-01 NOTE — Anesthesia Procedure Notes (Signed)
 Procedure Name: MAC Date/Time: 06/01/2024 10:17 AM  Performed by: Viviana Almarie DASEN, CRNAPre-anesthesia Checklist: Patient identified, Suction available, Patient being monitored, Emergency Drugs available and Timeout performed Patient Re-evaluated:Patient Re-evaluated prior to induction Oxygen  Delivery Method: Nasal cannula Induction Type: IV induction Placement Confirmation: positive ETCO2

## 2024-06-01 NOTE — Anesthesia Preprocedure Evaluation (Signed)
 Anesthesia Evaluation  Patient identified by MRN, date of birth, ID band Patient awake    Reviewed: Allergy & Precautions, NPO status , Patient's Chart, lab work & pertinent test results  History of Anesthesia Complications Negative for: history of anesthetic complications  Airway Mallampati: II  TM Distance: <3 FB Neck ROM: Full    Dental no notable dental hx. (+) Teeth Intact, Dental Advisory Given   Pulmonary shortness of breath and with exertion, asthma , sleep apnea and Continuous Positive Airway Pressure Ventilation , neg COPD, Patient abstained from smoking.Not current smoker, former smoker, PE Hx of asbestos exposure Room air sat 95%   Pulmonary exam normal breath sounds clear to auscultation       Cardiovascular Exercise Tolerance: Good METShypertension, Pt. on medications + CAD  (-) Past MI Normal cardiovascular exam(-) dysrhythmias  Rhythm:Regular Rate:Normal  05/2019 TTE  1. Left ventricular ejection fraction, by visual estimation, is 65 to  70%. The left ventricle has hyperdynamic function. There is no left  ventricular hypertrophy.   2. Left ventricular diastolic parameters are consistent with Grade II  diastolic dysfunction (pseudonormalization).   3. Global right ventricle has normal systolic function.The right  ventricular size is normal. No increase in right ventricular wall  thickness.   4. Left atrial size was normal.   5. Right atrial size was normal.   6. The mitral valve is normal in structure. No evidence of mitral valve  regurgitation. No evidence of mitral stenosis.   7. The tricuspid valve is normal in structure. Tricuspid valve  regurgitation is not demonstrated.   8. The aortic valve is normal in structure. Aortic valve regurgitation is  not visualized. No evidence of aortic valve sclerosis or stenosis.   9. The pulmonic valve was normal in structure. Pulmonic valve  regurgitation is not  visualized.  10. Normal pulmonary artery systolic pressure.  11. The atrial septum is grossly normal.    Neuro/Psych  Headaches PSYCHIATRIC DISORDERS Anxiety Depression       GI/Hepatic Neg liver ROS,GERD  Controlled,,  Endo/Other  negative endocrine ROSneg diabetes    Renal/GU negative Renal ROS     Musculoskeletal negative musculoskeletal ROS (+)    Abdominal  (+) + obese (BMI 30.54)  Peds  Hematology negative hematology ROS (+)   Anesthesia Other Findings Past Medical History: 09/14/2016: Asbestos exposure     Comment:  Worked as a Chief Technology Officer many years ago,               disturbing asbestos insulation and also smoking at that               time No date: Cancer (HCC)     Comment:  Prostate 09/22/2021: Coronary artery disease involving native coronary artery  of native heart without angina pectoris     Comment:  CTA of chest 02/2021 - coronary artery calcifications No date: Depression No date: Elevated PSA No date: GERD (gastroesophageal reflux disease) No date: Glaucoma No date: Headache(784.0) No date: Hypertension 12/20/2012: Obstructive sleep apnea     Comment:  NPSG 10/01/12- AHI 16.2/ hr, weight 172 lbs, mild limb               jerks CPAP auto> 9 cwp APS  No date: Pre-diabetes No date: Psoriasis 08/22/2017: Pulmonary embolism (HCC)     Comment:  Multiple pulmonary emboli identified on CTa chest 08/15/17              at [facility]. Report in Media.  Eliquis  begun               08/2017 No date: Seasonal allergies 09/22/2021: Sinus bradycardia     Comment:  ZIO 2020 normal, Avg HR 60's. Chronotropic competence No date: Sleep apnea     Comment:  using a cpap-moderate  Reproductive/Obstetrics                              Anesthesia Physical Anesthesia Plan  ASA: 3  Anesthesia Plan: MAC   Post-op Pain Management: Minimal or no pain anticipated   Induction: Intravenous  PONV Risk Score and Plan: 2 and Propofol   infusion, TIVA and Ondansetron   Airway Management Planned: Nasal Cannula  Additional Equipment: None  Intra-op Plan:   Post-operative Plan:   Informed Consent: I have reviewed the patients History and Physical, chart, labs and discussed the procedure including the risks, benefits and alternatives for the proposed anesthesia with the patient or authorized representative who has indicated his/her understanding and acceptance.     Dental advisory given  Plan Discussed with: CRNA and Surgeon  Anesthesia Plan Comments: (Discussed risks of anesthesia with patient, including possibility of difficulty with spontaneous ventilation under anesthesia necessitating airway intervention, PONV, and rare risks such as cardiac or respiratory or neurological events, and allergic reactions. Discussed the role of CRNA in patient's perioperative care. Patient understands.)         Anesthesia Quick Evaluation

## 2024-06-01 NOTE — Op Note (Signed)
 Operative Note  Preoperative diagnosis:  1.  Clinically localized adenocarcinoma of the prostate  Postoperative diagnosis: 1.  Clinically localized adenocarcinoma of the prostate  Procedure(s): 1. Placement of fiducial markers into prostate 2. Insertion of SpaceOAR hydrogel   Surgeon: Donnice Siad, MD  Assistants:  None  Anesthesia:  General  Complications:  None  EBL:  Minimal  Specimens: 1. None  Drains/Catheters: 1.  None  Indication:  Anthony [name] is a 73 y.o. male with clinically localized prostate cancer. After discussing management options for treatment, he elected to proceed with radiotherapy. He presents today for the above procedures. The potential risks, complications, alternative options, and expected recovery course have been discussed in detail with the patient and he has provided informed consent to proceed.  Description of procedure: The patient was administered preoperative antibiotics, placed in the dorsal lithotomy position, and prepped and draped in the usual sterile fashion. Next, transrectal ultrasonography was utilized to visualize the prostate. Three gold fiducial markers were then placed into the prostate via transperineal needles under ultrasound guidance at the right apex, right base, and left mid gland under direct ultrasound guidance. A site in the midline was then selected on the perineum for placement of an 18 g needle with saline. The needle was advanced above the rectum and below Denonvillier's fascia to the mid gland and confirmed to be in the midline on transverse imaging. One cc of saline was injected confirming appropriate expansion of this space. A total of 5 cc of saline was then injected to open the space further bilaterally. The saline syringe was then removed and the SpaceOAR hydrogel was injected with good distribution bilaterally. He tolerated the procedure well and without complications. He was given a voiding trial prior to discharge  from the PACU.  Matt R. Diedra Sinor MD [facility]  Pager: [phone]

## 2024-06-01 NOTE — Transfer of Care (Signed)
 Immediate Anesthesia Transfer of Care Note  Patient: Anthony [name]  Procedure(s) Performed: INSERTION, GOLD SEEDS (Prostate) INJECTION, HYDROGEL SPACER  Patient Location: PACU  Anesthesia Type:MAC  Level of Consciousness: awake, alert , oriented, and patient cooperative  Airway & Oxygen  Therapy: Patient Spontanous Breathing and Patient connected to nasal cannula oxygen   Post-op Assessment: Report given to RN, Post -op Vital signs reviewed and stable, Patient moving all extremities X 4, and Patient able to stick tongue midline  Post vital signs: Reviewed and stable  Last Vitals:  Vitals Value Taken Time  BP 114/67 06/01/24 10:48  Temp 36.5 C 06/01/24 10:48  Pulse 72 06/01/24 10:50  Resp 24 06/01/24 10:50  SpO2 93 % 06/01/24 10:50  Vitals shown include unfiled device data.  Last Pain:  Vitals:   06/01/24 1048  TempSrc:   PainSc: 0-No pain      Patients Stated Pain Goal: 8 (06/01/24 0905)  Complications: No notable events documented.

## 2024-06-01 NOTE — Anesthesia Postprocedure Evaluation (Signed)
 Anesthesia Post Note  Patient: Anthony [name]  Procedure(s) Performed: INSERTION, GOLD SEEDS (Prostate) INJECTION, HYDROGEL SPACER     Patient location during evaluation: PACU Anesthesia Type: MAC Level of consciousness: awake and alert Pain management: pain level controlled Vital Signs Assessment: post-procedure vital signs reviewed and stable Respiratory status: spontaneous breathing, nonlabored ventilation, respiratory function stable and patient connected to nasal cannula oxygen  Cardiovascular status: stable and blood pressure returned to baseline Postop Assessment: no apparent nausea or vomiting Anesthetic complications: no   No notable events documented.  Last Vitals:  Vitals:   06/01/24 1100 06/01/24 1115  BP: 114/73 125/70  Pulse: 64 69  Resp: (!) 22 (!) 21  Temp:  36.5 C  SpO2: 94% 96%    Last Pain:  Vitals:   06/01/24 1115  TempSrc:   PainSc: 0-No pain                 Rome Ade

## 2024-06-04 ENCOUNTER — Other Ambulatory Visit: Payer: Self-pay

## 2024-06-04 ENCOUNTER — Encounter (HOSPITAL_COMMUNITY): Payer: Self-pay | Admitting: Urology

## 2024-06-04 ENCOUNTER — Telehealth: Payer: Self-pay | Admitting: *Deleted

## 2024-06-04 DIAGNOSIS — C61 Malignant neoplasm of prostate: Secondary | ICD-10-CM

## 2024-06-04 NOTE — Telephone Encounter (Signed)
 CALLED PATIENT TO REMIND OF SIM APPT. FOR 06-05-24- ARRIVAL TIME- 10:15 AM @ [facility], INFORMED PATIENT TO ARRIVE WITH A FULL BLADDER, PATIENT TO REPORT TO HOSPITAL AFTER SIM FOR MRI, SPOKE WITH PATIENT AND HE IS AWARE OF THESE APPTS. AND THE INSTRUCTIONS

## 2024-06-05 ENCOUNTER — Ambulatory Visit (HOSPITAL_COMMUNITY)
Admission: RE | Admit: 2024-06-05 | Discharge: 2024-06-05 | Disposition: A | Discharge status: Home / Self Care | Source: Ambulatory Visit | Attending: Urology | Admitting: Urology

## 2024-06-05 ENCOUNTER — Ambulatory Visit
Admission: RE | Admit: 2024-06-05 | Discharge: 2024-06-05 | Disposition: A | Discharge status: Home / Self Care | Source: Ambulatory Visit | Attending: Radiation Oncology | Admitting: Radiation Oncology

## 2024-06-05 ENCOUNTER — Ambulatory Visit (HOSPITAL_COMMUNITY)

## 2024-06-05 ENCOUNTER — Ambulatory Visit (HOSPITAL_COMMUNITY): Admission: RE | Admit: 2024-06-05 | Source: Ambulatory Visit

## 2024-06-05 DIAGNOSIS — C61 Malignant neoplasm of prostate: Secondary | ICD-10-CM | POA: Insufficient documentation

## 2024-06-05 DIAGNOSIS — Z510 Encounter for antineoplastic radiation therapy: Secondary | ICD-10-CM | POA: Diagnosis not present

## 2024-06-05 DIAGNOSIS — Z191 Hormone sensitive malignancy status: Secondary | ICD-10-CM | POA: Diagnosis not present

## 2024-06-05 NOTE — Progress Notes (Signed)
  Radiation Oncology         (336) [phone] ________________________________  Name: Anthony [name] MRN: [id number]  Date: 06/05/2024  DOB: 1951-03-27  SIMULATION AND TREATMENT PLANNING NOTE    ICD-10-CM   1. Malignant neoplasm of prostate (HCC)  C61       DIAGNOSIS:  73 y.o. gentleman with Stage T1c adenocarcinoma of the prostate with Gleason score of 4+3, and PSA of 5.15.   NARRATIVE:  The patient was brought to the CT Simulation planning suite.  Identity was confirmed.  All relevant records and images related to the planned course of therapy were reviewed.  The patient freely provided informed written consent to proceed with treatment after reviewing the details related to the planned course of therapy. The consent form was witnessed and verified by the simulation staff.  Then, the patient was set-up in a stable reproducible supine position for radiation therapy.  A vacuum lock pillow device was custom fabricated to position his legs in a reproducible immobilized position.  Then, I performed a urethrogram under sterile conditions to identify the prostatic apex.  CT images were obtained.  Surface markings were placed.  The CT images were loaded into the planning software.  Then the prostate target and avoidance structures including the rectum, bladder, bowel and hips were contoured.  Treatment planning then occurred.  The radiation prescription was entered and confirmed.  A total of one complex treatment devices was fabricated. I have requested : Intensity Modulated Radiotherapy (IMRT) is medically necessary for this case for the following reason:  Rectal sparing.SABRA  PLAN:  The patient will receive 70 Gy in 28 fractions.  ________________________________  Donnice FELIX Patrcia, M.D.

## 2024-06-06 ENCOUNTER — Telehealth: Payer: Self-pay

## 2024-06-06 NOTE — Telephone Encounter (Signed)
 Copied from CRM [id number]. Topic: Clinical - Medication Question >> Jun 06, 2024  1:58 PM Russell PARAS wrote: Reason for CRM:   Pt's wife, Romero, is contacting clinic regarding the pt's prednisone . She is wanting to know if it is ok for pt to resume taking his prednisone , 5 mg/day(2.5 mg during the day, 2.5 mg at night). She is concerned due to his history of pulmonary embolism and would like to verify with provider.   CB#  [phone]

## 2024-06-07 ENCOUNTER — Telehealth: Payer: Self-pay

## 2024-06-07 NOTE — Telephone Encounter (Signed)
 Spoke w/ PT per MH states he's fine with it but the patient should contact the MD who Rx it in the first place.   PT VBU.   -NFN

## 2024-06-07 NOTE — Telephone Encounter (Signed)
 When was it last taken?  What is it for and who is prescribing or managing this medication?

## 2024-06-07 NOTE — Telephone Encounter (Signed)
 Mr. Anthony [name], 73 y.o. gentleman with Stage T1c adenocarcinoma of the prostate with Gleason score of 4+3, and PSA of 5.15.  He will be starting treatment 06/14/2024.  Wife left message wanting to know if he can continue taking his Prednisone  5 mg medication or will it affect the radiation treatment.  He currently is not taking it per wife but wants to start taking it again.  Reports stop taking on 05/23/2024.  He takes Prednisone  2.5 mg in morning and 2.5 mg in the evening.  Mrs. Pratte has also reached out to his rheumatologist & pulmonologist since he does have a history of PE's.  Mrs. Merkel reports they thought it was the cause for his PE and that's why he stopped taking medication but she was told that it may not be. Mr. Beavers has been taking medication for 6 years now for rheumatoid arthritis and is starting to have pain.  He been off medication for few weeks.  RN advised her the medication wouldn't affect radiation treatment but that he should talk with his rheumatologist about restarting Prednisone  after being off for some time to rule out withdrawal effects.  RN made Sabra Rusk, PA-C & Dr. Patrcia aware of the call.

## 2024-06-11 ENCOUNTER — Ambulatory Visit (INDEPENDENT_AMBULATORY_CARE_PROVIDER_SITE_OTHER): Admitting: Behavioral Health

## 2024-06-11 ENCOUNTER — Encounter: Payer: Self-pay | Admitting: Behavioral Health

## 2024-06-11 DIAGNOSIS — G4752 REM sleep behavior disorder: Secondary | ICD-10-CM

## 2024-06-11 DIAGNOSIS — F99 Mental disorder, not otherwise specified: Secondary | ICD-10-CM

## 2024-06-11 DIAGNOSIS — F5105 Insomnia due to other mental disorder: Secondary | ICD-10-CM

## 2024-06-11 DIAGNOSIS — F411 Generalized anxiety disorder: Secondary | ICD-10-CM | POA: Diagnosis not present

## 2024-06-11 MED ORDER — ESCITALOPRAM OXALATE 20 MG PO TABS
20.0000 mg | ORAL_TABLET | Freq: Every day | ORAL | 1 refills | Status: AC
Start: 2024-06-11 — End: ?

## 2024-06-11 MED ORDER — QUETIAPINE FUMARATE ER 50 MG PO TB24: 50.0000 mg | ORAL_TABLET | Freq: Every day | ORAL | 1 refills | Status: AC

## 2024-06-11 MED ORDER — HYDROXYZINE HCL 25 MG PO TABS: 25.0000 mg | ORAL_TABLET | Freq: Three times a day (TID) | ORAL | 1 refills | Status: AC | PRN

## 2024-06-11 MED ORDER — PRAZOSIN HCL 2 MG PO CAPS: ORAL_CAPSULE | ORAL | 1 refills | Status: DC

## 2024-06-11 NOTE — Progress Notes (Signed)
 [facility]  Patient ID: Anthony [name],  MRN: [id number]  PCP: Regino Slater, MD  Date of Evaluation: 06/11/2024 Time spent:30 minutes  Chief Complaint:  Chief Complaint   Anxiety; Follow-up; Medication Refill; Patient Education; Insomnia     HISTORY/CURRENT STATUS: HPI  Anthony [name] 73 year old male presents to this office for follow up and medication management. Says that his medication are working well overall.  Discussed his recent health decline. Recently diagnosed with prostate cancer, dx, pulmonary embolism.  Says that he is just taking it one day at a time. Will be starting radiation soon. For now is requesting no medication changes.   Says his depression level today is 2/10 and anxiety is 4/10. Says that he is sleeping 7-8  hours per night.  Patient says that he feels safe and has strong family support from his wife.  Denies any hx of mania, no psychosis, no current SI or HI.        Individual Medical History/ Review of Systems: Changes? :No   Allergies: Bee venom, Levofloxacin , Suvorexant, Trazodone, Amoxicillin-pot clavulanate, Nsaids, and Tolmetin  Current Medications:  Current Outpatient Medications:    ALPRAZolam  (XANAX ) 1 MG tablet, Take 1 mg by mouth at bedtime as needed for anxiety. Take a 0.5 as needed, Disp: , Rfl:    apixaban  (ELIQUIS ) 5 MG TABS tablet, Take 1 tablet (5 mg total) by mouth 2 (two) times daily., Disp: 60 tablet, Rfl: 11   aspirin EC 81 MG tablet, Take 81 mg by mouth daily., Disp: , Rfl:    budesonide -formoterol  (SYMBICORT ) 160-4.5 MCG/ACT inhaler, Inhale 2 puffs, then rinse mouth, twice daily- maintenance, Disp: 1 each, Rfl: 6   cetirizine (ZYRTEC) 10 MG tablet, Take 10 mg by mouth daily., Disp: , Rfl:    Clobetasol Propionate (TEMOVATE) 0.05 % external spray, Apply topically 2 (two) times daily., Disp: , Rfl:    EPINEPHrine 0.3 mg/0.3 mL IJ SOAJ injection, , Disp: , Rfl:    escitalopram  (LEXAPRO ) 20 MG tablet, Take 1 tablet (20  mg total) by mouth daily., Disp: 90 tablet, Rfl: 1   ezetimibe  (ZETIA ) 10 MG tablet, Take 1 tablet by mouth once daily, Disp: 90 tablet, Rfl: 2   hydrOXYzine  (ATARAX ) 25 MG tablet, Take 1 tablet (25 mg total) by mouth 3 (three) times daily as needed., Disp: 270 tablet, Rfl: 1   ketoconazole (NIZORAL) 2 % cream, Apply 1 Application topically daily., Disp: , Rfl:    latanoprost (XALATAN) 0.005 % ophthalmic solution, Place 1 drop into both eyes at bedtime. 1 drop OU HS, Disp: , Rfl:    leflunomide (ARAVA) 10 MG tablet, Take 10 mg by mouth daily., Disp: , Rfl:    Melatonin 5 MG CAPS, Take 5 mg by mouth at bedtime., Disp: , Rfl:    Multiple Vitamins-Minerals (PRESERVISION AREDS PO), Take 1 capsule by mouth daily., Disp: , Rfl:    omeprazole (PRILOSEC) 40 MG capsule, Take 40 mg by mouth daily., Disp: , Rfl:    prazosin  (MINIPRESS ) 2 MG capsule, Take 4 TABLETS BY MOUTH DAILY AT BEDTIME., Disp: 360 capsule, Rfl: 1   predniSONE  (DELTASONE ) 5 MG tablet, 5 mg daily. Takes 5 mg total; 1/2 in AM & 1/2 in PM (Patient not taking: No sig reported), Disp: , Rfl:    QUEtiapine  (SEROQUEL  XR) 50 MG TB24 24 hr tablet, Take 1 tablet (50 mg total) by mouth at bedtime., Disp: 90 tablet, Rfl: 1   rosuvastatin  (CRESTOR ) 20 MG tablet, TAKE 1 TABLET BY MOUTH EVERY  DAY, Disp: 90 tablet, Rfl: 2   tacrolimus (PROTOPIC) 0.1 % ointment, Apply topically as needed., Disp: , Rfl:    telmisartan  (MICARDIS ) 20 MG tablet, Take 1 tablet (20 mg total) by mouth daily., Disp: 90 tablet, Rfl: 3   valACYclovir (VALTREX) 500 MG tablet, as needed., Disp: , Rfl:  Medication Side Effects: none  Family Medical/ Social History: Changes? No  MENTAL HEALTH EXAM:  There were no vitals taken for this visit.There is no height or weight on file to calculate BMI.  General Appearance: Casual, Neat, and Well Groomed  Eye Contact:  Good  Speech:  Clear and Coherent  Volume:  Normal  Mood:  NA  Affect:  Appropriate  Thought Process:  Coherent   Orientation:  Full (Time, Place, and Person)  Thought Content: Logical   Suicidal Thoughts:  No  Homicidal Thoughts:  No  Memory:  WNL  Judgement:  Good  Insight:  Good  Psychomotor Activity:  Normal  Concentration:  Concentration: Good  Recall:  Good  Fund of Knowledge: Good  Language: Good  Assets:  Desire for Improvement  ADL's:  Intact  Cognition: WNL  Prognosis:  Good    DIAGNOSES:    ICD-10-CM   1. 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 We discussed his concerns with multiple health issues today. He is  trying to keep positive attitude. He is at risk for hopelessness. Strong family support. Wife is retired engineer, civil (consulting).  He does not want to make any medication adjustments or changes today.    We agreed today: To continue hydroxyzine  25 mg three times daily as needed for agitation, or increased anxiety.  To continue Xanax  0.5 mg twice daily as needed for anxiety To continue Lexapro  20 mg daily To continue Seroquel  to 50 mg daily Continue Prazosin  6 mg at bedtime.  To follow up in 12 weeks to reassess To report worsening symptoms promptly Provided emergency contact information Discussed potential metabolic side effects associated with atypical antipsychotics, as well as potential risk for movement side effects. Advised pt to contact office if movement side effects occur.   Discussed potential  benefits, risk, and side effects of benzodiazepines to include potential risk of tolerance and dependence, as well as possible drowsiness.  Advised patient not to drive if experiencing drowsiness and to take lowest possible effective dose to minimize risk of dependence and tolerance.  Reviewed PDMP     Redell DELENA Pizza, NP

## 2024-06-12 ENCOUNTER — Encounter

## 2024-06-12 ENCOUNTER — Ambulatory Visit
Admission: RE | Admit: 2024-06-12 | Discharge: 2024-06-12 | Disposition: A | Discharge status: Home / Self Care | Source: Ambulatory Visit | Attending: Radiation Oncology | Admitting: Radiation Oncology

## 2024-06-12 ENCOUNTER — Ambulatory Visit: Admitting: Internal Medicine

## 2024-06-12 DIAGNOSIS — Z510 Encounter for antineoplastic radiation therapy: Secondary | ICD-10-CM | POA: Diagnosis not present

## 2024-06-12 DIAGNOSIS — C61 Malignant neoplasm of prostate: Secondary | ICD-10-CM | POA: Diagnosis not present

## 2024-06-12 DIAGNOSIS — Z191 Hormone sensitive malignancy status: Secondary | ICD-10-CM | POA: Diagnosis not present

## 2024-06-14 ENCOUNTER — Ambulatory Visit
Admission: RE | Admit: 2024-06-14 | Discharge: 2024-06-14 | Disposition: A | Discharge status: Home / Self Care | Source: Ambulatory Visit | Attending: Radiation Oncology | Admitting: Radiation Oncology

## 2024-06-14 ENCOUNTER — Other Ambulatory Visit: Payer: Self-pay

## 2024-06-14 DIAGNOSIS — Z191 Hormone sensitive malignancy status: Secondary | ICD-10-CM | POA: Diagnosis not present

## 2024-06-14 DIAGNOSIS — C61 Malignant neoplasm of prostate: Secondary | ICD-10-CM | POA: Diagnosis not present

## 2024-06-14 DIAGNOSIS — Z510 Encounter for antineoplastic radiation therapy: Secondary | ICD-10-CM | POA: Diagnosis not present

## 2024-06-14 LAB — RAD ONC ARIA SESSION SUMMARY
Course Elapsed Days: 0
Plan Fractions Treated to Date: 1
Plan Prescribed Dose Per Fraction: 2.5 Gy
Plan Total Fractions Prescribed: 28
Plan Total Prescribed Dose: 70 Gy
Reference Point Dosage Given to Date: 2.5 Gy
Reference Point Session Dosage Given: 2.5 Gy
Session Number: 1

## 2024-06-15 ENCOUNTER — Ambulatory Visit

## 2024-06-15 ENCOUNTER — Ambulatory Visit
Admission: RE | Admit: 2024-06-15 | Discharge: 2024-06-15 | Disposition: A | Discharge status: Home / Self Care | Source: Ambulatory Visit | Attending: Radiation Oncology | Admitting: Radiation Oncology

## 2024-06-15 ENCOUNTER — Other Ambulatory Visit: Payer: Self-pay

## 2024-06-15 DIAGNOSIS — Z510 Encounter for antineoplastic radiation therapy: Secondary | ICD-10-CM | POA: Diagnosis not present

## 2024-06-15 DIAGNOSIS — C61 Malignant neoplasm of prostate: Secondary | ICD-10-CM | POA: Diagnosis not present

## 2024-06-15 DIAGNOSIS — Z191 Hormone sensitive malignancy status: Secondary | ICD-10-CM | POA: Diagnosis not present

## 2024-06-15 LAB — RAD ONC ARIA SESSION SUMMARY
Course Elapsed Days: 1
Plan Fractions Treated to Date: 2
Plan Prescribed Dose Per Fraction: 2.5 Gy
Plan Total Fractions Prescribed: 28
Plan Total Prescribed Dose: 70 Gy
Reference Point Dosage Given to Date: 5 Gy
Reference Point Session Dosage Given: 2.5 Gy
Session Number: 2

## 2024-06-18 ENCOUNTER — Ambulatory Visit
Admission: RE | Admit: 2024-06-18 | Discharge: 2024-06-18 | Disposition: A | Discharge status: Home / Self Care | Source: Ambulatory Visit | Attending: Radiation Oncology | Admitting: Radiation Oncology

## 2024-06-18 ENCOUNTER — Other Ambulatory Visit: Payer: Self-pay

## 2024-06-18 DIAGNOSIS — C61 Malignant neoplasm of prostate: Secondary | ICD-10-CM | POA: Diagnosis not present

## 2024-06-18 DIAGNOSIS — Z510 Encounter for antineoplastic radiation therapy: Secondary | ICD-10-CM | POA: Diagnosis not present

## 2024-06-18 DIAGNOSIS — Z191 Hormone sensitive malignancy status: Secondary | ICD-10-CM | POA: Diagnosis not present

## 2024-06-18 LAB — RAD ONC ARIA SESSION SUMMARY
Course Elapsed Days: 4
Plan Fractions Treated to Date: 3
Plan Prescribed Dose Per Fraction: 2.5 Gy
Plan Total Fractions Prescribed: 28
Plan Total Prescribed Dose: 70 Gy
Reference Point Dosage Given to Date: 7.5 Gy
Reference Point Session Dosage Given: 2.5 Gy
Session Number: 3

## 2024-06-19 ENCOUNTER — Other Ambulatory Visit: Payer: Self-pay

## 2024-06-19 ENCOUNTER — Ambulatory Visit
Admission: RE | Admit: 2024-06-19 | Discharge: 2024-06-19 | Disposition: A | Discharge status: Home / Self Care | Source: Ambulatory Visit | Attending: Radiation Oncology | Admitting: Radiation Oncology

## 2024-06-19 DIAGNOSIS — Z510 Encounter for antineoplastic radiation therapy: Secondary | ICD-10-CM | POA: Diagnosis not present

## 2024-06-19 DIAGNOSIS — Z191 Hormone sensitive malignancy status: Secondary | ICD-10-CM | POA: Diagnosis not present

## 2024-06-19 DIAGNOSIS — C61 Malignant neoplasm of prostate: Secondary | ICD-10-CM | POA: Diagnosis not present

## 2024-06-19 LAB — RAD ONC ARIA SESSION SUMMARY
Course Elapsed Days: 5
Plan Fractions Treated to Date: 4
Plan Prescribed Dose Per Fraction: 2.5 Gy
Plan Total Fractions Prescribed: 28
Plan Total Prescribed Dose: 70 Gy
Reference Point Dosage Given to Date: 10 Gy
Reference Point Session Dosage Given: 2.5 Gy
Session Number: 4

## 2024-06-20 ENCOUNTER — Other Ambulatory Visit: Payer: Self-pay

## 2024-06-20 ENCOUNTER — Ambulatory Visit
Admission: RE | Admit: 2024-06-20 | Discharge: 2024-06-20 | Disposition: A | Discharge status: Home / Self Care | Source: Ambulatory Visit | Attending: Radiation Oncology | Admitting: Radiation Oncology

## 2024-06-20 DIAGNOSIS — Z191 Hormone sensitive malignancy status: Secondary | ICD-10-CM | POA: Diagnosis not present

## 2024-06-20 DIAGNOSIS — C61 Malignant neoplasm of prostate: Secondary | ICD-10-CM | POA: Diagnosis not present

## 2024-06-20 DIAGNOSIS — Z510 Encounter for antineoplastic radiation therapy: Secondary | ICD-10-CM | POA: Diagnosis not present

## 2024-06-20 LAB — RAD ONC ARIA SESSION SUMMARY
Course Elapsed Days: 6
Plan Fractions Treated to Date: 5
Plan Prescribed Dose Per Fraction: 2.5 Gy
Plan Total Fractions Prescribed: 28
Plan Total Prescribed Dose: 70 Gy
Reference Point Dosage Given to Date: 12.5 Gy
Reference Point Session Dosage Given: 2.5 Gy
Session Number: 5

## 2024-06-21 ENCOUNTER — Other Ambulatory Visit: Payer: Self-pay

## 2024-06-21 ENCOUNTER — Ambulatory Visit
Admission: RE | Admit: 2024-06-21 | Discharge: 2024-06-21 | Disposition: A | Discharge status: Home / Self Care | Source: Ambulatory Visit | Attending: Radiation Oncology | Admitting: Radiation Oncology

## 2024-06-21 DIAGNOSIS — Z510 Encounter for antineoplastic radiation therapy: Secondary | ICD-10-CM | POA: Diagnosis not present

## 2024-06-21 DIAGNOSIS — C61 Malignant neoplasm of prostate: Secondary | ICD-10-CM | POA: Diagnosis not present

## 2024-06-21 DIAGNOSIS — Z191 Hormone sensitive malignancy status: Secondary | ICD-10-CM | POA: Diagnosis not present

## 2024-06-21 LAB — RAD ONC ARIA SESSION SUMMARY
Course Elapsed Days: 7
Plan Fractions Treated to Date: 6
Plan Prescribed Dose Per Fraction: 2.5 Gy
Plan Total Fractions Prescribed: 28
Plan Total Prescribed Dose: 70 Gy
Reference Point Dosage Given to Date: 15 Gy
Reference Point Session Dosage Given: 2.5 Gy
Session Number: 6

## 2024-06-22 ENCOUNTER — Ambulatory Visit
Admission: RE | Admit: 2024-06-22 | Discharge: 2024-06-22 | Disposition: A | Discharge status: Home / Self Care | Source: Ambulatory Visit | Attending: Radiation Oncology | Admitting: Radiation Oncology

## 2024-06-22 ENCOUNTER — Other Ambulatory Visit: Payer: Self-pay

## 2024-06-22 ENCOUNTER — Ambulatory Visit

## 2024-06-22 DIAGNOSIS — Z191 Hormone sensitive malignancy status: Secondary | ICD-10-CM | POA: Diagnosis not present

## 2024-06-22 DIAGNOSIS — Z510 Encounter for antineoplastic radiation therapy: Secondary | ICD-10-CM | POA: Diagnosis not present

## 2024-06-22 DIAGNOSIS — C61 Malignant neoplasm of prostate: Secondary | ICD-10-CM | POA: Diagnosis not present

## 2024-06-22 LAB — RAD ONC ARIA SESSION SUMMARY
Course Elapsed Days: 8
Plan Fractions Treated to Date: 7
Plan Prescribed Dose Per Fraction: 2.5 Gy
Plan Total Fractions Prescribed: 28
Plan Total Prescribed Dose: 70 Gy
Reference Point Dosage Given to Date: 17.5 Gy
Reference Point Session Dosage Given: 2.5 Gy
Session Number: 7

## 2024-06-25 ENCOUNTER — Other Ambulatory Visit: Payer: Self-pay

## 2024-06-25 ENCOUNTER — Ambulatory Visit
Admission: RE | Admit: 2024-06-25 | Discharge: 2024-06-25 | Disposition: A | Source: Ambulatory Visit | Attending: Radiation Oncology

## 2024-06-25 ENCOUNTER — Ambulatory Visit

## 2024-06-25 DIAGNOSIS — Z191 Hormone sensitive malignancy status: Secondary | ICD-10-CM | POA: Diagnosis not present

## 2024-06-25 DIAGNOSIS — Z510 Encounter for antineoplastic radiation therapy: Secondary | ICD-10-CM | POA: Diagnosis not present

## 2024-06-25 DIAGNOSIS — C61 Malignant neoplasm of prostate: Secondary | ICD-10-CM | POA: Diagnosis not present

## 2024-06-25 LAB — RAD ONC ARIA SESSION SUMMARY
Course Elapsed Days: 11
Plan Fractions Treated to Date: 8
Plan Prescribed Dose Per Fraction: 2.5 Gy
Plan Total Fractions Prescribed: 28
Plan Total Prescribed Dose: 70 Gy
Reference Point Dosage Given to Date: 20 Gy
Reference Point Session Dosage Given: 2.5 Gy
Session Number: 8

## 2024-06-26 ENCOUNTER — Other Ambulatory Visit: Payer: Self-pay

## 2024-06-26 ENCOUNTER — Ambulatory Visit
Admission: RE | Admit: 2024-06-26 | Discharge: 2024-06-26 | Disposition: A | Source: Ambulatory Visit | Attending: Radiation Oncology

## 2024-06-26 DIAGNOSIS — Z510 Encounter for antineoplastic radiation therapy: Secondary | ICD-10-CM | POA: Diagnosis not present

## 2024-06-26 DIAGNOSIS — Z191 Hormone sensitive malignancy status: Secondary | ICD-10-CM | POA: Diagnosis not present

## 2024-06-26 DIAGNOSIS — C61 Malignant neoplasm of prostate: Secondary | ICD-10-CM | POA: Diagnosis not present

## 2024-06-26 LAB — RAD ONC ARIA SESSION SUMMARY
Course Elapsed Days: 12
Plan Fractions Treated to Date: 9
Plan Prescribed Dose Per Fraction: 2.5 Gy
Plan Total Fractions Prescribed: 28
Plan Total Prescribed Dose: 70 Gy
Reference Point Dosage Given to Date: 22.5 Gy
Reference Point Session Dosage Given: 2.5 Gy
Session Number: 9

## 2024-06-27 ENCOUNTER — Other Ambulatory Visit: Payer: Self-pay

## 2024-06-27 ENCOUNTER — Ambulatory Visit
Admission: RE | Admit: 2024-06-27 | Discharge: 2024-06-27 | Disposition: A | Source: Ambulatory Visit | Attending: Radiation Oncology

## 2024-06-27 DIAGNOSIS — C61 Malignant neoplasm of prostate: Secondary | ICD-10-CM | POA: Diagnosis not present

## 2024-06-27 DIAGNOSIS — Z510 Encounter for antineoplastic radiation therapy: Secondary | ICD-10-CM | POA: Diagnosis not present

## 2024-06-27 DIAGNOSIS — Z191 Hormone sensitive malignancy status: Secondary | ICD-10-CM | POA: Diagnosis not present

## 2024-06-27 LAB — RAD ONC ARIA SESSION SUMMARY
Course Elapsed Days: 13
Plan Fractions Treated to Date: 10
Plan Prescribed Dose Per Fraction: 2.5 Gy
Plan Total Fractions Prescribed: 28
Plan Total Prescribed Dose: 70 Gy
Reference Point Dosage Given to Date: 25 Gy
Reference Point Session Dosage Given: 2.5 Gy
Session Number: 10

## 2024-06-28 ENCOUNTER — Ambulatory Visit
Admission: RE | Admit: 2024-06-28 | Discharge: 2024-06-28 | Disposition: A | Source: Ambulatory Visit | Attending: Radiation Oncology

## 2024-06-28 ENCOUNTER — Other Ambulatory Visit: Payer: Self-pay

## 2024-06-28 DIAGNOSIS — Z191 Hormone sensitive malignancy status: Secondary | ICD-10-CM | POA: Diagnosis not present

## 2024-06-28 DIAGNOSIS — C61 Malignant neoplasm of prostate: Secondary | ICD-10-CM | POA: Diagnosis not present

## 2024-06-28 DIAGNOSIS — Z510 Encounter for antineoplastic radiation therapy: Secondary | ICD-10-CM | POA: Diagnosis not present

## 2024-06-28 LAB — RAD ONC ARIA SESSION SUMMARY
Course Elapsed Days: 14
Plan Fractions Treated to Date: 11
Plan Prescribed Dose Per Fraction: 2.5 Gy
Plan Total Fractions Prescribed: 28
Plan Total Prescribed Dose: 70 Gy
Reference Point Dosage Given to Date: 27.5 Gy
Reference Point Session Dosage Given: 2.5 Gy
Session Number: 11

## 2024-06-29 ENCOUNTER — Ambulatory Visit
Admission: RE | Admit: 2024-06-29 | Discharge: 2024-06-29 | Disposition: A | Discharge status: Home / Self Care | Source: Ambulatory Visit | Attending: Radiation Oncology | Admitting: Radiation Oncology

## 2024-06-29 ENCOUNTER — Other Ambulatory Visit: Payer: Self-pay

## 2024-06-29 ENCOUNTER — Ambulatory Visit

## 2024-06-29 DIAGNOSIS — Z510 Encounter for antineoplastic radiation therapy: Secondary | ICD-10-CM | POA: Diagnosis not present

## 2024-06-29 DIAGNOSIS — C61 Malignant neoplasm of prostate: Secondary | ICD-10-CM | POA: Diagnosis not present

## 2024-06-29 DIAGNOSIS — Z191 Hormone sensitive malignancy status: Secondary | ICD-10-CM | POA: Diagnosis not present

## 2024-06-29 LAB — RAD ONC ARIA SESSION SUMMARY
Course Elapsed Days: 15
Plan Fractions Treated to Date: 12
Plan Prescribed Dose Per Fraction: 2.5 Gy
Plan Total Fractions Prescribed: 28
Plan Total Prescribed Dose: 70 Gy
Reference Point Dosage Given to Date: 30 Gy
Reference Point Session Dosage Given: 2.5 Gy
Session Number: 12

## 2024-07-01 ENCOUNTER — Ambulatory Visit
Admission: RE | Admit: 2024-07-01 | Discharge: 2024-07-01 | Disposition: A | Discharge status: Home / Self Care | Source: Ambulatory Visit | Attending: Radiation Oncology | Admitting: Radiation Oncology

## 2024-07-01 ENCOUNTER — Ambulatory Visit
Admission: RE | Admit: 2024-07-01 | Discharge: 2024-07-01 | Disposition: A | Source: Ambulatory Visit | Attending: Radiation Oncology

## 2024-07-01 ENCOUNTER — Other Ambulatory Visit: Payer: Self-pay

## 2024-07-01 DIAGNOSIS — C61 Malignant neoplasm of prostate: Secondary | ICD-10-CM | POA: Diagnosis not present

## 2024-07-01 DIAGNOSIS — Z191 Hormone sensitive malignancy status: Secondary | ICD-10-CM | POA: Diagnosis not present

## 2024-07-01 DIAGNOSIS — Z510 Encounter for antineoplastic radiation therapy: Secondary | ICD-10-CM | POA: Diagnosis not present

## 2024-07-01 LAB — RAD ONC ARIA SESSION SUMMARY
Course Elapsed Days: 17
Plan Fractions Treated to Date: 13
Plan Prescribed Dose Per Fraction: 2.5 Gy
Plan Total Fractions Prescribed: 28
Plan Total Prescribed Dose: 70 Gy
Reference Point Dosage Given to Date: 32.5 Gy
Reference Point Session Dosage Given: 2.5 Gy
Session Number: 13

## 2024-07-02 ENCOUNTER — Other Ambulatory Visit: Payer: Self-pay

## 2024-07-02 ENCOUNTER — Ambulatory Visit
Admission: RE | Admit: 2024-07-02 | Discharge: 2024-07-02 | Disposition: A | Source: Ambulatory Visit | Attending: Radiation Oncology

## 2024-07-02 DIAGNOSIS — C61 Malignant neoplasm of prostate: Secondary | ICD-10-CM | POA: Diagnosis not present

## 2024-07-02 DIAGNOSIS — Z510 Encounter for antineoplastic radiation therapy: Secondary | ICD-10-CM | POA: Diagnosis not present

## 2024-07-02 DIAGNOSIS — Z191 Hormone sensitive malignancy status: Secondary | ICD-10-CM | POA: Diagnosis not present

## 2024-07-02 LAB — RAD ONC ARIA SESSION SUMMARY
Course Elapsed Days: 18
Plan Fractions Treated to Date: 14
Plan Prescribed Dose Per Fraction: 2.5 Gy
Plan Total Fractions Prescribed: 28
Plan Total Prescribed Dose: 70 Gy
Reference Point Dosage Given to Date: 35 Gy
Reference Point Session Dosage Given: 2.5 Gy
Session Number: 14

## 2024-07-03 ENCOUNTER — Other Ambulatory Visit: Payer: Self-pay

## 2024-07-03 ENCOUNTER — Ambulatory Visit
Admission: RE | Admit: 2024-07-03 | Discharge: 2024-07-03 | Disposition: A | Discharge status: Home / Self Care | Source: Ambulatory Visit | Attending: Radiation Oncology | Admitting: Radiation Oncology

## 2024-07-03 DIAGNOSIS — Z510 Encounter for antineoplastic radiation therapy: Secondary | ICD-10-CM | POA: Diagnosis not present

## 2024-07-03 DIAGNOSIS — C61 Malignant neoplasm of prostate: Secondary | ICD-10-CM | POA: Diagnosis not present

## 2024-07-03 DIAGNOSIS — Z191 Hormone sensitive malignancy status: Secondary | ICD-10-CM | POA: Diagnosis not present

## 2024-07-03 LAB — RAD ONC ARIA SESSION SUMMARY
Course Elapsed Days: 19
Plan Fractions Treated to Date: 15
Plan Prescribed Dose Per Fraction: 2.5 Gy
Plan Total Fractions Prescribed: 28
Plan Total Prescribed Dose: 70 Gy
Reference Point Dosage Given to Date: 37.5 Gy
Reference Point Session Dosage Given: 2.5 Gy
Session Number: 15

## 2024-07-04 ENCOUNTER — Other Ambulatory Visit: Payer: Self-pay

## 2024-07-04 ENCOUNTER — Ambulatory Visit
Admission: RE | Admit: 2024-07-04 | Discharge: 2024-07-04 | Disposition: A | Discharge status: Home / Self Care | Source: Ambulatory Visit | Attending: Radiation Oncology | Admitting: Radiation Oncology

## 2024-07-04 ENCOUNTER — Ambulatory Visit
Admission: RE | Admit: 2024-07-04 | Discharge: 2024-07-04 | Disposition: A | Source: Ambulatory Visit | Attending: Radiation Oncology

## 2024-07-04 DIAGNOSIS — C61 Malignant neoplasm of prostate: Secondary | ICD-10-CM | POA: Diagnosis not present

## 2024-07-04 DIAGNOSIS — Z510 Encounter for antineoplastic radiation therapy: Secondary | ICD-10-CM | POA: Diagnosis not present

## 2024-07-04 DIAGNOSIS — Z191 Hormone sensitive malignancy status: Secondary | ICD-10-CM | POA: Diagnosis not present

## 2024-07-04 LAB — RAD ONC ARIA SESSION SUMMARY
Course Elapsed Days: 20
Plan Fractions Treated to Date: 16
Plan Prescribed Dose Per Fraction: 2.5 Gy
Plan Total Fractions Prescribed: 28
Plan Total Prescribed Dose: 70 Gy
Reference Point Dosage Given to Date: 40 Gy
Reference Point Session Dosage Given: 2.5 Gy
Session Number: 16

## 2024-07-09 ENCOUNTER — Other Ambulatory Visit: Payer: Self-pay

## 2024-07-09 ENCOUNTER — Ambulatory Visit
Admission: RE | Admit: 2024-07-09 | Discharge: 2024-07-09 | Disposition: A | Source: Ambulatory Visit | Attending: Radiation Oncology

## 2024-07-09 DIAGNOSIS — Z191 Hormone sensitive malignancy status: Secondary | ICD-10-CM | POA: Diagnosis not present

## 2024-07-09 DIAGNOSIS — Z510 Encounter for antineoplastic radiation therapy: Secondary | ICD-10-CM | POA: Diagnosis present

## 2024-07-09 DIAGNOSIS — E119 Type 2 diabetes mellitus without complications: Secondary | ICD-10-CM | POA: Diagnosis not present

## 2024-07-09 DIAGNOSIS — R748 Abnormal levels of other serum enzymes: Secondary | ICD-10-CM | POA: Diagnosis not present

## 2024-07-09 DIAGNOSIS — C61 Malignant neoplasm of prostate: Secondary | ICD-10-CM | POA: Diagnosis present

## 2024-07-09 LAB — RAD ONC ARIA SESSION SUMMARY
Course Elapsed Days: 25
Plan Fractions Treated to Date: 17
Plan Prescribed Dose Per Fraction: 2.5 Gy
Plan Total Fractions Prescribed: 28
Plan Total Prescribed Dose: 70 Gy
Reference Point Dosage Given to Date: 42.5 Gy
Reference Point Session Dosage Given: 2.5 Gy
Session Number: 17

## 2024-07-10 ENCOUNTER — Other Ambulatory Visit: Payer: Self-pay

## 2024-07-10 ENCOUNTER — Ambulatory Visit
Admission: RE | Admit: 2024-07-10 | Discharge: 2024-07-10 | Disposition: A | Source: Ambulatory Visit | Attending: Radiation Oncology

## 2024-07-10 DIAGNOSIS — Z510 Encounter for antineoplastic radiation therapy: Secondary | ICD-10-CM | POA: Diagnosis not present

## 2024-07-10 DIAGNOSIS — C61 Malignant neoplasm of prostate: Secondary | ICD-10-CM | POA: Diagnosis not present

## 2024-07-10 DIAGNOSIS — Z191 Hormone sensitive malignancy status: Secondary | ICD-10-CM | POA: Diagnosis not present

## 2024-07-10 LAB — RAD ONC ARIA SESSION SUMMARY
Course Elapsed Days: 26
Plan Fractions Treated to Date: 18
Plan Prescribed Dose Per Fraction: 2.5 Gy
Plan Total Fractions Prescribed: 28
Plan Total Prescribed Dose: 70 Gy
Reference Point Dosage Given to Date: 45 Gy
Reference Point Session Dosage Given: 0.7804 Gy
Session Number: 18

## 2024-07-11 ENCOUNTER — Ambulatory Visit
Admission: RE | Admit: 2024-07-11 | Discharge: 2024-07-11 | Disposition: A | Source: Ambulatory Visit | Attending: Radiation Oncology

## 2024-07-11 ENCOUNTER — Other Ambulatory Visit: Payer: Self-pay

## 2024-07-11 DIAGNOSIS — Z510 Encounter for antineoplastic radiation therapy: Secondary | ICD-10-CM | POA: Diagnosis not present

## 2024-07-11 DIAGNOSIS — Z191 Hormone sensitive malignancy status: Secondary | ICD-10-CM | POA: Diagnosis not present

## 2024-07-11 DIAGNOSIS — C61 Malignant neoplasm of prostate: Secondary | ICD-10-CM | POA: Diagnosis not present

## 2024-07-11 LAB — RAD ONC ARIA SESSION SUMMARY
Course Elapsed Days: 27
Plan Fractions Treated to Date: 19
Plan Prescribed Dose Per Fraction: 2.5 Gy
Plan Total Fractions Prescribed: 28
Plan Total Prescribed Dose: 70 Gy
Reference Point Dosage Given to Date: 47.5 Gy
Reference Point Session Dosage Given: 2.5 Gy
Session Number: 19

## 2024-07-12 ENCOUNTER — Other Ambulatory Visit: Payer: Self-pay

## 2024-07-12 ENCOUNTER — Ambulatory Visit
Admission: RE | Admit: 2024-07-12 | Discharge: 2024-07-12 | Disposition: A | Source: Ambulatory Visit | Attending: Radiation Oncology

## 2024-07-12 DIAGNOSIS — Z510 Encounter for antineoplastic radiation therapy: Secondary | ICD-10-CM | POA: Diagnosis not present

## 2024-07-12 DIAGNOSIS — Z191 Hormone sensitive malignancy status: Secondary | ICD-10-CM | POA: Diagnosis not present

## 2024-07-12 DIAGNOSIS — C61 Malignant neoplasm of prostate: Secondary | ICD-10-CM | POA: Diagnosis not present

## 2024-07-12 LAB — RAD ONC ARIA SESSION SUMMARY
Course Elapsed Days: 28
Plan Fractions Treated to Date: 20
Plan Prescribed Dose Per Fraction: 2.5 Gy
Plan Total Fractions Prescribed: 28
Plan Total Prescribed Dose: 70 Gy
Reference Point Dosage Given to Date: 50 Gy
Reference Point Session Dosage Given: 2.5 Gy
Session Number: 20

## 2024-07-13 ENCOUNTER — Ambulatory Visit
Admission: RE | Admit: 2024-07-13 | Discharge: 2024-07-13 | Disposition: A | Source: Ambulatory Visit | Attending: Radiation Oncology

## 2024-07-13 ENCOUNTER — Other Ambulatory Visit: Payer: Self-pay

## 2024-07-13 DIAGNOSIS — C61 Malignant neoplasm of prostate: Secondary | ICD-10-CM | POA: Diagnosis not present

## 2024-07-13 DIAGNOSIS — Z510 Encounter for antineoplastic radiation therapy: Secondary | ICD-10-CM | POA: Diagnosis not present

## 2024-07-13 DIAGNOSIS — Z191 Hormone sensitive malignancy status: Secondary | ICD-10-CM | POA: Diagnosis not present

## 2024-07-13 LAB — RAD ONC ARIA SESSION SUMMARY
Course Elapsed Days: 29
Plan Fractions Treated to Date: 21
Plan Prescribed Dose Per Fraction: 2.5 Gy
Plan Total Fractions Prescribed: 28
Plan Total Prescribed Dose: 70 Gy
Reference Point Dosage Given to Date: 52.5 Gy
Reference Point Session Dosage Given: 2.5 Gy
Session Number: 21

## 2024-07-16 ENCOUNTER — Ambulatory Visit
Admission: RE | Admit: 2024-07-16 | Discharge: 2024-07-16 | Disposition: A | Source: Ambulatory Visit | Attending: Radiation Oncology

## 2024-07-16 ENCOUNTER — Other Ambulatory Visit: Payer: Self-pay

## 2024-07-16 DIAGNOSIS — C61 Malignant neoplasm of prostate: Secondary | ICD-10-CM | POA: Diagnosis not present

## 2024-07-16 DIAGNOSIS — Z510 Encounter for antineoplastic radiation therapy: Secondary | ICD-10-CM | POA: Diagnosis not present

## 2024-07-16 DIAGNOSIS — Z191 Hormone sensitive malignancy status: Secondary | ICD-10-CM | POA: Diagnosis not present

## 2024-07-16 LAB — RAD ONC ARIA SESSION SUMMARY
Course Elapsed Days: 32
Plan Fractions Treated to Date: 22
Plan Prescribed Dose Per Fraction: 2.5 Gy
Plan Total Fractions Prescribed: 28
Plan Total Prescribed Dose: 70 Gy
Reference Point Dosage Given to Date: 55 Gy
Reference Point Session Dosage Given: 2.5 Gy
Session Number: 22

## 2024-07-17 ENCOUNTER — Ambulatory Visit
Admission: RE | Admit: 2024-07-17 | Discharge: 2024-07-17 | Disposition: A | Source: Ambulatory Visit | Attending: Radiation Oncology

## 2024-07-17 ENCOUNTER — Other Ambulatory Visit: Payer: Self-pay

## 2024-07-17 DIAGNOSIS — C61 Malignant neoplasm of prostate: Secondary | ICD-10-CM | POA: Diagnosis not present

## 2024-07-17 DIAGNOSIS — Z191 Hormone sensitive malignancy status: Secondary | ICD-10-CM | POA: Diagnosis not present

## 2024-07-17 DIAGNOSIS — Z510 Encounter for antineoplastic radiation therapy: Secondary | ICD-10-CM | POA: Diagnosis not present

## 2024-07-17 LAB — RAD ONC ARIA SESSION SUMMARY
Course Elapsed Days: 33
Plan Fractions Treated to Date: 23
Plan Prescribed Dose Per Fraction: 2.5 Gy
Plan Total Fractions Prescribed: 28
Plan Total Prescribed Dose: 70 Gy
Reference Point Dosage Given to Date: 57.5 Gy
Reference Point Session Dosage Given: 2.5 Gy
Session Number: 23

## 2024-07-18 ENCOUNTER — Other Ambulatory Visit: Payer: Self-pay | Admitting: Cardiology

## 2024-07-18 ENCOUNTER — Telehealth: Payer: Self-pay | Admitting: Cardiology

## 2024-07-18 ENCOUNTER — Other Ambulatory Visit: Payer: Self-pay

## 2024-07-18 ENCOUNTER — Ambulatory Visit
Admission: RE | Admit: 2024-07-18 | Discharge: 2024-07-18 | Disposition: A | Discharge status: Home / Self Care | Source: Ambulatory Visit | Attending: Radiation Oncology | Admitting: Radiation Oncology

## 2024-07-18 DIAGNOSIS — Z510 Encounter for antineoplastic radiation therapy: Secondary | ICD-10-CM | POA: Diagnosis not present

## 2024-07-18 DIAGNOSIS — C61 Malignant neoplasm of prostate: Secondary | ICD-10-CM | POA: Diagnosis not present

## 2024-07-18 DIAGNOSIS — Z191 Hormone sensitive malignancy status: Secondary | ICD-10-CM | POA: Diagnosis not present

## 2024-07-18 DIAGNOSIS — K760 Fatty (change of) liver, not elsewhere classified: Secondary | ICD-10-CM | POA: Diagnosis not present

## 2024-07-18 DIAGNOSIS — R748 Abnormal levels of other serum enzymes: Secondary | ICD-10-CM | POA: Diagnosis not present

## 2024-07-18 LAB — RAD ONC ARIA SESSION SUMMARY
Course Elapsed Days: 34
Plan Fractions Treated to Date: 24
Plan Prescribed Dose Per Fraction: 2.5 Gy
Plan Total Fractions Prescribed: 28
Plan Total Prescribed Dose: 70 Gy
Reference Point Dosage Given to Date: 60 Gy
Reference Point Session Dosage Given: 2.5 Gy
Session Number: 24

## 2024-07-18 NOTE — Telephone Encounter (Signed)
 Spoke to patient he stated he started taking Mounjaro 3 weeks ago.Stated his B/P has been dropping more since he started taking and he has blacked out twice.B/P this week has been 140/70 range pulse 74.Stated he is due to take Mounjaro again this Friday.He wanted to know if his B/P medication needs to be stopped.Advised I would hold Mounjaro.I will send message to Dr.Skains for advice.

## 2024-07-18 NOTE — Telephone Encounter (Signed)
 Pt c/o BP issue: STAT if pt c/o blurred vision, one-sided weakness or slurred speech.  STAT if BP is GREATER than 180/120 TODAY.  STAT if BP is LESS than 90/60 and SYMPTOMATIC TODAY  1. What is your BP concern? 143/80 , pt had an episode in the parking lot today, pt stated he squatted down and got back up he was light headed    2. Have you taken any BP medication today?No       3. What are your last 5 BP readings?Na   4. Are you having any other symptoms (ex. Dizziness, headache, blurred vision, passed out)? He was felt dizzy when he had a little episode today   Best number [phone]

## 2024-07-19 ENCOUNTER — Other Ambulatory Visit: Payer: Self-pay

## 2024-07-19 ENCOUNTER — Ambulatory Visit
Admission: RE | Admit: 2024-07-19 | Discharge: 2024-07-19 | Disposition: A | Discharge status: Home / Self Care | Source: Ambulatory Visit | Attending: Radiation Oncology | Admitting: Radiation Oncology

## 2024-07-19 DIAGNOSIS — Z191 Hormone sensitive malignancy status: Secondary | ICD-10-CM | POA: Diagnosis not present

## 2024-07-19 DIAGNOSIS — Z510 Encounter for antineoplastic radiation therapy: Secondary | ICD-10-CM | POA: Diagnosis not present

## 2024-07-19 DIAGNOSIS — C61 Malignant neoplasm of prostate: Secondary | ICD-10-CM | POA: Diagnosis not present

## 2024-07-19 LAB — RAD ONC ARIA SESSION SUMMARY
Course Elapsed Days: 35
Plan Fractions Treated to Date: 25
Plan Prescribed Dose Per Fraction: 2.5 Gy
Plan Total Fractions Prescribed: 28
Plan Total Prescribed Dose: 70 Gy
Reference Point Dosage Given to Date: 62.5 Gy
Reference Point Session Dosage Given: 2.5 Gy
Session Number: 25

## 2024-07-20 ENCOUNTER — Other Ambulatory Visit: Payer: Self-pay

## 2024-07-20 ENCOUNTER — Ambulatory Visit
Admission: RE | Admit: 2024-07-20 | Discharge: 2024-07-20 | Disposition: A | Source: Ambulatory Visit | Attending: Radiation Oncology

## 2024-07-20 DIAGNOSIS — Z191 Hormone sensitive malignancy status: Secondary | ICD-10-CM | POA: Diagnosis not present

## 2024-07-20 DIAGNOSIS — Z510 Encounter for antineoplastic radiation therapy: Secondary | ICD-10-CM | POA: Diagnosis not present

## 2024-07-20 DIAGNOSIS — C61 Malignant neoplasm of prostate: Secondary | ICD-10-CM | POA: Diagnosis not present

## 2024-07-20 LAB — RAD ONC ARIA SESSION SUMMARY
Course Elapsed Days: 36
Plan Fractions Treated to Date: 26
Plan Prescribed Dose Per Fraction: 2.5 Gy
Plan Total Fractions Prescribed: 28
Plan Total Prescribed Dose: 70 Gy
Reference Point Dosage Given to Date: 65 Gy
Reference Point Session Dosage Given: 2.5 Gy
Session Number: 26

## 2024-07-23 ENCOUNTER — Ambulatory Visit

## 2024-07-23 ENCOUNTER — Other Ambulatory Visit: Payer: Self-pay

## 2024-07-23 ENCOUNTER — Ambulatory Visit
Admission: RE | Admit: 2024-07-23 | Discharge: 2024-07-23 | Disposition: A | Source: Ambulatory Visit | Attending: Radiation Oncology

## 2024-07-23 DIAGNOSIS — Z191 Hormone sensitive malignancy status: Secondary | ICD-10-CM | POA: Diagnosis not present

## 2024-07-23 DIAGNOSIS — C61 Malignant neoplasm of prostate: Secondary | ICD-10-CM | POA: Diagnosis not present

## 2024-07-23 DIAGNOSIS — Z510 Encounter for antineoplastic radiation therapy: Secondary | ICD-10-CM | POA: Diagnosis not present

## 2024-07-23 LAB — RAD ONC ARIA SESSION SUMMARY
Course Elapsed Days: 39
Plan Fractions Treated to Date: 27
Plan Prescribed Dose Per Fraction: 2.5 Gy
Plan Total Fractions Prescribed: 28
Plan Total Prescribed Dose: 70 Gy
Reference Point Dosage Given to Date: 67.5 Gy
Reference Point Session Dosage Given: 2.5 Gy
Session Number: 27

## 2024-07-23 NOTE — Telephone Encounter (Signed)
 Spoke to patient and wife Dr.Skain's advice given.Wife stated 1 year ago Dr.Skain's increased Prazosin  2 mg to 4 tablets at night.He is taking for a sleep disorder and nightmares.She wanted to know can he go back to his previous dose of 3 tablets.He will stop Telmisartan .Advised I will send message to Dr.Skains for advice.

## 2024-07-24 ENCOUNTER — Other Ambulatory Visit: Payer: Self-pay

## 2024-07-24 ENCOUNTER — Ambulatory Visit

## 2024-07-24 ENCOUNTER — Ambulatory Visit
Admission: RE | Admit: 2024-07-24 | Discharge: 2024-07-24 | Disposition: A | Discharge status: Home / Self Care | Source: Ambulatory Visit | Attending: Radiation Oncology | Admitting: Radiation Oncology

## 2024-07-24 DIAGNOSIS — Z191 Hormone sensitive malignancy status: Secondary | ICD-10-CM | POA: Diagnosis not present

## 2024-07-24 DIAGNOSIS — C61 Malignant neoplasm of prostate: Secondary | ICD-10-CM

## 2024-07-24 DIAGNOSIS — Z510 Encounter for antineoplastic radiation therapy: Secondary | ICD-10-CM | POA: Diagnosis not present

## 2024-07-24 LAB — RAD ONC ARIA SESSION SUMMARY
Course Elapsed Days: 40
Plan Fractions Treated to Date: 28
Plan Prescribed Dose Per Fraction: 2.5 Gy
Plan Total Fractions Prescribed: 28
Plan Total Prescribed Dose: 70 Gy
Reference Point Dosage Given to Date: 70 Gy
Reference Point Session Dosage Given: 2.5 Gy
Session Number: 28

## 2024-07-24 NOTE — Telephone Encounter (Signed)
 Called patient left Dr.Skain's advice on personal voice mail.Advised to call back if he has any questions.

## 2024-07-25 ENCOUNTER — Ambulatory Visit

## 2024-07-25 NOTE — Radiation Completion Notes (Addendum)
" °  Radiation Oncology         [phone] ________________________________  Name: Anthony [name] MRN: [id number]  Date: 07/24/2024  DOB: 14-Jun-1951  Referring Physician: Sherwood Edison, M.D. Date of Service: 2024-07-25 Radiation Oncologist: Adina Barge, M.D. [facility]     RADIATION ONCOLOGY END OF TREATMENT NOTE     Diagnosis: 73 y.o. gentleman with Stage T1c adenocarcinoma of the prostate with Gleason score of 4+3, and PSA of 5.15.   Intent: Curative     ==========DELIVERED PLANS==========  First Treatment Date: 2024-06-14 Last Treatment Date: 2024-07-24   Plan Name: Prostate Site: Prostate Technique: IMRT Mode: Photon Dose Per Fraction: 2.5 Gy Prescribed Dose (Delivered / Prescribed): 70 Gy / 70 Gy Prescribed Fxs (Delivered / Prescribed): 28 / 28     ==========ON TREATMENT VISIT DATES========== 2024-06-14, 2024-06-22, 2024-07-01, 2024-07-04, 2024-07-13, 2024-07-20    See weekly On Treatment Notes in Epic for details in the Media tab (listed as Progress notes on the On Treatment Visit Dates listed above).   The patient will receive a call in about one month from the radiation oncology department. He will continue follow up with Dr. Edison as well.  ------------------------------------------------   [name], MD [facility]  Radiation Oncology Direct Dial: [phone]  Fax: [phone] [email or web address]  Skype  LinkedIn  "

## 2024-07-26 NOTE — Progress Notes (Signed)
 Patient was a RadOnc Consult on 04/25/24 for his stage T1c adenocarcinoma of the prostate with Gleason score of 4+3, and PSA of 5.15.  Patient proceed with treatment recommendations of 5.5 weeks of external beam therapy and had his final radiation treatment on 07/24/24.   Patient will be scheduled for a post treatment nurse call and has a follow up with urology on 08/13/24 for a lab and NP follow up 08/20/24.

## 2024-08-17 NOTE — Progress Notes (Signed)
" °  Radiation Oncology         (336) [phone] ________________________________  Name: Anthony [name] MRN: [id number]  Date of Service: 08/21/2024  DOB: April 25, 1951  Post Treatment Telephone Note  Diagnosis:  Malignant neoplasm of prostate   First Treatment Date: 2024-06-14 Last Treatment Date: 2024-07-24   Plan Name: Prostate Site: Prostate Technique: IMRT Mode: Photon Dose Per Fraction: 2.5 Gy Prescribed Dose (Delivered / Prescribed): 70 Gy / 70 Gy Prescribed Fxs (Delivered / Prescribed): 28 / 28   Pre Treatment IPSS Score: 14 (as documented in the provider consult note)  The patient {WAS/WAS NOT:[phone]::was not} available for call today.   Symptoms of fatigue {ACTIONS; HAVE/HAVE NOT:19434} improved since completing therapy.  Symptoms of bladder changes {ACTIONS; HAVE/HAVE WNU:80565} improved since completing therapy. Current symptoms include ***, and medications for bladder symptoms include ***.  Symptoms of bowel changes {ACTIONS; HAVE/HAVE NOT:19434} improved since completing therapy. Current symptoms include ***, and medications for bowel symptoms include ***.   Post Treatment IPSS Score: IPSS Questionnaire (AUA-7): Over the past month   1)  How often have you had a sensation of not emptying your bladder completely after you finish urinating?  {Rating:19227}  2)  How often have you had to urinate again less than two hours after you finished urinating? {Rating:19227}  3)  How often have you found you stopped and started again several times when you urinated?  {Rating:19227}  4) How difficult have you found it to postpone urination?  {Rating:19227}  5) How often have you had a weak urinary stream?  {Rating:19227}  6) How often have you had to push or strain to begin urination?  {Rating:19227}  7) How many times did you most typically get up to urinate from the time you went to bed until the time you got up in the morning?  {Rating:19228}  Total score:   {0-35:19561} Which indicates {Mild/Moderate/Severe:20260} symptoms  0-7 mildly symptomatic   8-19 moderately symptomatic   20-35 severely symptomatic   Patient (has a scheduled follow up visit with his urologist, Dr. Sherwood Edison, on ***/ does not currently have any scheduled follow up with his urologist to his knowledge so he was advised to call [facility] to schedule his post-treatment follow up with Dr. GARLON for ongoing surveillance. He was counseled that PSA levels will be drawn in the urology office, and was reassured that additional time is expected to improve bowel and bladder symptoms. He was encouraged to call back with concerns or questions regarding radiation.   "

## 2024-08-21 ENCOUNTER — Ambulatory Visit
Admission: RE | Admit: 2024-08-21 | Discharge: 2024-08-21 | Disposition: A | Discharge status: Home / Self Care | Source: Ambulatory Visit | Attending: Radiation Oncology | Admitting: Radiation Oncology

## 2024-08-21 DIAGNOSIS — C61 Malignant neoplasm of prostate: Secondary | ICD-10-CM

## 2024-08-27 ENCOUNTER — Encounter: Payer: Self-pay | Admitting: Pulmonary Disease

## 2024-08-27 ENCOUNTER — Ambulatory Visit: Admitting: Pulmonary Disease

## 2024-08-27 VITALS — BP 128/70 | HR 61 | Ht 67.0 in | Wt 200.0 lb

## 2024-08-27 DIAGNOSIS — I2699 Other pulmonary embolism without acute cor pulmonale: Secondary | ICD-10-CM

## 2024-08-27 DIAGNOSIS — R0602 Shortness of breath: Secondary | ICD-10-CM

## 2024-08-27 DIAGNOSIS — Z87891 Personal history of nicotine dependence: Secondary | ICD-10-CM

## 2024-08-27 DIAGNOSIS — G4733 Obstructive sleep apnea (adult) (pediatric): Secondary | ICD-10-CM | POA: Diagnosis not present

## 2024-08-27 NOTE — Progress Notes (Unsigned)
 "  @Patient  ID: Anthony [name], male    DOB: June 15, 1951, 74 y.o.   MRN: [id number]  Chief Complaint  Patient presents with   Medical Management of Chronic Issues    Pt states blood thinners, injections ?     Referring provider: Regino Slater, MD  HPI:   74 y.o. male with history of OSA whom we are seeing for evaluation of acute PE, recurrent, and possible underlying asthma.  Most recent pulmonary note reviewed.  Multiple pulmonary office notes reviewed.  Returns for follow-up scheduled by Dr. Neysa.  Note reviewed.  Adherence to Eliquis .  1 month now.  Breathing seems to be okay.  Did not use Symbicort  very much.  Upcoming prostate seed placement.  He has orthostatic hypotension.  He reports his oxygen  drops at home sometimes.  To the high 80s.  We have not documented any of that here in clinic.  His oxygen  saturations of all been in the mid 90s.  HPI initial visit: Patient with sleep apnea well-maintained on CPAP.  He had a history of unprovoked PE treated with anticoagulation some years ago.  After 6 months anticoagulation was stopped.  He said he had a workup with hematology etc. without clear cause.  Within the last 2 weeks been diagnosed with prostate cancer based on biopsy.  Has upcoming appointments with oncology, radiation oncology, surgeon.  Recently seen 03/09/2024 by pulmonologist with developing shortness of breath.  PFTs were ordered and he was started on Symbicort .  Symbicort  helped some.  PFTs not done.  But again over the last 2 to 3 weeks symptoms have worsened.  With tachycardia, hypoxemia to 85% with exertion.  Today in the room he does several squats up and down and his oxygen  saturation started at 9192% improved to 95% and then back down to 90% after exertion.  He had a PET scan today, the CT portion is reviewed that shows clear lungs with stable bibasilar atelectasis when compared to most recent cross-sectional imaging 05/2022.  He reports a negative CTA PE protocol  02/2024 in the setting of presyncope or syncope at the [facility].   Questionaires / Pulmonary Flowsheets:   ACT:      No data to display          MMRC:     No data to display          Epworth:      No data to display          Tests:   FENO:  No results found for: NITRICOXIDE  PFT:     No data to display          WALK:     05/23/2024    3:24 PM  SIX MIN WALK  Tech Comments: Patient was able to walk all three laps w/o the need for O2    Imaging: Personally reviewed and as per EMR in discussion this note No results found.   Lab Results: Personally reviewed CBC    Component Value Date/Time   WBC 6.4 05/18/2022 1151   RBC 4.43 05/18/2022 1151   HGB 13.9 05/18/2022 1151   HCT 41.8 05/18/2022 1151   PLT 200 05/18/2022 1151   MCV 94.4 05/18/2022 1151   MCH 31.4 05/18/2022 1151   MCHC 33.3 05/18/2022 1151   RDW 13.0 05/18/2022 1151   LYMPHSABS 2.5 05/18/2010 2231   MONOABS 0.5 05/18/2010 2231   EOSABS 0.2 05/18/2010 2231   BASOSABS 0.0 05/18/2010 2231    BMET  Component Value Date/Time   NA 137 05/18/2022 1151   NA 138 09/22/2021 1021   K 4.1 05/18/2022 1151   CL 105 05/18/2022 1151   CO2 25 05/18/2022 1151   GLUCOSE 109 (H) 05/18/2022 1151   BUN 19 05/18/2022 1151   BUN 13 09/22/2021 1021   CREATININE 0.97 05/18/2022 1151   CALCIUM  9.2 05/18/2022 1151   GFRNONAA 77.71 02/28/2024 1305   GFRAA 84 06/15/2019 1102    BNP No results found for: BNP  ProBNP No results found for: PROBNP  Specialty Problems       Pulmonary Problems   Obstructive sleep apnea   NPSG 10/01/12- AHI 16.2/ hr, weight 172 lbs, mild limb jerks       Chronic cough   Allergic rhinitis due to pollen    Allergies  Allergen Reactions   Bee Venom Anaphylaxis   Levofloxacin      Muscle tightness-told to never take it again  Other Reaction(s): Other (See Comments)  Muscle tightness-told to never take it again, , Muscle  tightness-told to never take it again Muscle tightness-told to never take it again  Other Reaction(s): Myalgia   Suvorexant Other (See Comments)    Other reaction(s): tongue,lip, face swelling  Other Reaction(s): Other (See Comments)  Other reaction(s): tongue,lip, face swelling, , Other reaction(s): tongue,lip, face swelling Other reaction(s): tongue,lip, face swelling  Other Reaction(s): Other (See Comments)    Other reaction(s): tongue,lip, face swelling  Other reaction(s): tongue,lip, face swelling Other reaction(s): tongue,lip, face swelling   Trazodone Other (See Comments)    Other reaction(s): prolonged erection  Other Reaction(s): Other (See Comments)  Other reaction(s): prolonged erection, , Other reaction(s): prolonged erection Other reaction(s): prolonged erection  Other Reaction(s): Other (See Comments)    Other reaction(s): prolonged erection  Other reaction(s): prolonged erection Other reaction(s): prolonged erection   Amoxicillin-Pot Clavulanate Rash and Dermatitis    Severe rash  Severe rash, , Severe rash Severe rash  Other Reaction(s): Unknown  Severe rash Severe rash   Nsaids Nausea Only    Gi upset   Tolmetin Nausea Only and Other (See Comments)    Gi upset  Other Reaction(s): GI Intolerance  Gi upset, , Gi upset Gi upset  Other Reaction(s): Other (See Comments)    Gi upset  Gi upset Gi upset    Immunization History  Administered Date(s) Administered   Fluzone Influenza virus vaccine,trivalent (IIV3), split virus 05/15/2013, 05/08/2014   H1N1 09/03/2008   INFLUENZA, HIGH DOSE SEASONAL PF 06/08/2018, 05/01/2019   Influenza Split 05/10/2007, 05/14/2009, 05/06/2010, 05/28/2011, 04/26/2012, 05/28/2015, 04/08/2017   Influenza,inj,quad, With Preservative 05/28/2015, 04/09/2016   PFIZER(Purple Top)SARS-COV-2 Vaccination 09/14/2019, 10/09/2019, 11/07/2020, 05/09/2021   Pneumococcal Conjugate-13 06/25/2015   Pneumococcal Polysaccharide-23  07/06/2016   Tdap 08/10/1995, 09/12/2008   Zoster Recombinant(Shingrix) 07/13/2018, 09/13/2018   Zoster, Live 09/30/2010, 06/09/2012    Past Medical History:  Diagnosis Date   Asbestos exposure 09/14/2016   Worked as a chief technology officer many years ago, disturbing asbestos insulation and also smoking at that time   Cancer [facility])    Prostate   Coronary artery disease involving native coronary artery of native heart without angina pectoris 09/22/2021   CTA of chest 02/2021 - coronary artery calcifications   Depression    Elevated PSA    GERD (gastroesophageal reflux disease)    Glaucoma    Headache(784.0)    Hypertension    Obstructive sleep apnea 12/20/2012   NPSG 10/01/12- AHI 16.2/ hr, weight 172 lbs, mild limb jerks CPAP auto>  9 cwp APS    Pre-diabetes    Psoriasis    Pulmonary embolism (HCC) 08/22/2017   Multiple pulmonary emboli identified on CTa chest 08/15/17 at [id number]. Report in Media.  Eliquis  begun 08/2017   Seasonal allergies    Sinus bradycardia 09/22/2021   ZIO 2020 normal, Avg HR 60's. Chronotropic competence   Sleep apnea    using a cpap-moderate    Tobacco History: Social History   Tobacco Use  Smoking Status Former   Current packs/day: 0.00   Average packs/day: 1 pack/day for 5.0 years (5.0 ttl pk-yrs)   Types: Cigarettes   Start date: 08/09/1965   Quit date: 08/09/1970   Years since quitting: 54.0  Smokeless Tobacco Never   Counseling given: Not Answered   Continue to not smoke  Outpatient Encounter Medications as of 08/27/2024  Medication Sig   ALPRAZolam  (XANAX ) 1 MG tablet Take 1 mg by mouth at bedtime as needed for anxiety. Take a 0.5 as needed   apixaban  (ELIQUIS ) 5 MG TABS tablet Take 1 tablet (5 mg total) by mouth 2 (two) times daily.   aspirin EC 81 MG tablet Take 81 mg by mouth daily.   budesonide -formoterol  (SYMBICORT ) 160-4.5 MCG/ACT inhaler Inhale 2 puffs, then rinse mouth, twice daily- maintenance    cetirizine (ZYRTEC) 10 MG tablet Take 10 mg by mouth daily.   Clobetasol Propionate (TEMOVATE) 0.05 % external spray Apply topically 2 (two) times daily.   EPINEPHrine 0.3 mg/0.3 mL IJ SOAJ injection    escitalopram  (LEXAPRO ) 20 MG tablet Take 1 tablet (20 mg total) by mouth daily.   ezetimibe  (ZETIA ) 10 MG tablet Take 1 tablet by mouth once daily   hydrOXYzine  (ATARAX ) 25 MG tablet Take 1 tablet (25 mg total) by mouth 3 (three) times daily as needed.   ketoconazole (NIZORAL) 2 % cream Apply 1 Application topically daily.   latanoprost (XALATAN) 0.005 % ophthalmic solution Place 1 drop into both eyes at bedtime. 1 drop OU HS   leflunomide (ARAVA) 10 MG tablet Take 10 mg by mouth daily. (Patient taking differently: Take 10 mg by mouth daily. On hold)   Melatonin 5 MG CAPS Take 5 mg by mouth at bedtime.   MOUNJARO 5 MG/0.5ML Pen SMARTSIG:11.9 SUB-Q Once a Week   Multiple Vitamins-Minerals (PRESERVISION AREDS PO) Take 1 capsule by mouth daily.   omeprazole (PRILOSEC) 40 MG capsule Take 40 mg by mouth daily.   prazosin  (MINIPRESS ) 2 MG capsule Take 3 capsules at bedtime   predniSONE  (DELTASONE ) 5 MG tablet 5 mg daily. Takes 5 mg total; 1/2 in AM & 1/2 in PM (Patient taking differently: 2.5 mg daily. Takes 2.5 mg total;)   QUEtiapine  (SEROQUEL  XR) 50 MG TB24 24 hr tablet Take 1 tablet (50 mg total) by mouth at bedtime.   rosuvastatin  (CRESTOR ) 20 MG tablet TAKE 1 TABLET BY MOUTH EVERY DAY   tacrolimus (PROTOPIC) 0.1 % ointment Apply topically as needed.   valACYclovir (VALTREX) 500 MG tablet as needed.   No facility-administered encounter medications on file as of 08/27/2024.     Review of Systems  Review of Systems  N/a Physical Exam  BP 128/70   Pulse 61   Ht 5' 7 (1.702 m) Comment: per pt  Wt 200 lb (90.7 kg)   SpO2 95%   BMI 31.32 kg/m   Wt Readings from Last 5 Encounters:  08/27/24 200 lb (90.7 kg)  06/01/24 200 lb (90.7 kg)  05/23/24 208 lb (94.3 kg)   05/08/24 203  lb 12.8 oz (92.4 kg)  04/25/24 (P) 202 lb (91.6 kg)    BMI Readings from Last 5 Encounters:  08/27/24 31.32 kg/m  06/01/24 31.32 kg/m  05/23/24 32.58 kg/m  05/08/24 31.92 kg/m  04/25/24 (P) 31.64 kg/m     Physical Exam General: Sitting in chair, no acute distress Eyes: EOMI, no icterus Neck: Supple, JVP Pulmonary: Clear, normal work of breathing Cardiovascular: Warm, no edema,  Abdomen: None Neuro: Normal gait, no weakness Psych: Normal mood, full affect  Assessment & Plan:   Pulmonary embolism, acute: 2 episodes.  Seems possibly provoked by preceding back injections of steroids.  Although not really a clear inciting event.  With RV unprovoked.  Given multiple episodes recommend indefinite anticoagulation.  Continue Eliquis  5 mg twice daily.  Alter anticoagulation plan in the future pending invasive procedures.  Would advocate for Lovenox bridge in the future given recurrent VTE.  Potential asthma: Prescribed Symbicort  03/2024 workup shortness of breath.  No longer using.  Does not feel like dyspnea is particularly severe.  OSA on CPAP: Compliance report reviewed, excellent adherence, using on average nearly 9 hours, using every day.  Auto titrating 10 to 20 cm of water.  AHI is 2.6.  He is benefiting clinically from CPAP use and he is encouraged to continue this.  Return in about 6 months (around 02/24/2025) for f/u Dr. Annella.   Donnice JONELLE Annella, MD 08/27/2024    "

## 2024-08-27 NOTE — Patient Instructions (Signed)
 Great to see you again  Continue the apixaban , Eliquis  twice a day.  If there are any plans for more invasive procedures and questions about the blood thinners arise, please let me know I am happy to give my input.  Return to clinic in 6 months or sooner as needed with Dr. Annella

## 2024-09-05 ENCOUNTER — Telehealth: Payer: Self-pay

## 2024-09-05 ENCOUNTER — Other Ambulatory Visit: Payer: Self-pay | Admitting: Orthopedic Surgery

## 2024-09-05 ENCOUNTER — Telehealth: Payer: Self-pay | Admitting: Cardiology

## 2024-09-05 NOTE — Telephone Encounter (Signed)
"  °  Patient Consent for Virtual Visit         Anthony [name] has provided verbal consent on 09/05/2024 for a virtual visit (video or telephone).  Appointment is scheduled for 09/13/24 at 9am.   CONSENT FOR VIRTUAL VISIT FOR:  Anthony [name]  By participating in this virtual visit I agree to the following:  I hereby voluntarily request, consent and authorize [facility] and its employed or contracted physicians, physician assistants, nurse practitioners or other licensed health care professionals (the Practitioner), to provide me with telemedicine health care services (the Services) as deemed necessary by the treating Practitioner. I acknowledge and consent to receive the Services by the Practitioner via telemedicine. I understand that the telemedicine visit will involve communicating with the Practitioner through live audiovisual communication technology and the disclosure of certain medical information by electronic transmission. I acknowledge that I have been given the opportunity to request an in-person assessment or other available alternative prior to the telemedicine visit and am voluntarily participating in the telemedicine visit.  I understand that I have the right to withhold or withdraw my consent to the use of telemedicine in the course of my care at any time, without affecting my right to future care or treatment, and that the Practitioner or I may terminate the telemedicine visit at any time. I understand that I have the right to inspect all information obtained and/or recorded in the course of the telemedicine visit and may receive copies of available information for a reasonable fee.  I understand that some of the potential risks of receiving the Services via telemedicine include:  Delay or interruption in medical evaluation due to technological equipment failure or disruption; Information transmitted may not be sufficient (e.g. poor resolution of images) to allow  for appropriate medical decision making by the Practitioner; and/or  In rare instances, security protocols could fail, causing a breach of personal health information.  Furthermore, I acknowledge that it is my responsibility to provide information about my medical history, conditions and care that is complete and accurate to the best of my ability. I acknowledge that Practitioner's advice, recommendations, and/or decision may be based on factors not within their control, such as incomplete or inaccurate data provided by me or distortions of diagnostic images or specimens that may result from electronic transmissions. I understand that the practice of medicine is not an exact science and that Practitioner makes no warranties or guarantees regarding treatment outcomes. I acknowledge that a copy of this consent can be made available to me via my patient portal [facility] MyChart), or I can request a printed copy by calling the office of [facility].    I understand that my insurance will be billed for this visit.   I have read or had this consent read to me. I understand the contents of this consent, which adequately explains the benefits and risks of the Services being provided via telemedicine.  I have been provided ample opportunity to ask questions regarding this consent and the Services and have had my questions answered to my satisfaction. I give my informed consent for the services to be provided through the use of telemedicine in my medical care    "

## 2024-09-05 NOTE — Telephone Encounter (Signed)
 Patient called back. Appointment is scheduled for 09/13/24 at 9am. Med req and consent are complete. Call patient at [phone]

## 2024-09-05 NOTE — Telephone Encounter (Signed)
" ° °  Name: Anthony [name]  DOB: 05-12-1951  MRN: [id number]  Primary Cardiologist: Oneil Parchment, MD   Preoperative team, please contact this patient and set up a phone call appointment for further preoperative risk assessment. Please obtain consent and complete medication review. Thank you for your help.  I confirm that guidance regarding antiplatelet and oral anticoagulation therapy has been completed and, if necessary, noted below. - Patient is on Eliquis  for recurrent PE and this is managed by Pulmonology. Therefore, would defer to them for recommendations on holding this.   I also confirmed the patient resides in the state of [place] . As per [facility] Medical Board telemedicine laws, the patient must reside in the state in which the provider is licensed.   Brallan Denio E Kylii Ennis, PA-C 09/05/2024, 10:39 AM [facility]    "

## 2024-09-05 NOTE — Telephone Encounter (Signed)
"  ° °  Pre-operative Risk Assessment    Patient Name: Anthony [name]  DOB: Sep 26, 1950 MRN: [id number]   Date of last office visit: 03/13/24 Date of next office visit: 11/30/24   Request for Surgical Clearance    Procedure:  Right ulnar nerve decompression possible transposition   Date of Surgery:  Clearance 09/27/24                                Surgeon:  Dr. Franky Curia  Surgeon's Group or Practice Name:  [facility]  Phone number:  [phone] Fax number:  [phone]   Type of Clearance Requested:   - Medical  - Pharmacy:  Hold Apixaban  (Eliquis ) defer to cardiology    Type of Anesthesia:  auxiliary block    Additional requests/questions:    Bonney Larraine Salt   09/05/2024, 10:23 AM   "

## 2024-09-05 NOTE — Telephone Encounter (Signed)
 Called patient, NA, left a message for him to contact our office to schedule a telehealth visit for cardiac clearance.

## 2024-09-07 ENCOUNTER — Telehealth: Payer: Self-pay | Admitting: Pulmonary Disease

## 2024-09-07 NOTE — Telephone Encounter (Unsigned)
 Copied from CRM [id number]. Topic: Clinical - Medical Advice >> Sep 07, 2024  4:07 PM Leila C wrote: Reason for CRM: Patient's spouse Romero [phone] states patient is having sleep study 09/10/24 night at [facility], and needs the cpap parameter. Informed Romero, the office is closed today. Please advise and call back.

## 2024-09-12 ENCOUNTER — Other Ambulatory Visit (HOSPITAL_COMMUNITY): Payer: Self-pay | Admitting: Neurology

## 2024-09-12 DIAGNOSIS — R4189 Other symptoms and signs involving cognitive functions and awareness: Secondary | ICD-10-CM

## 2024-09-13 ENCOUNTER — Ambulatory Visit: Admitting: Physician Assistant

## 2024-09-13 ENCOUNTER — Ambulatory Visit: Payer: Self-pay

## 2024-09-13 DIAGNOSIS — Z01810 Encounter for preprocedural cardiovascular examination: Secondary | ICD-10-CM

## 2024-09-13 NOTE — Telephone Encounter (Signed)
 Patient to stop Eliquis  48 hours prior to procedure.  Last dose evening 09/24/2024.  I recommend he take Lovenox 1 mg/kg twice daily on 2/17 and 2/18.  Last dose 2/18 PM.  Eliquis  should be resumed ASAP after procedure at the discretion of the surgeon, ideally the evening of the surgery or the next morning.  But if needs to be held longer, defer to surgeon.

## 2024-09-13 NOTE — Progress Notes (Signed)
 "   Virtual Visit via Telephone Note   Because of Anthony [name] co-morbid illnesses, he is at least at moderate risk for complications without adequate follow up.  This format is felt to be most appropriate for this patient at this time.  Due to technical limitations with video connection (technology), today's appointment will be conducted as an audio only telehealth visit, and Anthony [name] verbally agreed to proceed in this manner.   All issues noted in this document were discussed and addressed.  No physical exam could be performed with this format.  Evaluation Performed:  Preoperative cardiovascular risk assessment _____________   Date:  09/13/2024   Patient ID:  Anthony [name], Anthony [name] [birth year], MRN [id number] Patient Location:  Home Provider location:   Office  Primary Care Provider:  Regino Slater, MD Primary Cardiologist:  Oneil Parchment, MD  Chief Complaint / Patient Profile   74 y.o. y/o male with a h/o syncope, orthostatic hypotension, non-obstructive CAD, and asbestos exposure who is pending Right ulnar nerve decompression possible transposition and presents today for telephonic preoperative cardiovascular risk assessment.  History of Present Illness    Anthony [name] is a 74 y.o. male who presents via audio/video conferencing for a telehealth visit today.  Pt was last seen in cardiology clinic on 03/13/24 by Glendia Ferrier, PA-C.  At that time Anthony [name] was doing well.  The patient is now pending procedure as outlined above. Since his last visit, he states he has not had many CV symptoms. Nothing holds him back. Blood pressure has been stable recently off his ARB. He enjoys golf, but has not done much in the last few months. He has gone through radiation for prostate CA, which has slowed him down some. He does meet minimum mets.   I confirm that guidance regarding antiplatelet and oral anticoagulation therapy has been completed and, if necessary,  noted below. - Patient is on Eliquis  for recurrent PE and this is managed by Pulmonology. Therefore, would defer to them for recommendations on holding this.   Past Medical History    Past Medical History:  Diagnosis Date   Asbestos exposure 09/14/2016   Worked as a chief technology officer many years ago, disturbing asbestos insulation and also smoking at that time   Cancer [facility])    Prostate   Coronary artery disease involving native coronary artery of native heart without angina pectoris 09/22/2021   CTA of chest 02/2021 - coronary artery calcifications   Depression    Elevated PSA    GERD (gastroesophageal reflux disease)    Glaucoma    Headache(784.0)    Hypertension    Obstructive sleep apnea 12/20/2012   NPSG 10/01/12- AHI 16.2/ hr, weight 172 lbs, mild limb jerks CPAP auto> 9 cwp APS    Pre-diabetes    Psoriasis    Pulmonary embolism (HCC) 08/22/2017   Multiple pulmonary emboli identified on CTa chest 08/15/17 at [facility]. Report in Media.  Eliquis  begun 08/2017   Seasonal allergies    Sinus bradycardia 09/22/2021   ZIO 2020 normal, Avg HR 60's. Chronotropic competence   Sleep apnea    using a cpap-moderate   Past Surgical History:  Procedure Laterality Date   APPENDECTOMY     CARPAL TUNNEL WITH CUBITAL TUNNEL  08/09/2006   right   CARPAL TUNNEL WITH CUBITAL TUNNEL  08/10/2007   and ulnar nerve-left   ESOPHAGEAL MANOMETRY N/A 10/25/2018   Procedure: ESOPHAGEAL MANOMETRY (EM);  Surgeon: Dianna Specking, MD;  Location:  [facility];  Service: Endoscopy;  Laterality: N/A;   EXTERNAL EAR SURGERY     ears reset surgically age 50   GOLD SEED IMPLANT N/A 06/01/2024   Procedure: INSERTION, GOLD SEEDS;  Surgeon: Selma Donnice SAUNDERS, MD;  Location: [facility] OR;  Service: Urology;  Laterality: N/A;   NAILBED REPAIR Right 05/08/2021   Procedure: RIGHT LONG FINGER NAIL UNIT BIOPSY;  Surgeon: Murrell Drivers, MD;  Location: [facility];  Service: Orthopedics;  Laterality:  Right;   PH IMPEDANCE STUDY N/A 10/25/2018   Procedure: PH IMPEDANCE STUDY;  Surgeon: Dianna Specking, MD;  Location: [facility];  Service: Endoscopy;  Laterality: N/A;   PROSTATE BIOPSY     RETINAL DETACHMENT SURGERY  08/09/2002   right   RETINAL DETACHMENT SURGERY  08/09/2000   left   SHOULDER ARTHROSCOPY  08/09/2004   left   SPACE OAR INSTILLATION N/A 06/01/2024   Procedure: INJECTION, HYDROGEL SPACER;  Surgeon: Selma Donnice SAUNDERS, MD;  Location: [facility] OR;  Service: Urology;  Laterality: N/A;   TONSILLECTOMY     TRIGGER FINGER RELEASE Left 02/15/2013   Procedure: RELEASE TRIGGER FINGER/A-1 PULLEY LEFT LONG FINGER;  Surgeon: Lamar LULLA Leonor Mickey., MD;  Location: [facility];  Service: Orthopedics;  Laterality: Left;   TRIGGER FINGER RELEASE Left 09/06/2013   Procedure: RELEASE TRIGGER FINGER/A-1 PULLEY;  Surgeon: Lamar LULLA Leonor Mickey., MD;  Location: [facility];  Service: Orthopedics;  Laterality: Left;   TRIGGER FINGER RELEASE Right 08/05/2014   Procedure: RIGHT SMALL TRIGGER RELEASE ;  Surgeon: Drivers Murrell, MD;  Location: [facility];  Service: Orthopedics;  Laterality: Right;   ULNAR NERVE TRANSPOSITION Left 09/06/2013   Procedure: LEFT ANTERIOR SUBCUTANEOUS TRANSPOSITION;  Surgeon: Lamar LULLA Leonor Mickey., MD;  Location:  SURGERY CENTER;  Service: Orthopedics;  Laterality: Left;    Allergies  Allergies[1]  Home Medications    Prior to Admission medications  Medication Sig Start Date End Date Taking? Authorizing Provider  ALPRAZolam  (XANAX ) 1 MG tablet Take 1 mg by mouth at bedtime as needed for anxiety. Take a 0.5 as needed    [provider]  apixaban  (ELIQUIS ) 5 MG TABS tablet Take 1 tablet (5 mg total) by mouth 2 (two) times daily. 05/23/24   Hunsucker, Donnice SAUNDERS, MD  aspirin EC 81 MG tablet Take 81 mg by mouth daily.    [provider]  budesonide -formoterol  (SYMBICORT ) 160-4.5 MCG/ACT inhaler Inhale 2 puffs, then  rinse mouth, twice daily- maintenance 03/09/24   Neysa Rama D, MD  cetirizine (ZYRTEC) 10 MG tablet Take 10 mg by mouth daily.    [provider]  Clobetasol Propionate (TEMOVATE) 0.05 % external spray Apply topically 2 (two) times daily.    [provider]  EPINEPHrine 0.3 mg/0.3 mL IJ SOAJ injection  07/24/18   [provider]  escitalopram  (LEXAPRO ) 20 MG tablet Take 1 tablet (20 mg total) by mouth daily. 06/11/24   Teresa Redell LABOR, NP  ezetimibe  (ZETIA ) 10 MG tablet Take 1 tablet by mouth once daily 03/29/24   Jeffrie Oneil BROCKS, MD  hydrOXYzine  (ATARAX ) 25 MG tablet Take 1 tablet (25 mg total) by mouth 3 (three) times daily as needed. 06/11/24   Teresa Redell LABOR, NP  ketoconazole (NIZORAL) 2 % cream Apply 1 Application topically daily.    [provider]  latanoprost (XALATAN) 0.005 % ophthalmic solution Place 1 drop into both eyes at bedtime. 1 drop OU HS 06/21/17   [provider]  leflunomide (ARAVA) 10 MG tablet Take 10 mg by mouth daily. Patient taking differently: Take 10 mg by mouth daily. On hold    [provider]  Melatonin 5 MG CAPS Take 5 mg by mouth at bedtime.    [provider]  MOUNJARO 5 MG/0.5ML Pen SMARTSIG:11.9 SUB-Q Once a Week 08/20/24   [provider]  Multiple Vitamins-Minerals (PRESERVISION AREDS PO) Take 1 capsule by mouth daily.    [provider]  omeprazole (PRILOSEC) 40 MG capsule Take 40 mg by mouth daily.    [provider]  prazosin  (MINIPRESS ) 2 MG capsule Take 3 capsules at bedtime 07/24/24   Jeffrie Oneil BROCKS, MD  predniSONE  (DELTASONE ) 5 MG tablet 5 mg daily. Takes 5 mg total; 1/2 in AM & 1/2 in PM Patient taking differently: 2.5 mg daily. Takes 2.5 mg total; 08/15/18   [provider]  QUEtiapine  (SEROQUEL  XR) 50 MG TB24 24 hr tablet Take 1 tablet (50 mg total) by mouth at bedtime. 06/11/24   Teresa Redell LABOR, NP  rosuvastatin  (CRESTOR ) 20 MG tablet TAKE 1 TABLET BY MOUTH  EVERY DAY 07/20/24   Jeffrie Oneil BROCKS, MD  tacrolimus (PROTOPIC) 0.1 % ointment Apply topically as needed.    [provider]  valACYclovir (VALTREX) 500 MG tablet as needed. 07/05/19   [provider]    Physical Exam    Vital Signs:  Anthony [name] does not have vital signs available for review today.  134/81, 54 pulse  Given telephonic nature of communication, physical exam is limited. AAOx3. NAD. Normal affect.  Speech and respirations are unlabored.  Accessory Clinical Findings    None  Assessment & Plan    1.  Preoperative Cardiovascular Risk Assessment:  Anthony [name] perioperative risk of a major cardiac event is 0.4% according to the Revised Cardiac Risk Index (RCRI).  Therefore, he is at low risk for perioperative complications.   His functional capacity is good at 5.62 METs according to the [place] Activity Status Index (DASI). Recommendations: According to ACC/AHA guidelines, no further cardiovascular testing needed.  The patient may proceed to surgery at acceptable risk.   Antiplatelet and/or Anticoagulation Recommendations: Eliquis  recommendations will need to come from Pulmonary.   The patient was advised that if he develops new symptoms prior to surgery to contact our office to arrange for a follow-up visit, and he verbalized understanding.  A copy of this note will be routed to requesting surgeon.  Time:   Today, I have spent 10 minutes with the patient with telehealth technology discussing medical history, symptoms, and management plan.     Anthony LOISE Fabry, PA-C  09/13/2024, 9:04 AM     [1]  Allergies Allergen Reactions   Bee Venom Anaphylaxis   Levofloxacin      Muscle tightness-told to never take it again  Other Reaction(s): Other (See Comments)  Muscle tightness-told to never take it again, , Muscle tightness-told to never take it again Muscle tightness-told to never take it again  Other Reaction(s): Myalgia   Suvorexant Other  (See Comments)    Other reaction(s): tongue,lip, face swelling  Other Reaction(s): Other (See Comments)  Other reaction(s): tongue,lip, face swelling, , Other reaction(s): tongue,lip, face swelling Other reaction(s): tongue,lip, face swelling  Other Reaction(s): Other (See Comments)    Other reaction(s): tongue,lip, face swelling  Other reaction(s): tongue,lip, face swelling Other reaction(s): tongue,lip, face swelling   Trazodone Other (See Comments)    Other reaction(s): prolonged erection  Other Reaction(s): Other (See Comments)  Other reaction(s): prolonged erection, , Other reaction(s): prolonged erection Other reaction(s): prolonged erection  Other Reaction(s): Other (See Comments)    Other reaction(s): prolonged erection  Other reaction(s): prolonged erection Other reaction(s): prolonged erection   Amoxicillin-Pot Clavulanate Rash and Dermatitis    Severe rash  Severe rash, , Severe rash Severe rash  Other Reaction(s): Unknown  Severe rash Severe rash   Nsaids Nausea Only    Gi upset   Tolmetin Nausea Only and Other (See Comments)    Gi upset  Other Reaction(s): GI Intolerance  Gi upset, , Gi upset Gi upset  Other Reaction(s): Other (See Comments)    Gi upset  Gi upset Gi upset   "

## 2024-09-13 NOTE — Telephone Encounter (Signed)
 Message from [place] A sent at 09/13/2024  2:39 PM EST  Reason for Triage: pt is scheduled for outpatient surgery on 09/27/24, right ulnar nerve decompression/transposition - pt takes ELIQUIS  -  Dr. Annella asked pt to inform us  when he would be scheduled for this surgery so he could make any changes to his blood thinner dosing/medication

## 2024-09-14 ENCOUNTER — Other Ambulatory Visit: Payer: Self-pay | Admitting: Urology

## 2024-09-14 DIAGNOSIS — C61 Malignant neoplasm of prostate: Secondary | ICD-10-CM

## 2024-09-14 NOTE — Telephone Encounter (Signed)
 Spoke with pt's wife, she was in grocery store and unable to talk, recommended to call pt. Called pt, asked him about instructions if anyone reviewed them with him. Gave message from Dr. Annella, pt verbalized understanding. Sent mychart message as well. Pt asking about Lovenox if that will be sent to pharmacy. Will send back to provider for review.

## 2024-09-17 ENCOUNTER — Ambulatory Visit (HOSPITAL_COMMUNITY)

## 2024-09-18 ENCOUNTER — Ambulatory Visit: Admitting: Behavioral Health

## 2024-09-27 ENCOUNTER — Ambulatory Visit (HOSPITAL_BASED_OUTPATIENT_CLINIC_OR_DEPARTMENT_OTHER): Admit: 2024-09-27 | Admitting: Orthopedic Surgery

## 2024-09-27 ENCOUNTER — Encounter (HOSPITAL_BASED_OUTPATIENT_CLINIC_OR_DEPARTMENT_OTHER): Payer: Self-pay

## 2024-11-30 ENCOUNTER — Ambulatory Visit: Admitting: Cardiology
